# Patient Record
Sex: Female | Born: 1945
Health system: Southern US, Community
[De-identification: ages and names within clinical notes are randomized; demographics above are authoritative.]

## PROBLEM LIST (undated history)

## (undated) DIAGNOSIS — H9319 Tinnitus, unspecified ear: Secondary | ICD-10-CM

## (undated) DIAGNOSIS — I350 Nonrheumatic aortic (valve) stenosis: Secondary | ICD-10-CM

## (undated) DIAGNOSIS — E785 Hyperlipidemia, unspecified: Secondary | ICD-10-CM

## (undated) DIAGNOSIS — E049 Nontoxic goiter, unspecified: Secondary | ICD-10-CM

## (undated) DIAGNOSIS — K219 Gastro-esophageal reflux disease without esophagitis: Secondary | ICD-10-CM

## (undated) DIAGNOSIS — G4733 Obstructive sleep apnea (adult) (pediatric): Secondary | ICD-10-CM

## (undated) DIAGNOSIS — M170 Bilateral primary osteoarthritis of knee: Secondary | ICD-10-CM

## (undated) DIAGNOSIS — I2510 Atherosclerotic heart disease of native coronary artery without angina pectoris: Secondary | ICD-10-CM

## (undated) HISTORY — PX: DILATION AND CURETTAGE OF UTERUS: SHX78

## (undated) HISTORY — DX: Bilateral primary osteoarthritis of knee: M17.0

## (undated) HISTORY — DX: Hyperlipidemia, unspecified: E78.5

## (undated) HISTORY — DX: Obstructive sleep apnea (adult) (pediatric): G47.33

## (undated) HISTORY — PX: TONSILLECTOMY: SUR1361

---

## 2012-06-19 DIAGNOSIS — Z23 Encounter for immunization: Secondary | ICD-10-CM | POA: Diagnosis not present

## 2012-08-11 DIAGNOSIS — I10 Essential (primary) hypertension: Secondary | ICD-10-CM | POA: Diagnosis not present

## 2012-08-11 DIAGNOSIS — S4980XA Other specified injuries of shoulder and upper arm, unspecified arm, initial encounter: Secondary | ICD-10-CM | POA: Diagnosis not present

## 2012-08-11 DIAGNOSIS — M25519 Pain in unspecified shoulder: Secondary | ICD-10-CM | POA: Diagnosis not present

## 2012-08-11 DIAGNOSIS — E789 Disorder of lipoprotein metabolism, unspecified: Secondary | ICD-10-CM | POA: Diagnosis not present

## 2012-08-12 DIAGNOSIS — M19019 Primary osteoarthritis, unspecified shoulder: Secondary | ICD-10-CM | POA: Diagnosis not present

## 2012-08-12 DIAGNOSIS — M1710 Unilateral primary osteoarthritis, unspecified knee: Secondary | ICD-10-CM | POA: Diagnosis not present

## 2012-08-12 DIAGNOSIS — M25519 Pain in unspecified shoulder: Secondary | ICD-10-CM | POA: Diagnosis not present

## 2012-08-12 DIAGNOSIS — M25569 Pain in unspecified knee: Secondary | ICD-10-CM | POA: Diagnosis not present

## 2012-09-08 DIAGNOSIS — IMO0002 Reserved for concepts with insufficient information to code with codable children: Secondary | ICD-10-CM | POA: Diagnosis not present

## 2012-09-18 DIAGNOSIS — H2510 Age-related nuclear cataract, unspecified eye: Secondary | ICD-10-CM | POA: Diagnosis not present

## 2012-09-18 DIAGNOSIS — H5210 Myopia, unspecified eye: Secondary | ICD-10-CM | POA: Diagnosis not present

## 2012-09-18 DIAGNOSIS — H52229 Regular astigmatism, unspecified eye: Secondary | ICD-10-CM | POA: Diagnosis not present

## 2012-09-18 DIAGNOSIS — H43819 Vitreous degeneration, unspecified eye: Secondary | ICD-10-CM | POA: Diagnosis not present

## 2012-09-18 DIAGNOSIS — M67919 Unspecified disorder of synovium and tendon, unspecified shoulder: Secondary | ICD-10-CM | POA: Diagnosis not present

## 2012-09-18 DIAGNOSIS — M719 Bursopathy, unspecified: Secondary | ICD-10-CM | POA: Diagnosis not present

## 2012-09-18 DIAGNOSIS — S40019A Contusion of unspecified shoulder, initial encounter: Secondary | ICD-10-CM | POA: Diagnosis not present

## 2012-11-01 DIAGNOSIS — M67919 Unspecified disorder of synovium and tendon, unspecified shoulder: Secondary | ICD-10-CM | POA: Diagnosis not present

## 2013-01-08 DIAGNOSIS — M67919 Unspecified disorder of synovium and tendon, unspecified shoulder: Secondary | ICD-10-CM | POA: Diagnosis not present

## 2013-01-13 DIAGNOSIS — M67919 Unspecified disorder of synovium and tendon, unspecified shoulder: Secondary | ICD-10-CM | POA: Diagnosis not present

## 2013-01-20 DIAGNOSIS — M719 Bursopathy, unspecified: Secondary | ICD-10-CM | POA: Diagnosis not present

## 2013-01-22 DIAGNOSIS — M719 Bursopathy, unspecified: Secondary | ICD-10-CM | POA: Diagnosis not present

## 2013-01-27 DIAGNOSIS — M67919 Unspecified disorder of synovium and tendon, unspecified shoulder: Secondary | ICD-10-CM | POA: Diagnosis not present

## 2013-01-29 DIAGNOSIS — M67919 Unspecified disorder of synovium and tendon, unspecified shoulder: Secondary | ICD-10-CM | POA: Diagnosis not present

## 2013-02-01 DIAGNOSIS — M67919 Unspecified disorder of synovium and tendon, unspecified shoulder: Secondary | ICD-10-CM | POA: Diagnosis not present

## 2013-02-13 DIAGNOSIS — S40019A Contusion of unspecified shoulder, initial encounter: Secondary | ICD-10-CM | POA: Diagnosis not present

## 2013-02-13 DIAGNOSIS — M719 Bursopathy, unspecified: Secondary | ICD-10-CM | POA: Diagnosis not present

## 2013-03-03 DIAGNOSIS — M719 Bursopathy, unspecified: Secondary | ICD-10-CM | POA: Diagnosis not present

## 2013-03-05 DIAGNOSIS — M719 Bursopathy, unspecified: Secondary | ICD-10-CM | POA: Diagnosis not present

## 2013-03-10 DIAGNOSIS — M7660 Achilles tendinitis, unspecified leg: Secondary | ICD-10-CM | POA: Diagnosis not present

## 2013-03-10 DIAGNOSIS — G4730 Sleep apnea, unspecified: Secondary | ICD-10-CM | POA: Diagnosis not present

## 2013-04-09 ENCOUNTER — Encounter: Payer: Self-pay | Admitting: Pulmonary Disease

## 2013-04-09 ENCOUNTER — Ambulatory Visit (INDEPENDENT_AMBULATORY_CARE_PROVIDER_SITE_OTHER): Payer: Medicare Other | Admitting: Pulmonary Disease

## 2013-04-09 VITALS — BP 130/74 | HR 83 | Temp 98.1°F | Ht 61.0 in | Wt 259.6 lb

## 2013-04-09 DIAGNOSIS — G4733 Obstructive sleep apnea (adult) (pediatric): Secondary | ICD-10-CM | POA: Insufficient documentation

## 2013-04-09 DIAGNOSIS — M67919 Unspecified disorder of synovium and tendon, unspecified shoulder: Secondary | ICD-10-CM | POA: Diagnosis not present

## 2013-04-09 DIAGNOSIS — M719 Bursopathy, unspecified: Secondary | ICD-10-CM | POA: Diagnosis not present

## 2013-04-09 DIAGNOSIS — M7660 Achilles tendinitis, unspecified leg: Secondary | ICD-10-CM | POA: Diagnosis not present

## 2013-04-09 NOTE — Progress Notes (Signed)
Subjective:    Patient ID: Debra Knight, female    DOB: [birth year], 67 y.o.   MRN: [id number]  HPI PCP - Judge Stall  67 year old woman presents for evaluation of obstructive sleep apnea. She is just moved from [place] to [facility]  about 3 years ago. Husband reports loud snoring worse over the last 3 years. She has always been a mouth breather. She reports excessive daytime napping.. She states that she has never enjoyed sleeping and does not feel rested lately.  Epworth sleepiness score is 13/24 and she report sleepiness as a passenger in a car for while watching TV. Bedtime is anywhere from 11 PM to 1 AM. The TV stays on through the night. Sleep latency is about an hour, she sleeps at the back rest and a memory foam pillow. The dog also sleeps on their bed. She has frequent nocturnal awakenings including bathroom visits x2 for nocturia. She is often up to 47 but stays in bed until 9 and finally is out of bed feeling tired with an occasional headache. There is no history suggestive of cataplexy, sleep paralysis or parasomnias She drinks decaffeinated sodas. Home study showed total sleep time of 4.5 hours with AHI of 34 events per hour and lowest desaturation of 72%  Past Medical History  Diagnosis Date  . Hyperlipidemia     No past surgical history on file.  Allergies  Allergen Reactions  . Cortisone     Adverse reaction  . Demerol (Meperidine)     vomiting  . Penicillins     Tongue swelling    History   Social History  . Marital Status: Married    Spouse Name: N/A    Number of Children: N/A  . Years of Education: N/A   Occupational History  . self employed    Social History Main Topics  . Smoking status: Former Smoker -- 2.00 packs/day for 15 years    Quit date: 09/03/1968  . Smokeless tobacco: Not on file     Comment: 1-2 ppd.   . Alcohol Use: No  . Drug Use: No  . Sexually Active: Not on file   Other Topics Concern  . Not on file   Social History Narrative   . No narrative on file    Family History  Problem Relation Age of Onset  . Prostate cancer Father   . Heart disease Mother   . Diabetes Maternal Grandfather   . Diabetes Maternal Uncle        Review of Systems  Constitutional: Negative for fever and unexpected weight change.  HENT: Positive for congestion. Negative for ear pain, nosebleeds, sore throat, rhinorrhea, sneezing, trouble swallowing, dental problem, postnasal drip and sinus pressure.   Eyes: Negative for redness and itching.  Respiratory: Negative for cough, chest tightness, shortness of breath and wheezing.   Cardiovascular: Negative for palpitations and leg swelling.  Gastrointestinal: Negative for nausea and vomiting.  Genitourinary: Negative for dysuria.  Musculoskeletal: Positive for arthralgias. Negative for joint swelling.  Skin: Negative for rash.  Neurological: Negative for headaches.  Hematological: Does not bruise/bleed easily.  Psychiatric/Behavioral: Negative for dysphoric mood. The patient is not nervous/anxious.        Objective:   Physical Exam  Gen. Pleasant, obese, in no distress, normal affect ENT - no lesions, no post nasal drip, class 2-3 airway Neck: No JVD, no thyromegaly, no carotid bruits Lungs: no use of accessory muscles, no dullness to percussion, decreased without rales or rhonchi  Cardiovascular: Rhythm regular,  heart sounds  normal, ESM 2/6 at base, no peripheral edema Abdomen: soft and non-tender, no hepatosplenomegaly, BS normal. Musculoskeletal: No deformities, no cyanosis or clubbing Neuro:  alert, non focal, no tremors        Assessment & Plan:

## 2013-04-09 NOTE — Patient Instructions (Addendum)
CPAP titration study We will set you up with CPAP soon after

## 2013-04-09 NOTE — Assessment & Plan Note (Signed)
Given the severity of events noted on home sleep study, she seems to have severe obstructive sleep apnea This is consistent with her excessive daytime somnolence, narrow pharyngeal exam, witnessed apneas & loud snoring. The pathophysiology of obstructive sleep apnea , it's cardiovascular consequences & modes of treatment including CPAP were discused with the patient in detail & they evidenced understanding. CPAP titration study will be scheduled, she will likely not tolerate a full face mask but is agreeable to use a nasal mask or nasal pillows.

## 2013-04-21 DIAGNOSIS — M79609 Pain in unspecified limb: Secondary | ICD-10-CM | POA: Diagnosis not present

## 2013-04-21 DIAGNOSIS — R609 Edema, unspecified: Secondary | ICD-10-CM | POA: Diagnosis not present

## 2013-04-22 DIAGNOSIS — S838X9A Sprain of other specified parts of unspecified knee, initial encounter: Secondary | ICD-10-CM | POA: Diagnosis not present

## 2013-05-13 ENCOUNTER — Ambulatory Visit (HOSPITAL_BASED_OUTPATIENT_CLINIC_OR_DEPARTMENT_OTHER): Payer: Medicare Other | Attending: Pulmonary Disease | Admitting: Radiology

## 2013-05-13 VITALS — Ht 61.0 in | Wt 250.0 lb

## 2013-05-13 DIAGNOSIS — R0609 Other forms of dyspnea: Secondary | ICD-10-CM | POA: Diagnosis not present

## 2013-05-13 DIAGNOSIS — G4733 Obstructive sleep apnea (adult) (pediatric): Secondary | ICD-10-CM

## 2013-05-13 DIAGNOSIS — R0989 Other specified symptoms and signs involving the circulatory and respiratory systems: Secondary | ICD-10-CM | POA: Insufficient documentation

## 2013-05-19 ENCOUNTER — Encounter (HOSPITAL_BASED_OUTPATIENT_CLINIC_OR_DEPARTMENT_OTHER): Payer: Medicare Other

## 2013-05-21 DIAGNOSIS — G4730 Sleep apnea, unspecified: Secondary | ICD-10-CM

## 2013-05-21 DIAGNOSIS — G4710 Hypersomnia, unspecified: Secondary | ICD-10-CM

## 2013-05-25 ENCOUNTER — Telehealth: Payer: Self-pay | Admitting: Pulmonary Disease

## 2013-05-25 DIAGNOSIS — G4733 Obstructive sleep apnea (adult) (pediatric): Secondary | ICD-10-CM

## 2013-05-25 NOTE — Telephone Encounter (Signed)
Titration showed CPAP 17cm, EPR 3, humidity, download in Rx sent to DME

## 2013-05-26 NOTE — Telephone Encounter (Signed)
I spoke with patient about results and she verbalized understanding and had no questions 

## 2013-05-26 NOTE — Procedures (Signed)
[name], Debra Knight               ACCOUNT NO.:  [id number]  MEDICAL RECORD NO.:  [id number]          PATIENT TYPE:  OUT  LOCATION:  SLEEP CENTER                 FACILITY:  [facility]  PHYSICIAN:  Oretha Milch, MD      DATE OF BIRTH:  1945-10-31  DATE OF STUDY:  05/13/2013                           NOCTURNAL POLYSOMNOGRAM  REFERRING PHYSICIAN:  Oretha Milch, MD  INDICATION FOR STUDY:  Debra Knight is a 67 year old woman with loud snoring and excessive daytime somnolence.  A home study showed AHI of 34 events per hour.  The lowest desaturation of 72%.  At the time of this study, she weighed 250 pounds with a height of 5 feet 1 inch, BMI of 47, neck size of 15 inches.  Epworth sleepiness score of 15.  This CPAP titration study was performed with a sleep technologist in attendance.  EEG, EOG, EMG, EKG, and respiratory parameters were recorded.  Sleep stages, arousals, limb movements, and respiratory data were scored according to criteria laid out by the American Academy of Sleep Medicine.  SLEEP ARCHITECTURE:  Lights out was at 11:31 p.m., lights on was at 5:21 a.m.  Total sleep time was 261 minutes with a sleep period time of 330 minutes and sleep efficiency of 75%.  Sleep latency was 20 minutes. Latency to REM sleep was 84 minutes and wake after sleep onset was 68 minutes.  Sleep stages of the percentage of total sleep time was N1 6%, N2 71%, N3 5%, and REM sleep 46 minutes (17.6%), supine REM sleep was noted for 22 minutes.  Longest period of REM sleep was around 4:30 a.m.  RESPIRATORY DATA:  CPAP was initiated at 5 cm and increased to a final pressure of 17 cm with an EPR of 3 cm.  Pressure was increased for respiratory events and audible snoring at a level of 15 cm for 72 minutes including 23 minutes of REM sleep, 1 central apnea, and 6 hypopneas were noted with an AHI of 6 events per hour.  Lowest desaturation of 88%.  At a final level of 17 cm for 27 minutes of non- REM sleep, 2  central apneas and 2 hypopneas were noted with a desaturation of 92%.  This appears to be the optimal level used during the study.  No REM supine sleep was noted at the final level.  AROUSAL DATA:  There were 66 arousals with an arousal index of 15 events per hour.  Of these 30 were spontaneous and the rest were associated with respiratory events.  LIMB MOVEMENT DATA:  The PLM index was 28 events per hour.  The PLM arousal index was 1.4 events per hour.  OXYGEN SATURATION DATA:  The desaturation index was 40 events per hour. The lowest desaturation was 69%.  She spent 13 minutes with a saturation less than 88%.  CARDIAC DATA:  The low heart rate was 32 beats per minute.  The high heart rate recorded was an artifact.  No arrhythmias were noted.  DISCUSSION:  She was desensitized with a small Quattro full-face mask. No oxygen was applied.  Note that even though the recording states higher pressure, the level of 17 cm  was double checked with the sleep technologist.  IMPRESSION: 1. Moderate to severe obstructive sleep apnea with hypopneas causing     sleep fragmentation and oxygen desaturation. 2. This was corrected by CPAP of 17 cm with a small full-face mask. 3. Mild periodic limb movements were noted without significant     arousals. 4. No evidence of cardiac arrhythmias or behavioral disturbance during     sleep.  RECOMMENDATIONS: 1. CPAP can be initiated with a small full-face mask at 17 cm with an     EPR of 3 cm and compliance monitored at this level. 2. She should be cautioned against medications with sedative side     effects. 3. She should be asked to avoid driving when sleepy.     Oretha Milch, MD    RVA/MEDQ  D:  05/25/2013 11:44:28  T:  05/26/2013 01:42:23  Job:  [id number]

## 2013-06-02 ENCOUNTER — Telehealth: Payer: Self-pay | Admitting: Pulmonary Disease

## 2013-06-02 NOTE — Telephone Encounter (Signed)
Called, spoke with Marcelino Duster with [employer].  Pt had a home sleep study done. We have one scanned in pt's chart from July 2014.  Marcelino Duster is requesting we fax this to [phone].  This has been done.  Michelle aware.

## 2013-06-15 ENCOUNTER — Telehealth: Payer: Self-pay | Admitting: Pulmonary Disease

## 2013-06-15 DIAGNOSIS — G4733 Obstructive sleep apnea (adult) (pediatric): Secondary | ICD-10-CM

## 2013-06-15 NOTE — Telephone Encounter (Signed)
Change to autoCPAP 10-17, new mask ok

## 2013-06-15 NOTE — Telephone Encounter (Signed)
Spoke with patient-she is aware that I will get message to RA to advise on. Pt states her seal breaks on the mask once the pressure gets to 17 and is wondering if the machine pressure needs to be adjusted as well as getting a new mask. Pt aware that she may not get a call back until tomorrow afternoon. RA Please advise. Thanks.

## 2013-06-16 NOTE — Telephone Encounter (Signed)
I spoke with pt. She stated she is on auto. She reports she is on auto 4-17 CM. She reports she has an adjust button where she can set the timer to when the pressure to get to 17 cm. She reports once it does that's when her mask blows off her face. Pt does not believe she needs to be set at 17 cm or the auto to be up to 17 cm.  I called [facility] to see what they have pt set on. I was advised they do have her on 17 cm. Pt may be referring to her ramp settings.  Please advise Dr. Vassie Loll thanks

## 2013-06-16 NOTE — Telephone Encounter (Signed)
lmomtcb x1 for pt 

## 2013-06-16 NOTE — Telephone Encounter (Signed)
So change to auto 10-15 cm, ramp x 15 mins, download & FU in 6 wks

## 2013-06-17 NOTE — Telephone Encounter (Signed)
I spoke with pt and aware of recs. She will call back for the appt. Nothing further needed

## 2013-06-17 NOTE — Telephone Encounter (Signed)
Returning call.Debra Knight ° °

## 2013-07-09 ENCOUNTER — Encounter: Payer: Self-pay | Admitting: Pulmonary Disease

## 2013-08-20 ENCOUNTER — Encounter (INDEPENDENT_AMBULATORY_CARE_PROVIDER_SITE_OTHER): Payer: Self-pay

## 2013-08-20 ENCOUNTER — Ambulatory Visit (INDEPENDENT_AMBULATORY_CARE_PROVIDER_SITE_OTHER): Payer: Medicare Other | Admitting: Pulmonary Disease

## 2013-08-20 ENCOUNTER — Encounter: Payer: Self-pay | Admitting: Pulmonary Disease

## 2013-08-20 VITALS — BP 126/72 | HR 77 | Temp 97.7°F | Ht 61.0 in | Wt 250.2 lb

## 2013-08-20 DIAGNOSIS — G4733 Obstructive sleep apnea (adult) (pediatric): Secondary | ICD-10-CM

## 2013-08-20 NOTE — Patient Instructions (Signed)
CPAP is effective- controls events, better rest, no snoring Step 1  -Use alternative mask that you have Step 2 - call us in 2 wks , if no better, will schedule mask fit Step 3- If above does not work, will try nasal mask

## 2013-08-20 NOTE — Assessment & Plan Note (Signed)
CPAP is effective- controls events, better rest, no snoring Step 1  -Use alternative mask that you have Step 2 - call us in 2 wks , if no better, will schedule mask fit Step 3- If above does not work, will try nasal mask  Weight loss encouraged, compliance with goal of at least 4-6 hrs every night is the expectation. Advised against medications with sedative side effects Cautioned against driving when sleepy - understanding that sleepiness will vary on a day to day basis

## 2013-08-20 NOTE — Progress Notes (Signed)
   Subjective:    Patient ID: Debra Knight, female    DOB: [birth year], 67 y.o.   MRN: [id number]  HPI  PCP - Judge Stall   67 year old woman presents for FU of obstructive sleep apnea.  She is just moved from [place] to [facility] about 3 years ago.  Husband reports loud snoring worse over the last 3 years. She has always been a mouth breather. She reports excessive daytime napping.. She states that she has never enjoyed sleeping and does not feel rested lately.  Epworth sleepiness score is 13/24 and she report sleepiness as a passenger in a car for while watching TV.  Bedtime is anywhere from 11 PM to 1 AM. The TV stays on through the night. Sleep latency is about an hour, she sleeps at the back rest and a memory foam pillow. The dog also sleeps on their bed. She has frequent nocturnal awakenings including bathroom visits x2 for nocturia. She is often up to 47 but stays in bed until 9 and finally is out of bed feeling tired with an occasional headache.  She drinks decaffeinated sodas.  Home study showed total sleep time of 4.5 hours with AHI of 34 events per hour and lowest desaturation of 72% Titration showed CPAP 17cm, EPR 3, changed to auto 10-15 cm due to high pressure,  Download on auto 10-15 cm, no residuals, leak ok, compliance good > 4h  She is very uncomfortable & has numerous adjustment issues, small mask not great fit, air blows into her eyes, mouth breather,    better rest, no snoring per husbnad   Review of Systems neg for any significant sore throat, dysphagia, itching, sneezing, nasal congestion or excess/ purulent secretions, fever, chills, sweats, unintended wt loss, pleuritic or exertional cp, hempoptysis, orthopnea pnd or change in chronic leg swelling. Also denies presyncope, palpitations, heartburn, abdominal pain, nausea, vomiting, diarrhea or change in bowel or urinary habits, dysuria,hematuria, rash, arthralgias, visual complaints, headache, numbness weakness or  ataxia.     Objective:   Physical Exam  Gen. Pleasant, obese, in no distress ENT - no lesions, no post nasal drip Neck: No JVD, no thyromegaly, no carotid bruits Lungs: no use of accessory muscles, no dullness to percussion, decreased without rales or rhonchi  Cardiovascular: Rhythm regular, heart sounds  normal, no murmurs or gallops, no peripheral edema Musculoskeletal: No deformities, no cyanosis or clubbing , no tremors       Assessment & Plan:

## 2013-09-03 HISTORY — PX: BIOPSY THYROID: PRO38

## 2013-09-08 ENCOUNTER — Encounter: Payer: Self-pay | Admitting: Cardiovascular Disease

## 2013-09-08 ENCOUNTER — Encounter: Payer: Self-pay | Admitting: *Deleted

## 2013-09-08 DIAGNOSIS — E785 Hyperlipidemia, unspecified: Secondary | ICD-10-CM | POA: Insufficient documentation

## 2013-09-11 ENCOUNTER — Encounter: Payer: Self-pay | Admitting: Cardiovascular Disease

## 2013-09-11 ENCOUNTER — Ambulatory Visit (INDEPENDENT_AMBULATORY_CARE_PROVIDER_SITE_OTHER): Payer: Medicare Other | Admitting: Cardiovascular Disease

## 2013-09-11 VITALS — BP 166/85 | HR 77 | Ht 61.0 in | Wt 250.0 lb

## 2013-09-11 DIAGNOSIS — I359 Nonrheumatic aortic valve disorder, unspecified: Secondary | ICD-10-CM

## 2013-09-11 DIAGNOSIS — I10 Essential (primary) hypertension: Secondary | ICD-10-CM | POA: Insufficient documentation

## 2013-09-11 DIAGNOSIS — R0989 Other specified symptoms and signs involving the circulatory and respiratory systems: Secondary | ICD-10-CM | POA: Diagnosis not present

## 2013-09-11 DIAGNOSIS — I350 Nonrheumatic aortic (valve) stenosis: Secondary | ICD-10-CM | POA: Insufficient documentation

## 2013-09-11 DIAGNOSIS — G4733 Obstructive sleep apnea (adult) (pediatric): Secondary | ICD-10-CM

## 2013-09-11 DIAGNOSIS — E785 Hyperlipidemia, unspecified: Secondary | ICD-10-CM

## 2013-09-11 DIAGNOSIS — Z0000 Encounter for general adult medical examination without abnormal findings: Secondary | ICD-10-CM

## 2013-09-11 LAB — LIPID PANEL
CHOLESTEROL: 310 mg/dL — AB (ref 0–200)
HDL: 40 mg/dL (ref >39.00)
Total CHOL/HDL Ratio: 8
Triglycerides: 260 mg/dL — ABNORMAL HIGH (ref 0.0–149.0)
VLDL: 52 mg/dL — ABNORMAL HIGH (ref 0.0–40.0)

## 2013-09-11 LAB — LDL CHOLESTEROL, DIRECT: Direct LDL: 222.9 mg/dL

## 2013-09-11 NOTE — Assessment & Plan Note (Signed)
Cannot tell if it is AS murmur radiating to neck or not  Check carotid duplex  ASA

## 2013-09-11 NOTE — Patient Instructions (Signed)
Your physician wants you to follow-up in:  [place] will receive a reminder letter in the mail two months in advance. If you don't receive a letter, please call our office to schedule the follow-up appointment. Your physician recommends that you continue on your current medications as directed. Please refer to the Current Medication list given to you today. Your physician recommends that you return for lab work in:  [place] physician has requested that you have a carotid duplex. This test is an ultrasound of the carotid arteries in your neck. It looks at blood flow through these arteries that supply the brain with blood. Allow one hour for this exam. There are no restrictions or special instructions.   Your physician has requested that you have an echocardiogram. Echocardiography is a painless test that uses sound waves to create images of your heart. It provides your doctor with information about the size and shape of your heart and how well your heart's chambers and valves are working. This procedure takes approximately one hour. There are no restrictions for this procedure.

## 2013-09-11 NOTE — Assessment & Plan Note (Signed)
No mention of and not on meds.  Will assess LVH on echo and take home readings  Will likely need to start meds.  Probably avoid ACE with AS  Likely need diuretic

## 2013-09-11 NOTE — Assessment & Plan Note (Signed)
Will check labs today With AS some studies suggest tight lipiid control slows progression of disease

## 2013-09-11 NOTE — Assessment & Plan Note (Signed)
Has seen Dr Elsworth Soho and had mask fitted Discussed cardiac benefits of using

## 2013-09-11 NOTE — Progress Notes (Signed)
Patient ID: Debra Knight, female   DOB: [birth year], 68 y.o.   MRN: [id number]   67 yo from [place].  Relocated here with husband.  Concerned about murmur  She is overweight and has bilateral arthritis in knees.  Needs to find a medical doctor and orthopedic doctor.  Denies having echo before No chest pain.  Activity limited by leg pain rather than dyspnea chest pain or palpitations Takes ASA for stroke prevention.        ROS: Denies fever, malais, weight loss, blurry vision, decreased visual acuity, cough, sputum, SOB, hemoptysis, pleuritic pain, palpitaitons, heartburn, abdominal pain, melena, lower extremity edema, claudication, or rash.  All other systems reviewed and negative   General: Affect appropriate Obese white female  HEENT: normal Neck supple with no adenopathy JVP normal no bruits no thyromegaly Lungs clear with no wheezing and good diaphragmatic motion Heart:  S1/S2 midl to moderate AS  murmur,rub, gallop or click PMI normal Abdomen: benighn, BS positve, no tenderness, no AAA no bruit.  No HSM or HJR Distal pulses intact with no bruits No edema Neuro non-focal Skin warm and dry No muscular weakness  Medications Current Outpatient Prescriptions  Medication Sig Dispense Refill  . aspirin 81 MG tablet 2 at bedtime      . BIOTIN PO Take 1 tablet by mouth daily.      . Glucosamine-Chondroit-Vit C-Mn (GLUCOSAMINE 1500 COMPLEX PO) Take 1 tablet by mouth 2 (two) times daily.      . Multiple Vitamin (MULTIVITAMIN) tablet Take 1 tablet by mouth daily.      . naproxen sodium (ANAPROX) 220 MG tablet Take 220 mg by mouth 2 (two) times daily.       No current facility-administered medications for this visit.    Allergies Cortisone; Demerol; and Penicillins  Family History: Family History  Problem Relation Age of Onset  . Prostate cancer Father   . Heart disease Mother   . Diabetes Maternal Grandfather   . Diabetes Maternal Uncle     Social History: History    Social History  . Marital Status: Married    Spouse Name: N/A    Number of Children: N/A  . Years of Education: N/A   Occupational History  . self employed    Social History Main Topics  . Smoking status: Former Smoker -- 2.00 packs/day for 15 years    Quit date: 09/03/1968  . Smokeless tobacco: Not on file     Comment: 1-2 ppd.   . Alcohol Use: No  . Drug Use: No  . Sexual Activity: Not on file   Other Topics Concern  . Not on file   Social History Narrative  . No narrative on file    Electrocardiogram:  SR rate 77 normal   Assessment and Plan

## 2013-09-11 NOTE — Assessment & Plan Note (Signed)
Clinically not severe  F/U echo

## 2013-09-14 ENCOUNTER — Other Ambulatory Visit: Payer: Self-pay | Admitting: *Deleted

## 2013-09-14 MED ORDER — ATORVASTATIN CALCIUM 20 MG PO TABS: 20.0000 mg | ORAL_TABLET | Freq: Every day | ORAL | Status: DC

## 2013-09-29 ENCOUNTER — Ambulatory Visit (HOSPITAL_BASED_OUTPATIENT_CLINIC_OR_DEPARTMENT_OTHER): Payer: Medicare Other

## 2013-09-29 ENCOUNTER — Encounter: Payer: Self-pay | Admitting: Cardiology

## 2013-09-29 ENCOUNTER — Ambulatory Visit (HOSPITAL_COMMUNITY): Payer: Medicare Other | Attending: Cardiovascular Disease | Admitting: Radiology

## 2013-09-29 DIAGNOSIS — E785 Hyperlipidemia, unspecified: Secondary | ICD-10-CM | POA: Diagnosis not present

## 2013-09-29 DIAGNOSIS — I10 Essential (primary) hypertension: Secondary | ICD-10-CM | POA: Insufficient documentation

## 2013-09-29 DIAGNOSIS — Z87891 Personal history of nicotine dependence: Secondary | ICD-10-CM | POA: Diagnosis not present

## 2013-09-29 DIAGNOSIS — I079 Rheumatic tricuspid valve disease, unspecified: Secondary | ICD-10-CM | POA: Insufficient documentation

## 2013-09-29 DIAGNOSIS — R0989 Other specified symptoms and signs involving the circulatory and respiratory systems: Secondary | ICD-10-CM

## 2013-09-29 DIAGNOSIS — I359 Nonrheumatic aortic valve disorder, unspecified: Secondary | ICD-10-CM | POA: Insufficient documentation

## 2013-09-29 DIAGNOSIS — Z6841 Body Mass Index (BMI) 40.0 and over, adult: Secondary | ICD-10-CM | POA: Diagnosis not present

## 2013-09-29 DIAGNOSIS — I350 Nonrheumatic aortic (valve) stenosis: Secondary | ICD-10-CM

## 2013-09-29 DIAGNOSIS — E669 Obesity, unspecified: Secondary | ICD-10-CM | POA: Insufficient documentation

## 2013-09-29 NOTE — Progress Notes (Signed)
Echocardiogram performed.  

## 2013-09-30 ENCOUNTER — Telehealth: Payer: Self-pay | Admitting: Cardiovascular Disease

## 2013-09-30 DIAGNOSIS — R9389 Abnormal findings on diagnostic imaging of other specified body structures: Secondary | ICD-10-CM

## 2013-09-30 NOTE — Telephone Encounter (Signed)
F/u ° ° °Pt returning your call °

## 2013-09-30 NOTE — Telephone Encounter (Signed)
PT AWARE OF  CAROTID  AND  ECHO  RESULTS   NEEDS  U/S OF THYROID   WILL  CALL  DR  LESCHBER'S OFFICE  TO MAKE  ARRANGEMENTS .Adonis Housekeeper

## 2013-09-30 NOTE — Telephone Encounter (Signed)
New problem ° ° °Pt returning your call. °

## 2013-10-01 ENCOUNTER — Telehealth: Payer: Self-pay | Admitting: Internal Medicine

## 2013-10-01 NOTE — Telephone Encounter (Signed)
Ok to work into a 40" slot thanks

## 2013-10-01 NOTE — Telephone Encounter (Signed)
Pt called stated that Debra Knight found abnormal in her thyroid; so he recommend for Debra Knight to see Debra Knight and get the ultrasound of her thyroid. Pt request to be work in for new pt for this problem. Please advise.

## 2013-10-02 NOTE — Telephone Encounter (Signed)
Left vm for pt to callback 

## 2013-10-02 NOTE — Telephone Encounter (Signed)
Pt has an appt schedule 2.2.15

## 2013-10-02 NOTE — Telephone Encounter (Signed)
PT  HAS APPT   ON  10-05-13 WITH  DR Asa Lente .Adonis Housekeeper

## 2013-10-05 ENCOUNTER — Other Ambulatory Visit (INDEPENDENT_AMBULATORY_CARE_PROVIDER_SITE_OTHER): Payer: Medicare Other

## 2013-10-05 ENCOUNTER — Ambulatory Visit (INDEPENDENT_AMBULATORY_CARE_PROVIDER_SITE_OTHER): Payer: Medicare Other | Admitting: Internal Medicine

## 2013-10-05 ENCOUNTER — Ambulatory Visit
Admission: RE | Admit: 2013-10-05 | Discharge: 2013-10-05 | Disposition: A | Discharge status: Home / Self Care | Payer: Medicare Other | Source: Ambulatory Visit | Attending: Internal Medicine | Admitting: Internal Medicine

## 2013-10-05 ENCOUNTER — Encounter: Payer: Self-pay | Admitting: Nurse Practitioner

## 2013-10-05 VITALS — BP 130/82 | HR 84 | Temp 97.8°F | Ht 61.0 in | Wt 249.8 lb

## 2013-10-05 DIAGNOSIS — E785 Hyperlipidemia, unspecified: Secondary | ICD-10-CM

## 2013-10-05 DIAGNOSIS — IMO0002 Reserved for concepts with insufficient information to code with codable children: Secondary | ICD-10-CM

## 2013-10-05 DIAGNOSIS — G4733 Obstructive sleep apnea (adult) (pediatric): Secondary | ICD-10-CM

## 2013-10-05 DIAGNOSIS — I350 Nonrheumatic aortic (valve) stenosis: Secondary | ICD-10-CM

## 2013-10-05 DIAGNOSIS — M170 Bilateral primary osteoarthritis of knee: Secondary | ICD-10-CM

## 2013-10-05 DIAGNOSIS — E079 Disorder of thyroid, unspecified: Secondary | ICD-10-CM

## 2013-10-05 DIAGNOSIS — M1710 Unilateral primary osteoarthritis, unspecified knee: Secondary | ICD-10-CM | POA: Diagnosis not present

## 2013-10-05 DIAGNOSIS — I10 Essential (primary) hypertension: Secondary | ICD-10-CM | POA: Diagnosis not present

## 2013-10-05 DIAGNOSIS — I359 Nonrheumatic aortic valve disorder, unspecified: Secondary | ICD-10-CM

## 2013-10-05 DIAGNOSIS — R946 Abnormal results of thyroid function studies: Secondary | ICD-10-CM

## 2013-10-05 DIAGNOSIS — E041 Nontoxic single thyroid nodule: Secondary | ICD-10-CM | POA: Diagnosis not present

## 2013-10-05 LAB — CBC WITH DIFFERENTIAL/PLATELET
BASOS PCT: 0.6 % (ref 0.0–3.0)
Basophils Absolute: 0.1 10*3/uL (ref 0.0–0.1)
EOS ABS: 0.1 10*3/uL (ref 0.0–0.7)
Eosinophils Relative: 1.1 % (ref 0.0–5.0)
HCT: 44.8 % (ref 36.0–46.0)
Hemoglobin: 14.6 g/dL (ref 12.0–15.0)
LYMPHS ABS: 2.8 10*3/uL (ref 0.7–4.0)
Lymphocytes Relative: 25.4 % (ref 12.0–46.0)
MCHC: 32.7 g/dL (ref 30.0–36.0)
MCV: 88.7 fl (ref 78.0–100.0)
MONO ABS: 0.5 10*3/uL (ref 0.1–1.0)
Monocytes Relative: 4.3 % (ref 3.0–12.0)
NEUTROS PCT: 68.6 % (ref 43.0–77.0)
Neutro Abs: 7.7 10*3/uL (ref 1.4–7.7)
Platelets: 282 10*3/uL (ref 150.0–400.0)
RBC: 5.05 Mil/uL (ref 3.87–5.11)
RDW: 14.3 % (ref 11.5–14.6)
WBC: 11.2 10*3/uL — ABNORMAL HIGH (ref 4.5–10.5)

## 2013-10-05 LAB — BASIC METABOLIC PANEL
BUN: 16 mg/dL (ref 6–23)
CO2: 29 meq/L (ref 19–32)
CREATININE: 0.6 mg/dL (ref 0.4–1.2)
Calcium: 9.6 mg/dL (ref 8.4–10.5)
Chloride: 102 mEq/L (ref 96–112)
GFR: 103.47 mL/min (ref >60.00)
Glucose, Bld: 118 mg/dL — ABNORMAL HIGH (ref 70–99)
Potassium: 4.3 mEq/L (ref 3.5–5.1)
Sodium: 140 mEq/L (ref 135–145)

## 2013-10-05 LAB — HEPATIC FUNCTION PANEL
ALBUMIN: 4.3 g/dL (ref 3.5–5.2)
ALT: 27 U/L (ref 0–35)
AST: 17 U/L (ref 0–37)
Alkaline Phosphatase: 131 U/L — ABNORMAL HIGH (ref 39–117)
Bilirubin, Direct: 0.1 mg/dL (ref 0.0–0.3)
TOTAL PROTEIN: 7.8 g/dL (ref 6.0–8.3)
Total Bilirubin: 1 mg/dL (ref 0.3–1.2)

## 2013-10-05 LAB — TSH: TSH: 2.17 u[IU]/mL (ref 0.35–5.50)

## 2013-10-05 NOTE — Assessment & Plan Note (Signed)
Begin statin as advised by cardiology January 2015 -compliant with same and tolerating well Will check lipids in 3-6 months to monitor effects of treatment and titrate as needed The current medical regimen is effective;  continue present plan and medications. Also advised on the importance of diet and exercise for management of same

## 2013-10-05 NOTE — Assessment & Plan Note (Signed)
Planning orthopedic followup on same ([place])

## 2013-10-05 NOTE — Patient Instructions (Signed)
It was good to see you today.  We have reviewed your prior records including labs and tests today  Medications reviewed and updated, no changes recommended at this time.  we'll make referral for thyroid ultrasound . Our office will contact you regarding appointment(s) once made. He will then be contacted within 24 hours of business day regarding results and further test/referrals if needed  Please schedule followup in 8-12 weeks for Continued reviewed and cholesterol recheck, call sooner if problems.

## 2013-10-05 NOTE — Progress Notes (Signed)
Pre-visit discussion using our clinic review tool. No additional management support is needed unless otherwise documented below in the visit note.  

## 2013-10-05 NOTE — Progress Notes (Signed)
Subjective:    Patient ID: Debra Knight, female    DOB: Jul 22, 1946, 68 y.o.   MRN: [id number]  HPI  Patient seen today to establish care.  Moved from [place], [place] - previously followed by Dr Luana Shu in [place], [place].   Referred by Dr Johnsie Cancel for abnormal thyroid finding on carotid doppler.  Chronic medical issues also reviewed -  OSA - followed by pulmonary Elsworth Soho) for same.  Compliant with CPAP 4-5 hours per night, still having difficulty with how mask is fitting.    Aortic stenosis - moderate by echo 08/2013.  Followed by cards Johnsie Cancel).  Repeat echo scheduled 08/2014.  No chest pain or worsening dyspnea on exertion  Hyperlipidemia - found on labs 08/2013.  Started on Lipitor at that time.  Patient reports compliance with medications and no adverse events.  Trying to follow heart healthy diet.   Osteoarthritis - bilateral knees and neck.  Takes Aleve as needed for pain.  Planning eval with Alusio  Abnormal thyroid finding on carotid ultrasound - pt with no unexplained weight gain or weight loss.  No temperature sensitivity.  No recent hair or skin changes.  No difficulty swallowing or trouble breathing  Past Medical History  Diagnosis Date  . Hyperlipidemia   . OSA (obstructive sleep apnea)   . Aortic stenosis     moderate on Echo 09/2013  . Carotid bruit     LICA 84-69% 02/2951 doppler  . HTN (hypertension)   . Osteoarthritis of both knees   . Vitiligo    Family History  Problem Relation Age of Onset  . Prostate cancer Father     deceased at 1  . Heart disease Mother     deceased at 46 MI  . Diabetes Maternal Grandfather   . Diabetes Maternal Uncle   . Hashimoto's thyroiditis Daughter   . Hashimoto's thyroiditis Son    History   Social History  . Marital Status: Married    Spouse Name: N/A    Number of Children: N/A  . Years of Education: N/A   Occupational History  . self employed    Social History Main Topics  . Smoking status: Former Smoker -- 2.00  packs/day for 15 years    Quit date: 09/03/1968  . Smokeless tobacco: Not on file     Comment: 1-2 ppd.   . Alcohol Use: No  . Drug Use: No  . Sexual Activity: Not on file   Other Topics Concern  . Not on file   Social History Narrative  . No narrative on file     Review of Systems  Constitutional: Negative for fever, chills, activity change and appetite change.  HENT: Negative for congestion, nosebleeds, postnasal drip, rhinorrhea, sinus pressure and sore throat.   Eyes: Negative for pain, discharge and redness.  Respiratory: Negative for chest tightness, shortness of breath and wheezing.   Cardiovascular: Negative for chest pain, palpitations and leg swelling.  Gastrointestinal: Negative for nausea, vomiting, diarrhea and constipation.  Endocrine: Negative for cold intolerance, heat intolerance, polydipsia, polyphagia and polyuria.  Musculoskeletal: Positive for arthralgias (bilateral knee and neck).  Neurological: Negative for dizziness, syncope and headaches.       Objective:   Physical Exam  Vitals reviewed. Constitutional: She is oriented to person, place, and time. She appears well-nourished. She appears distressed.  Obese   HENT:  Head: Normocephalic and atraumatic.  Eyes: Conjunctivae are normal. Pupils are equal, round, and reactive to light. Right eye exhibits no discharge. Left  eye exhibits no discharge.  Neck: Normal range of motion. Neck supple. No thyromegaly present.  Cardiovascular: Normal rate and regular rhythm.   Murmur heard. 2/6 AS murmur  Pulmonary/Chest: Effort normal and breath sounds normal. No respiratory distress. She has no wheezes. She exhibits no tenderness.  Musculoskeletal: Normal range of motion. She exhibits no edema and no tenderness.  Lymphadenopathy:    She has no cervical adenopathy.  Neurological: She is alert and oriented to person, place, and time.  Skin: Skin is warm and dry. No rash noted. She is not diaphoretic.    Psychiatric: She has a normal mood and affect. Her behavior is normal. Judgment and thought content normal.    Wt Readings from Last 3 Encounters:  10/05/13 249 lb 12.8 oz (113.309 kg)  09/11/13 250 lb (113.399 kg)  08/20/13 250 lb 3.2 oz (113.49 kg)   BP Readings from Last 3 Encounters:  10/05/13 130/82  09/11/13 166/85  08/20/13 126/72   Lab Results  Component Value Date   CHOL 310* 09/11/2013   TRIG 260.0* 09/11/2013   HDL 40.00 09/11/2013   LDLDIRECT 222.9 09/11/2013       Assessment & Plan:   Will address annual health maintenance at follow up visit   See problem list -   Time spent with pt today 45 minutes, greater than 50% time spent counseling patient on abnormal thyroid imaging on recent carotid duplex, dyslipidemia, osteoarthritis and medication review. Also review of prior records

## 2013-10-05 NOTE — Assessment & Plan Note (Addendum)
Order soft tissue US now to follow up on abnormality seen on recent carotid US-  further testing or referrals to be determined based on these results  Report reviewed. Bilateral cysts, right mixed, left with possible calcification -recommending biopsy Discussed case with interventional radiology Dr. Kathlene Cote who agrees to proceed with biopsy and assisted with my placement of orders for same  [facility] also arrange for endocrinology evaluation to follow serially and assist as needed

## 2013-10-05 NOTE — Assessment & Plan Note (Addendum)
BP Readings from Last 3 Encounters:  10/05/13 130/82  09/11/13 166/85  08/20/13 126/72   Patient not currently on medical therapy for blood pressure control.  She is trying to follow low sodium diet.   Continue current plan.

## 2013-10-05 NOTE — Assessment & Plan Note (Signed)
History of severe disease based on home sleep study results August 2014 Working with pulmonary sleep specialist on same, continue CPAP as prescribed Reviewed importance of weight reduction and cardio benefits of CPAP compliance today

## 2013-10-05 NOTE — Assessment & Plan Note (Signed)
Echocardiogram January 2015 reviewed -moderate AS Asymptomatic, will continue to follow with cards and monitor serial echocardiogram as indicated

## 2013-10-06 ENCOUNTER — Telehealth: Payer: Self-pay | Admitting: *Deleted

## 2013-10-06 NOTE — Telephone Encounter (Signed)
lmomtcb x1 

## 2013-10-06 NOTE — Telephone Encounter (Signed)
Auto 10-15 very effective usage ok Try not to miss any nights Ct same pressure

## 2013-10-07 ENCOUNTER — Other Ambulatory Visit: Payer: Self-pay | Admitting: Internal Medicine

## 2013-10-07 DIAGNOSIS — R946 Abnormal results of thyroid function studies: Secondary | ICD-10-CM

## 2013-10-08 NOTE — Telephone Encounter (Signed)
I spoke with patient about results and she verbalized understanding and had no questions 

## 2013-10-13 ENCOUNTER — Telehealth: Payer: Self-pay | Admitting: Pulmonary Disease

## 2013-10-13 NOTE — Telephone Encounter (Signed)
I called and spoke with pt. She is requesting a referral to endodontologists. Was told she had an "irregular thyroid". She had u/s done and was told it was a cyst. PCP wants pt to have a needle BX done and referral to Dr. Loanne Drilling. She read reports on him and is not happy. She wants Dr. Elsworth Soho to refer her to someone. Please advise Dr. Elsworth Soho thanks

## 2013-10-14 ENCOUNTER — Ambulatory Visit: Payer: Medicare Other | Admitting: Endocrinology

## 2013-10-16 NOTE — Telephone Encounter (Signed)
I called and spoke with pt. Made her aware of recs. Nothing further needed

## 2013-10-16 NOTE — Telephone Encounter (Signed)
Patient calling again asking for RA to suggest an endodontologist.  [phone]

## 2013-10-16 NOTE — Telephone Encounter (Signed)
Dr Elayne Snare or Benjiman Core in the same endocrine practice

## 2013-10-20 ENCOUNTER — Inpatient Hospital Stay
Admission: RE | Admit: 2013-10-20 | Discharge: 2013-10-20 | Disposition: A | Discharge status: Home / Self Care | Payer: Medicare Other | Source: Ambulatory Visit | Attending: Internal Medicine | Admitting: Internal Medicine

## 2013-10-20 ENCOUNTER — Inpatient Hospital Stay: Admission: RE | Admit: 2013-10-20 | Payer: Medicare Other | Source: Ambulatory Visit

## 2013-10-28 ENCOUNTER — Ambulatory Visit
Admission: RE | Admit: 2013-10-28 | Discharge: 2013-10-28 | Disposition: A | Discharge status: Home / Self Care | Payer: Medicare Other | Source: Ambulatory Visit | Attending: Internal Medicine | Admitting: Internal Medicine

## 2013-10-28 ENCOUNTER — Other Ambulatory Visit (HOSPITAL_COMMUNITY)
Admission: RE | Admit: 2013-10-28 | Discharge: 2013-10-28 | Disposition: A | Discharge status: Home / Self Care | Payer: Medicare Other | Source: Ambulatory Visit | Attending: Interventional Radiology | Admitting: Interventional Radiology

## 2013-10-28 DIAGNOSIS — E042 Nontoxic multinodular goiter: Secondary | ICD-10-CM | POA: Diagnosis not present

## 2013-10-28 DIAGNOSIS — E049 Nontoxic goiter, unspecified: Secondary | ICD-10-CM | POA: Insufficient documentation

## 2013-10-28 DIAGNOSIS — R946 Abnormal results of thyroid function studies: Secondary | ICD-10-CM

## 2013-11-04 ENCOUNTER — Telehealth: Payer: Self-pay | Admitting: *Deleted

## 2013-11-04 NOTE — Telephone Encounter (Signed)
Patient phoned requesting thyroid u/s bx results (10/2013).  Please advise.  CB# [phone]

## 2013-11-04 NOTE — Telephone Encounter (Signed)
Non-cancerous goiter

## 2013-11-05 NOTE — Telephone Encounter (Signed)
Phoned patient and relayed MD's response.  States she will follow up with endocrinology (per previous documentation by PCP)

## 2013-11-10 ENCOUNTER — Ambulatory Visit (INDEPENDENT_AMBULATORY_CARE_PROVIDER_SITE_OTHER): Payer: Medicare Other | Admitting: *Deleted

## 2013-11-10 DIAGNOSIS — E785 Hyperlipidemia, unspecified: Secondary | ICD-10-CM

## 2013-11-10 LAB — LIPID PANEL
CHOL/HDL RATIO: 5
Cholesterol: 211 mg/dL — ABNORMAL HIGH (ref 0–200)
HDL: 41.1 mg/dL (ref >39.00)
LDL CALC: 132 mg/dL — AB (ref 0–99)
Triglycerides: 190 mg/dL — ABNORMAL HIGH (ref 0.0–149.0)
VLDL: 38 mg/dL (ref 0.0–40.0)

## 2013-11-12 ENCOUNTER — Other Ambulatory Visit: Payer: Medicare Other

## 2013-11-16 ENCOUNTER — Other Ambulatory Visit: Payer: Self-pay | Admitting: *Deleted

## 2013-11-16 ENCOUNTER — Telehealth: Payer: Self-pay | Admitting: Cardiovascular Disease

## 2013-11-16 DIAGNOSIS — Z79899 Other long term (current) drug therapy: Secondary | ICD-10-CM

## 2013-11-16 DIAGNOSIS — E785 Hyperlipidemia, unspecified: Secondary | ICD-10-CM

## 2013-11-16 MED ORDER — ALPRAZOLAM 0.25 MG PO TABS: 0.2500 mg | ORAL_TABLET | Freq: Every evening | ORAL | Status: DC | PRN

## 2013-11-16 MED ORDER — ATORVASTATIN CALCIUM 40 MG PO TABS: 40.0000 mg | ORAL_TABLET | Freq: Every day | ORAL | Status: DC

## 2013-11-16 NOTE — Telephone Encounter (Signed)
New message ° ° ° ° °Returning a nurses call to get lab results °

## 2013-11-16 NOTE — Telephone Encounter (Signed)
PT AWARE OF LAB RESULTS./CY 

## 2014-01-07 ENCOUNTER — Other Ambulatory Visit: Payer: Medicare Other

## 2014-02-16 ENCOUNTER — Other Ambulatory Visit: Payer: Medicare Other

## 2014-03-11 ENCOUNTER — Other Ambulatory Visit (INDEPENDENT_AMBULATORY_CARE_PROVIDER_SITE_OTHER): Payer: Medicare Other

## 2014-03-11 DIAGNOSIS — Z79899 Other long term (current) drug therapy: Secondary | ICD-10-CM

## 2014-03-11 DIAGNOSIS — M1710 Unilateral primary osteoarthritis, unspecified knee: Secondary | ICD-10-CM | POA: Diagnosis not present

## 2014-03-11 DIAGNOSIS — E785 Hyperlipidemia, unspecified: Secondary | ICD-10-CM

## 2014-03-11 LAB — LIPID PANEL
CHOL/HDL RATIO: 5
Cholesterol: 180 mg/dL (ref 0–200)
HDL: 38.1 mg/dL — ABNORMAL LOW (ref >39.00)
LDL Cholesterol: 95 mg/dL (ref 0–99)
NONHDL: 141.9
Triglycerides: 233 mg/dL — ABNORMAL HIGH (ref 0.0–149.0)
VLDL: 46.6 mg/dL — ABNORMAL HIGH (ref 0.0–40.0)

## 2014-03-11 LAB — HEPATIC FUNCTION PANEL
ALT: 30 U/L (ref 0–35)
AST: 25 U/L (ref 0–37)
Albumin: 4 g/dL (ref 3.5–5.2)
Alkaline Phosphatase: 130 U/L — ABNORMAL HIGH (ref 39–117)
BILIRUBIN TOTAL: 0.7 mg/dL (ref 0.2–1.2)
Bilirubin, Direct: 0 mg/dL (ref 0.0–0.3)
Total Protein: 7.1 g/dL (ref 6.0–8.3)

## 2014-03-16 ENCOUNTER — Ambulatory Visit: Payer: Medicare Other | Admitting: Cardiovascular Disease

## 2014-04-14 DIAGNOSIS — M1710 Unilateral primary osteoarthritis, unspecified knee: Secondary | ICD-10-CM | POA: Diagnosis not present

## 2014-04-21 ENCOUNTER — Encounter: Payer: Self-pay | Admitting: Cardiovascular Disease

## 2014-04-21 ENCOUNTER — Ambulatory Visit (INDEPENDENT_AMBULATORY_CARE_PROVIDER_SITE_OTHER): Payer: Medicare Other | Admitting: Cardiovascular Disease

## 2014-04-21 VITALS — BP 130/68 | HR 78 | Ht 61.0 in | Wt 244.0 lb

## 2014-04-21 DIAGNOSIS — R0989 Other specified symptoms and signs involving the circulatory and respiratory systems: Secondary | ICD-10-CM | POA: Diagnosis not present

## 2014-04-21 DIAGNOSIS — I359 Nonrheumatic aortic valve disorder, unspecified: Secondary | ICD-10-CM | POA: Diagnosis not present

## 2014-04-21 DIAGNOSIS — E785 Hyperlipidemia, unspecified: Secondary | ICD-10-CM | POA: Diagnosis not present

## 2014-04-21 DIAGNOSIS — I350 Nonrheumatic aortic (valve) stenosis: Secondary | ICD-10-CM

## 2014-04-21 NOTE — Progress Notes (Signed)
Patient ID: Debra Knight, female   DOB: 1946/09/02, 68 y.o.   MRN: [id number] 67 yo from [place]. Relocated here with husband. 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 Needs to find a medical doctor and orthopedic doctor. No chest pain. Activity limited by leg pain rather than dyspnea chest pain or palpitations Takes ASA for stroke prevention.  Echo 03/29/14  Moderate AS  Study Conclusions  - Left ventricle: The cavity size was normal. There was moderate concentric hypertrophy. Systolic function was normal. The estimated ejection fraction was in the range of 55% to 60%. Wall motion was normal; there were no regional wall motion abnormalities. There was an increased relative contribution of atrial contraction to ventricular filling. Features are consistent with a pseudonormal left ventricular filling pattern, with concomitant abnormal relaxation and increased filling pressure (grade 2 diastolic dysfunction). - Aortic valve: Moderate thickening and calcification, consistent with sclerosis. There was moderate stenosis. Mild regurgitation. - Mitral valve: Severely calcified annulus. Mildly thickened leaflets . - Left atrium: The atrium was mildly dilated.  Carotid 41-96% LICA stenosis.  F/U duplex in one year 1/16   No complaints no TIA, chest pain dyspnea or syncope  Getting injections in knees with DR Alusio   ROS: Denies fever, malais, weight loss, blurry vision, decreased visual acuity, cough, sputum, SOB, hemoptysis, pleuritic pain, palpitaitons, heartburn, abdominal pain, melena, lower extremity edema, claudication, or rash.  All other systems reviewed and negative  General: Affect appropriate Overweight white female HEENT: normal Neck supple with no adenopathy JVP normal no bruits no thyromegaly Lungs clear with no wheezing and good diaphragmatic motion Heart:  S1/S2 AS radiating to carotids  , no rub, gallop or click PMI  normal Abdomen: benighn, BS positve, no tenderness, no AAA no bruit.  No HSM or HJR Distal pulses intact with no bruits No edema Neuro non-focal Skin warm and dry No muscular weakness   Current Outpatient Prescriptions  Medication Sig Dispense Refill  . ALPRAZolam (XANAX) 0.25 MG tablet Take 1 tablet (0.25 mg total) by mouth at bedtime as needed for anxiety.  30 tablet  1  . aspirin 81 MG tablet 2 at bedtime      . atorvastatin (LIPITOR) 40 MG tablet Take 1 tablet (40 mg total) by mouth daily.  90 tablet  3  . BIOTIN PO Take 1 tablet by mouth daily.      . Glucosamine-Chondroit-Vit C-Mn (GLUCOSAMINE 1500 COMPLEX PO) Take 1 tablet by mouth 2 (two) times daily.      . Multiple Vitamin (MULTIVITAMIN) tablet Take 1 tablet by mouth daily.      . naproxen sodium (ANAPROX) 220 MG tablet Take 220 mg by mouth 2 (two) times daily.       No current facility-administered medications for this visit.    Allergies  Cortisone; Demerol; and Penicillins  Electrocardiogram:  SR normal rate 77   Assessment and Plan

## 2014-04-21 NOTE — Assessment & Plan Note (Signed)
40-59% RICA stenosis.  F/U duplex in 1 year.  

## 2014-04-21 NOTE — Assessment & Plan Note (Signed)
F/U echo in 6 months to get 2nd time point Warning signs discussed No SBE needed

## 2014-04-21 NOTE — Assessment & Plan Note (Signed)
Cholesterol is at goal.  Continue current dose of statin and diet Rx.  No myalgias or side effects.  F/U  LFT's in 6 months. Lab Results  Component Value Date   [place] 95 03/11/2014

## 2014-04-21 NOTE — Assessment & Plan Note (Signed)
Well controlled.  Continue current medications and low sodium Dash type diet.    

## 2014-04-21 NOTE — Patient Instructions (Signed)
Your physician recommends that you continue on your current medications as directed. Please refer to the Current Medication list given to you today.  Your physician has requested that you have an echocardiogram. Echocardiography is a painless test that uses sound waves to create images of your heart. It provides your doctor with information about the size and shape of your heart and how well your heart's chambers and valves are working. This procedure takes approximately one hour. There are no restrictions for this procedure.  Your physician wants you to follow-up in: 6 months with Dr. Johnsie Cancel with Echocardiogram on the same day. You will receive a reminder letter in the mail two months in advance. If you don't receive a letter, please call our office to schedule the follow-up appointment.

## 2014-04-22 DIAGNOSIS — M1710 Unilateral primary osteoarthritis, unspecified knee: Secondary | ICD-10-CM | POA: Diagnosis not present

## 2014-04-28 DIAGNOSIS — M1710 Unilateral primary osteoarthritis, unspecified knee: Secondary | ICD-10-CM | POA: Diagnosis not present

## 2014-06-17 DIAGNOSIS — M170 Bilateral primary osteoarthritis of knee: Secondary | ICD-10-CM | POA: Diagnosis not present

## 2014-07-17 DIAGNOSIS — R110 Nausea: Secondary | ICD-10-CM | POA: Diagnosis not present

## 2014-07-17 DIAGNOSIS — R260 Ataxic gait: Secondary | ICD-10-CM | POA: Diagnosis not present

## 2014-07-17 DIAGNOSIS — R42 Dizziness and giddiness: Secondary | ICD-10-CM | POA: Diagnosis not present

## 2014-07-17 DIAGNOSIS — Z87891 Personal history of nicotine dependence: Secondary | ICD-10-CM | POA: Diagnosis not present

## 2014-07-20 ENCOUNTER — Telehealth: Payer: Self-pay | Admitting: Cardiovascular Disease

## 2014-07-20 DIAGNOSIS — R42 Dizziness and giddiness: Secondary | ICD-10-CM

## 2014-07-20 NOTE — Telephone Encounter (Signed)
SPOKE WITH PT  WOULD LIKE   TO  SEE  ENT   ASAP   HAD REFERRAL FROM ER   ENT NOT  AVAILABLE UNTIL  April  NEEDS APPT  SOONER  WILL FORWARD TO DR  Debra Knight Cancel FOR  RECOMMENDATIONS./CY

## 2014-07-20 NOTE — Telephone Encounter (Signed)
All the ENT;s are in the same group.  I like [facility] Skeans or [employer]

## 2014-07-20 NOTE — Telephone Encounter (Signed)
Pt would like a referral for an ENT, she has been having vertigo for weeks now and went to ER and was told to see an ENT, wants to get in asap, if can get a call back today with the name

## 2014-07-21 NOTE — Telephone Encounter (Signed)
Follow Up       Pt calling stating she is calling back to find out about her referral. Please call back and advise.

## 2014-07-21 NOTE — Telephone Encounter (Signed)
Pt advised, thanked me.

## 2014-07-23 ENCOUNTER — Encounter: Payer: Self-pay | Admitting: Internal Medicine

## 2014-07-23 ENCOUNTER — Ambulatory Visit (INDEPENDENT_AMBULATORY_CARE_PROVIDER_SITE_OTHER): Payer: Medicare Other | Admitting: Internal Medicine

## 2014-07-23 VITALS — BP 120/70 | HR 82 | Temp 98.3°F | Wt 240.0 lb

## 2014-07-23 DIAGNOSIS — H8111 Benign paroxysmal vertigo, right ear: Secondary | ICD-10-CM | POA: Diagnosis not present

## 2014-07-23 DIAGNOSIS — H2513 Age-related nuclear cataract, bilateral: Secondary | ICD-10-CM | POA: Diagnosis not present

## 2014-07-23 DIAGNOSIS — H43813 Vitreous degeneration, bilateral: Secondary | ICD-10-CM | POA: Diagnosis not present

## 2014-07-23 DIAGNOSIS — H524 Presbyopia: Secondary | ICD-10-CM | POA: Diagnosis not present

## 2014-07-23 DIAGNOSIS — H5213 Myopia, bilateral: Secondary | ICD-10-CM | POA: Diagnosis not present

## 2014-07-23 DIAGNOSIS — H52223 Regular astigmatism, bilateral: Secondary | ICD-10-CM | POA: Diagnosis not present

## 2014-07-23 MED ORDER — HYDROCHLOROTHIAZIDE 12.5 MG PO CAPS: 12.5000 mg | ORAL_CAPSULE | Freq: Every day | ORAL | Status: DC

## 2014-07-23 NOTE — Progress Notes (Signed)
Pre visit review using our clinic review tool, if applicable. No additional management support is needed unless otherwise documented below in the visit note. 

## 2014-07-23 NOTE — Patient Instructions (Signed)
Plain Mucinex (NOT D) for thick secretions ;force NON dairy fluids .    Flonase OR Nasacort AQ 1 spray in each nostril twice a day as needed. Use the "crossover" technique into opposite nostril spraying toward opposite ear @ 45 degree angle, not straight up into nostril.  Plain Allegra (NOT D )  160 daily , Loratidine 10 mg , OR Zyrtec 10 mg @ bedtime  as needed for itchy eyes & sneezing.  Dr. Wilburn Cornelia will determine whether physical therapy referral is necessary.  Avoid excess salt or foods which are vinegary due to sodium content

## 2014-07-23 NOTE — Progress Notes (Signed)
Subjective:    Patient ID: Debra Knight, female    DOB: 11/10/1945, 68 y.o.   MRN: [id number]  HPI   She was sitting in her wheelchair 07/13/14 and turned quickly to the right. She felt as if she were spinning but not the room. Subsequently she was unable to turn her head without experiencing nausea. She was seen in the [facility] ER 11/14. CT scan was done. She was diagnosed with benign positional vertigo and meclizine was prescribed. This is helping. She is concerned as her father had Meniere's disease  She's had chronic ringing in ears which has increased  She has ethmoid sinus area pressure chronically.  She has no other symptoms of upper respiratory tract infection  She had no cardiac or neurologic prodrome prior to the event.  Review of Systems  Denied were any change in heart rhythm or rate prior to the event. There was no associated chest pain or shortness of breath .  Also specifically denied prior to the episode were headache, limb weakness, tingling, or numbness. No seizure activity noted.  Frontal headache, facial pain , nasal purulence, dental pain, sore throat , otic pain or otic discharge denied. No fever , chills or sweats.     Objective:   Physical Exam   Positive or pertinent findings include: Pupils are dilated; she seen her ophthalmologist this morning . She walks with a rocking , broad-based gait. Romberg was limited as she had to maintain a broad base. Finger-nose testing was normal Left TM membrane was scarred With extraocular motion she had nystagmus bilaterally; much more pronounced on the right   Gen.:Adequately nourished in appearance. Alert, appropriate and cooperative throughout exam.  As per CDC Guidelines ,Epic documents severe obesity as being present . Head: Normocephalic without obvious abnormalities  Eyes: No corneal or conjunctival inflammation noted.  Ears: External  ear exam reveals no significant lesions or deformities. Canals clear  . Nose: External nasal exam reveals no deformity or inflammation. Nasal mucosa are pink and moist. No lesions or exudates noted.   Mouth: Oral mucosa and oropharynx reveal no lesions or exudates. Teeth in good repair. Neck: No deformities, masses, or tenderness noted. Range of motion normal.Thyroid slightly full. Lungs: Normal respiratory effort; chest expands symmetrically. Lungs are clear to auscultation without rales, wheezes, or increased work of breathing. Heart: Normal rate and rhythm. Normal S1 and S2. No gallop, click, or rub. No murmur. Abdomen: Bowel sounds normal; abdomen soft and nontender. No masses, organomegaly or hernias noted. Musculoskeletal/extremities: No deformity or scoliosis noted of  the thoracic or lumbar spine.  No clubbing, cyanosis, edema, or significant extremity  deformity noted. Range of motion normal .Tone & strength normal. Hand joints normal Fingernail health good. Able to lie down & sit up w/o help. Negative SLR bilaterally Vascular: Carotid, radial artery, dorsalis pedis and  posterior tibial pulses are full and equal. No bruits present. Neurologic: Alert and oriented x3. Deep tendon reflexes symmetrical and normal.  Skin: Intact without suspicious lesions or rashes. Lymph: No cervical, axillary lymphadenopathy present. Psych: Mood and affect are normal. Normally interactive  Assessment & Plan:  #1 benign positional vertigo  Plan: Pathophysiology was explained to her  She is to see Dr Wilburn Cornelia, ENT Monday. See orders and recommendations

## 2014-07-26 ENCOUNTER — Telehealth: Payer: Self-pay

## 2014-07-26 DIAGNOSIS — H9313 Tinnitus, bilateral: Secondary | ICD-10-CM | POA: Diagnosis not present

## 2014-07-26 DIAGNOSIS — H9193 Unspecified hearing loss, bilateral: Secondary | ICD-10-CM | POA: Diagnosis not present

## 2014-07-26 DIAGNOSIS — R42 Dizziness and giddiness: Secondary | ICD-10-CM | POA: Diagnosis not present

## 2014-07-26 DIAGNOSIS — H6122 Impacted cerumen, left ear: Secondary | ICD-10-CM | POA: Diagnosis not present

## 2014-07-26 NOTE — Telephone Encounter (Signed)
Office visit note has been faxed

## 2014-07-26 NOTE — Telephone Encounter (Signed)
-----   Message from Hendricks Limes, MD sent at 07/26/2014  8:26 AM EST ----- Please FAX office visit  to Dr  Wilburn Cornelia, ENT. Thanks

## 2014-08-05 ENCOUNTER — Telehealth: Payer: Self-pay | Admitting: Cardiovascular Disease

## 2014-08-05 NOTE — Telephone Encounter (Signed)
Received request from Nurse fax box, documents faxed for surgical clearance. To: [place] Fax number: [phone] Attention:Wendy Caton 12.3.15/[place]

## 2014-08-11 ENCOUNTER — Telehealth: Payer: Self-pay | Admitting: Internal Medicine

## 2014-08-11 NOTE — Telephone Encounter (Signed)
Pt gets chronic bronchitis. She has started coughing yesterday. The cough wants to be productive but she can feel that it is wet and wants to move.

## 2014-08-11 NOTE — Telephone Encounter (Signed)
Pt requesting a call from assistant.

## 2014-08-11 NOTE — Telephone Encounter (Signed)
[phone] °

## 2014-08-12 MED ORDER — ALBUTEROL SULFATE HFA 108 (90 BASE) MCG/ACT IN AERS: 2.0000 | INHALATION_SPRAY | Freq: Four times a day (QID) | RESPIRATORY_TRACT | Status: DC | PRN

## 2014-08-12 NOTE — Telephone Encounter (Signed)
Gave pt md instructions. Pt agreed.

## 2014-08-12 NOTE — Telephone Encounter (Signed)
I recommended Mucinex DM twice daily  Will also send Albulterol MDI to use as needed for wheeze or chest tightness to help "open" chest - erx done If continued progression of symptoms, OV recommended - here or UC thanks

## 2014-10-19 ENCOUNTER — Other Ambulatory Visit (HOSPITAL_COMMUNITY): Payer: Medicare Other

## 2014-10-19 NOTE — Progress Notes (Signed)
Patient ID: Debra Knight, female   DOB: 1946-07-18, 69 y.o.   MRN: [id number]   68 y.o.  from [place]. Relocated here with husband. 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 Needs to find a medical doctor and orthopedic doctor. No chest pain. Activity limited by leg pain rather than dyspnea chest pain or palpitations Takes ASA for stroke prevention.  Echo 10/20/14  Moderate to severe AS  Previous gradients 34/54 mmHg  1/15    Study Conclusions  - Left ventricle: The cavity size was normal. There was moderate focal basal and mild concentric hypertrophy. Systolic function was normal. The estimated ejection fraction was in the range of 60% to 65%. Wall motion was normal; there were no regional wall motion abnormalities. Features are consistent with a pseudonormal left ventricular filling pattern, with concomitant abnormal relaxation and increased filling pressure (grade 2 diastolic dysfunction). - Aortic valve: Moderately to severely calcified annulus. Trileaflet. Moderate diffuse thickening and calcification. Valve mobility was restricted. There was moderate to severe stenosis. There was trivial regurgitation. Mean gradient (S): 38 mm Hg. - Mitral valve: Severely calcified annulus. Mildly thickened leaflets . There was trivial regurgitation. - Pulmonic valve: Poorly visualized.  Carotid  6/59/93 57-01% LICA stenosis.  F/U duplex overdue  She is to have knee surgery in April and needs clearance Long discussion with her and husband about progression of AS and risk of surgery with her obesity.  Need to r/o CAD given AS.  Also discussed need to do carotids to r/o worse stenosis and risk of CVA  She is extremely axnious.  She will likely need AVR in next 1-3 years.  Increased risk of CHF but not prohibitive of ortho surgery.  Discussed challenges of rehab after surgery and risk of DVT  Indicated we could clear her if myovue non  ischemic and carotids not >80%   ROS: Denies fever, malais, weight loss, blurry vision, decreased visual acuity, cough, sputum, SOB, hemoptysis, pleuritic pain, palpitaitons, heartburn, abdominal pain, melena, lower extremity edema, claudication, or rash.  All other systems reviewed and negative  General: Affect appropriate Overweight white female HEENT: normal Neck supple with no adenopathy JVP normal no bruits no thyromegaly Lungs clear with no wheezing and good diaphragmatic motion Heart:  S1/S2 prserved  AS radiating to carotids  , no rub, gallop or click PMI normal Abdomen: benighn, BS positve, no tenderness, no AAA no bruit.  No HSM or HJR Distal pulses intact with no bruits No edema Neuro non-focal Skin warm and dry No muscular weakness   Current Outpatient Prescriptions  Medication Sig Dispense Refill  . albuterol (PROVENTIL HFA;VENTOLIN HFA) 108 (90 BASE) MCG/ACT inhaler Inhale 2 puffs into the lungs every 6 (six) hours as needed for wheezing or shortness of breath. 1 Inhaler 0  . aspirin 81 MG tablet 2 at bedtime    . atorvastatin (LIPITOR) 40 MG tablet Take 1 tablet (40 mg total) by mouth daily. 90 tablet 3  . BIOTIN PO Take 1 tablet by mouth daily.    . Glucosamine-Chondroit-Vit C-Mn (GLUCOSAMINE 1500 COMPLEX PO) Take 1 tablet by mouth 2 (two) times daily.    . hydrochlorothiazide (MICROZIDE) 12.5 MG capsule Take 1 capsule (12.5 mg total) by mouth daily. 30 capsule 0  . meclizine (ANTIVERT) 25 MG tablet Take 25 mg by mouth as needed for dizziness.    . Multiple Vitamin (MULTIVITAMIN) tablet Take 1 tablet by mouth daily.    . naproxen sodium (ANAPROX) 220 MG  tablet Take 220 mg by mouth 2 (two) times daily.     No current facility-administered medications for this visit.    Allergies  Penicillins; Cortisone; and Demerol  Electrocardiogram:  SR normal rate 77  09/11/13  10/20/14  SR rate 79  Normal    Assessment and Plan

## 2014-10-20 ENCOUNTER — Ambulatory Visit (INDEPENDENT_AMBULATORY_CARE_PROVIDER_SITE_OTHER): Payer: Medicare Other | Admitting: Cardiovascular Disease

## 2014-10-20 ENCOUNTER — Ambulatory Visit (HOSPITAL_COMMUNITY): Payer: Medicare Other | Attending: Cardiovascular Disease | Admitting: Radiology

## 2014-10-20 ENCOUNTER — Encounter: Payer: Self-pay | Admitting: Cardiovascular Disease

## 2014-10-20 VITALS — BP 140/78 | HR 79 | Ht 61.0 in | Wt 234.4 lb

## 2014-10-20 DIAGNOSIS — Z01818 Encounter for other preprocedural examination: Secondary | ICD-10-CM | POA: Diagnosis not present

## 2014-10-20 DIAGNOSIS — I359 Nonrheumatic aortic valve disorder, unspecified: Secondary | ICD-10-CM | POA: Insufficient documentation

## 2014-10-20 DIAGNOSIS — I350 Nonrheumatic aortic (valve) stenosis: Secondary | ICD-10-CM | POA: Diagnosis not present

## 2014-10-20 DIAGNOSIS — R0989 Other specified symptoms and signs involving the circulatory and respiratory systems: Secondary | ICD-10-CM | POA: Diagnosis not present

## 2014-10-20 DIAGNOSIS — Z01810 Encounter for preprocedural cardiovascular examination: Secondary | ICD-10-CM

## 2014-10-20 DIAGNOSIS — G4733 Obstructive sleep apnea (adult) (pediatric): Secondary | ICD-10-CM | POA: Diagnosis not present

## 2014-10-20 DIAGNOSIS — E785 Hyperlipidemia, unspecified: Secondary | ICD-10-CM | POA: Diagnosis not present

## 2014-10-20 DIAGNOSIS — I10 Essential (primary) hypertension: Secondary | ICD-10-CM | POA: Diagnosis not present

## 2014-10-20 NOTE — Progress Notes (Signed)
Echocardiogram performed.  

## 2014-10-20 NOTE — Assessment & Plan Note (Signed)
Known moderate disease  F/u duplex before surgery ASA

## 2014-10-20 NOTE — Patient Instructions (Signed)
Your physician recommends that you schedule a follow-up appointment in:  AFTER TESTS ARE  DONE WITH DR [facility]  Your physician recommends that you continue on your current medications as directed. Please refer to the Current Medication list given to you today.  Your physician has requested that you have a lexiscan myoview. For further information please visit [email or web address]. Please follow instruction sheet, as given.   Your physician has requested that you have a carotid duplex. This test is an ultrasound of the carotid arteries in your neck. It looks at blood flow through these arteries that supply the brain with blood. Allow one hour for this exam. There are no restrictions or special instructions.

## 2014-10-20 NOTE — Assessment & Plan Note (Signed)
Well controlled.  Continue current medications and low sodium Dash type diet.    

## 2014-10-20 NOTE — Assessment & Plan Note (Signed)
She has lost about 10 lbs using Amada Kingfisher  Will see anesthesia pre op as airway may be difficult to manage

## 2014-10-20 NOTE — Assessment & Plan Note (Signed)
Some progression in gradient but mean still under 70mmHg with normal EF  F/u echo in 6 months no need for SBE

## 2014-10-20 NOTE — Assessment & Plan Note (Signed)
Some what complicated  Some progrssion of AS but not severe yet and no symptoms.  Moderate risk ortho surgery with comorbidities of AS, OSA, obesity. Higher risk of DVT.  Need to r/o CAD given degree of AS  Lexiscan myovue.  Also need to check carotids for progressio nof known moderate disese.  If those tests are acceptable will clear for surgery with preop anesthesia consult for OSA and airway

## 2014-10-20 NOTE — Assessment & Plan Note (Signed)
Cholesterol is at goal.  Continue current dose of statin and diet Rx.  No myalgias or side effects.  F/U  LFT's in 6 months. Lab Results  Component Value Date   [place] 95 03/11/2014

## 2014-11-02 ENCOUNTER — Ambulatory Visit (HOSPITAL_COMMUNITY): Payer: Medicare Other | Attending: Cardiovascular Disease | Admitting: Radiology

## 2014-11-02 ENCOUNTER — Ambulatory Visit (HOSPITAL_BASED_OUTPATIENT_CLINIC_OR_DEPARTMENT_OTHER): Payer: Medicare Other | Admitting: Cardiology

## 2014-11-02 DIAGNOSIS — I10 Essential (primary) hypertension: Secondary | ICD-10-CM | POA: Insufficient documentation

## 2014-11-02 DIAGNOSIS — I6523 Occlusion and stenosis of bilateral carotid arteries: Secondary | ICD-10-CM

## 2014-11-02 DIAGNOSIS — R0989 Other specified symptoms and signs involving the circulatory and respiratory systems: Secondary | ICD-10-CM | POA: Diagnosis not present

## 2014-11-02 DIAGNOSIS — Z01818 Encounter for other preprocedural examination: Secondary | ICD-10-CM | POA: Diagnosis not present

## 2014-11-02 DIAGNOSIS — I350 Nonrheumatic aortic (valve) stenosis: Secondary | ICD-10-CM | POA: Insufficient documentation

## 2014-11-02 DIAGNOSIS — Z01810 Encounter for preprocedural cardiovascular examination: Secondary | ICD-10-CM | POA: Diagnosis not present

## 2014-11-02 MED ORDER — REGADENOSON 0.4 MG/5ML IV SOLN
0.4000 mg | Freq: Once | INTRAVENOUS | Status: AC
  Administered 2014-11-02: 0.4 mg via INTRAVENOUS

## 2014-11-02 MED ORDER — TECHNETIUM TC 99M SESTAMIBI GENERIC - CARDIOLITE
33.0000 | Freq: Once | INTRAVENOUS | Status: AC | PRN
  Administered 2014-11-02: 33 via INTRAVENOUS

## 2014-11-02 NOTE — Progress Notes (Signed)
[place] [facility] NUCLEAR [facility] [address] [place], [place] [address] [phone]    Cardiology Nuclear Med Study  Debra Knight is a 69 y.o. female     MRN : [id number]     DOB: [birth year]  Procedure Date: 11/02/2014  Nuclear Med Background Indication for Stress Test:  Evaluation for Ischemia, and Surgical Clearance for  (R) TKR on 12-13-2014 by Gaynelle Arabian, MD History:  No known CAD Cardiac Risk Factors: Carotid Disease and Hypertension  Symptoms:  None indicated   Nuclear Pre-Procedure Caffeine/Decaff Intake:  None> 12 hrs NPO After: 9:30pm   Lungs:  clear O2 Sat: 97% on room air. IV 0.9% NS with Angio Cath:  22g  IV Site: L Antecubital x 1, tolerated well IV Started by:  Irven Baltimore, RN  Chest Size (in):  40 Cup Size: C  Height: 5\' 1"  (1.549 m)  Weight:  231 lb (104.781 kg)  BMI:  Body mass index is 43.67 kg/(m^2). Tech Comments:  Patient took her Xanax .25 mg on arrival at 11:50 am for anxiety about test. Irven Baltimore, RN.    Nuclear Med Study 1 or 2 day study: 2 day  Stress Test Type:  Carlton Adam  Reading MD: N/A  Order Authorizing Provider:  Jenkins Rouge, MD  Resting Radionuclide: Technetium 35m Sestamibi  Resting Radionuclide Dose: 33.0 mCi on 11/05/2014  Stress Radionuclide:  Technetium 68m Sestamibi  Stress Radionuclide Dose: 33.0 mCi on 11/02/2014          Stress Protocol Rest HR: 67 Stress HR: 94  Rest BP: 134/63 Stress BP: 122/54  Exercise Time (min): n/a METS: n/a   Predicted Max HR: 151 bpm % Max HR: 62.25 bpm Rate Pressure Product: 13160   Dose of Adenosine (mg):  n/a Dose of Lexiscan: 0.4 mg  Dose of Atropine (mg): n/a Dose of Dobutamine: n/a mcg/kg/min (at max HR)  Stress Test Technologist: Glade Lloyd, BS-ES  Nuclear Technologist:  Earl Many, CNMT     Rest Procedure:  Myocardial perfusion imaging was performed at rest 45 minutes following the intravenous administration of Technetium 6m Sestamibi. Rest ECG: NSR - Normal  EKG  Stress Procedure:  The patient received IV Lexiscan 0.4 mg over 15-seconds.  Technetium 19m Sestamibi injected at 30-seconds.  Quantitative spect images were obtained after a 45 minute delay.  During the infusion of Lexiscan the patient complained of chest and head feeling strange, stomach upset and throat tightness.  These symptoms began to resolve in recovery.  Stress ECG: No significant change from baseline ECG  QPS Raw Data Images:  Normal; no motion artifact; normal heart/lung ratio. Stress Images:  Normal homogeneous uptake in all areas of the myocardium. Rest Images:  Normal homogeneous uptake in all areas of the myocardium. Subtraction (SDS):  No evidence of ischemia. Transient Ischemic Dilatation (Normal <1.22):  0.88 Lung/Heart Ratio (Normal <0.45):  0.19  Quantitative Gated Spect Images QGS EDV:  55 ml QGS ESV:  13 ml  Impression Exercise Capacity:  Lexiscan with no exercise. BP Response:  Normal blood pressure response. Clinical Symptoms:  No significant chest pain. Head feels strange. Stomach upset. ECG Impression:  No significant ST segment change suggestive of ischemia. Comparison with Prior Nuclear Study: No images to compare  Overall Impression:  Normal stress nuclear study.  LV Ejection Fraction: 76%.  LV Wall Motion:  NL LV Function; NL Wall Motion  Darlin Coco MD

## 2014-11-02 NOTE — Progress Notes (Signed)
Carotid duplex performed 

## 2014-11-05 ENCOUNTER — Ambulatory Visit (HOSPITAL_COMMUNITY): Payer: Medicare Other | Attending: Internal Medicine

## 2014-11-05 DIAGNOSIS — R0989 Other specified symptoms and signs involving the circulatory and respiratory systems: Secondary | ICD-10-CM

## 2014-11-05 MED ORDER — TECHNETIUM TC 99M SESTAMIBI GENERIC - CARDIOLITE
30.0000 | Freq: Once | INTRAVENOUS | Status: AC | PRN
  Administered 2014-11-05: 30 via INTRAVENOUS

## 2014-11-09 ENCOUNTER — Telehealth: Payer: Self-pay | Admitting: *Deleted

## 2014-11-09 DIAGNOSIS — Z0000 Encounter for general adult medical examination without abnormal findings: Secondary | ICD-10-CM

## 2014-11-09 NOTE — Telephone Encounter (Signed)
Maryfrances Bunnell F >','<< Less Detail',event)" href="javascript:;">More Detail >>   Josue Hector, MD   Sent: Sat November 06, 2014 3:46 AM    To: Richmond Campbell, LPN        Message     Should not be on my schedule at 2:45 on Friday we discussed        Carotids ok and myovue normal Clear to have knee surgery in April although some risk for CHF given AS and obesity.          Progress Notes   KALAN RINN (MR# [id number])         Progress Notes Info     Author Note Status Last Update User    Darlin Coco, MD Signed Darlin Coco, MD    Last Update Date/Time: 11/05/2014 6:04 PM        Progress Notes     Expand All Collapse All   [facility] NUCLEAR MED [address] [place], [place] [address] [phone]    Cardiology Nuclear Med Study  PAIGHTON GODETTE is a 69 y.o. female MRN : [id number] DOB: Sep 30, 1945  Procedure Date: 11/02/2014  Nuclear Med Background Indication for Stress Test: Evaluation for Ischemia, and Surgical Clearance for (R) TKR on 12-13-2014 by Gaynelle Arabian, MD History: No known CAD Cardiac Risk Factors: Carotid Disease and Hypertension  Symptoms: None indicated   Nuclear Pre-Procedure Caffeine/Decaff Intake: None> 12 hrs NPO After: 9:30pm   Lungs: clear O2 Sat: 97% on room air. IV 0.9% NS with Angio Cath: 22g  IV Site: L Antecubital x 1, tolerated well IV Started by: Irven Baltimore, RN  Chest Size (in): 40 Cup Size: C  Height: 5\' 1"  (1.549 m)  Weight: 231 lb (104.781 kg)  BMI: Body mass index is 43.67 kg/(m^2). Tech Comments: Patient took her Xanax .25 mg on arrival at 11:50 am for anxiety about test. Irven Baltimore, RN.    Nuclear Med Study 1 or 2 day study: 2 day  Stress Test Type: Carlton Adam  Reading MD: N/A  Order Authorizing Provider: Jenkins Rouge, MD  Resting Radionuclide: Technetium 48m Sestamibi  Resting Radionuclide Dose: 33.0 mCi on 11/05/2014   Stress Radionuclide: Technetium 71m Sestamibi  Stress Radionuclide Dose: 33.0 mCi on 11/02/2014     Stress Protocol Rest HR: 67 Stress HR: 94  Rest BP: 134/63 Stress BP: 122/54  Exercise Time (min): n/a METS: n/a   Predicted Max HR: 151 bpm % Max HR: 62.25 bpm Rate Pressure Product: 13160   Dose of Adenosine (mg): n/a Dose of Lexiscan: 0.4 mg  Dose of Atropine (mg): n/a Dose of Dobutamine: n/a mcg/kg/min (at max HR)  Stress Test Technologist: Glade Lloyd, BS-ES  Nuclear Technologist: Earl Many, CNMT     Rest Procedure: Myocardial perfusion imaging was performed at rest 45 minutes following the intravenous administration of Technetium 15m Sestamibi. Rest ECG: NSR - Normal EKG  Stress Procedure: The patient received IV Lexiscan 0.4 mg over 15-seconds. Technetium 59m Sestamibi injected at 30-seconds. Quantitative spect images were obtained after a 45 minute delay. During the infusion of Lexiscan the patient complained of chest and head feeling strange, stomach upset and throat tightness. These symptoms began to resolve in recovery.  Stress ECG: No significant change from baseline ECG  QPS Raw Data Images: Normal; no motion artifact; normal heart/lung ratio. Stress Images: Normal homogeneous uptake in all areas of the myocardium. Rest Images: Normal homogeneous uptake in all areas of the myocardium. Subtraction (SDS): No  evidence of ischemia. Transient Ischemic Dilatation (Normal <1.22): 0.88 Lung/Heart Ratio (Normal <0.45): 0.19  Quantitative Gated Spect Images QGS EDV: 55 ml QGS ESV: 13 ml  Impression Exercise Capacity: Lexiscan with no exercise. BP Response: Normal blood pressure response. Clinical Symptoms: No significant chest pain. Head feels strange. Stomach upset. ECG Impression: No significant ST segment change suggestive of ischemia. Comparison with Prior Nuclear Study: No images to compare  Overall Impression: Normal  stress nuclear study.  LV Ejection Fraction: 76%. LV Wall Motion: NL LV Function; NL Wall Motion  Darlin Coco MD                Links     Previous Version     APPT  RESCHEDULED  PT   WAS  NOTIFIED .Adonis Housekeeper

## 2014-11-09 NOTE — Telephone Encounter (Signed)
PT  AWARE   REFERRAL ENTERED./CY

## 2014-11-09 NOTE — Telephone Encounter (Signed)
PT  WOULD LIKE  ANOTHER  PMD  RECOMMENDATION NOT PLEASED  WITH DR   LESCHBER   WILL FORWARD  TO  DR NISHAN./CY

## 2014-11-09 NOTE — Telephone Encounter (Signed)
I like Dr Larose Kells if she can get in with him

## 2014-11-12 ENCOUNTER — Ambulatory Visit: Payer: Medicare Other | Admitting: Cardiovascular Disease

## 2014-11-12 DIAGNOSIS — M1711 Unilateral primary osteoarthritis, right knee: Secondary | ICD-10-CM | POA: Diagnosis not present

## 2014-11-23 ENCOUNTER — Ambulatory Visit: Payer: Self-pay | Admitting: Orthopedic Surgery

## 2014-11-23 ENCOUNTER — Telehealth: Payer: Self-pay | Admitting: *Deleted

## 2014-11-23 NOTE — Telephone Encounter (Signed)
-----   Message from Rowe Clack, MD sent at 11/22/2014  5:12 PM EDT ----- Please contact [facility] office to see if Debra Knight will accept (or phone msg), then let pt know same Thanks!  ----- Message -----    From: Richmond Campbell, LPN    Sent: 10/11/221   2:55 PM      To: Colon Branch, MD, Rowe Clack, MD  PT  WOULD LIKE   TO  SEE ANOTHER PMD  DR Glasgow  DR  Asa Lente AND   DR PAZ   TO  REVIEW . [name]

## 2014-11-23 NOTE — Telephone Encounter (Signed)
[place] with me, thank you

## 2014-11-23 NOTE — Progress Notes (Signed)
Preoperative surgical orders have been place into the Epic hospital system for Debra Knight on 11/23/2014, 12:04 PM  by Mickel Crow for surgery on 12-13-2014.  Preop Total Knee orders including Experal, IV Tylenol, and IV Decadron as long as there are no contraindications to the above medications. Arlee Muslim, PA-C

## 2014-11-23 NOTE — Telephone Encounter (Signed)
Forwarding msg to Dr. Larose Kells for his response...Debra Knight

## 2014-11-23 NOTE — Telephone Encounter (Signed)
Notified pt with md response. Gave her [facility] # to call to get set-up with Dr. Larose Kells...Johny Chess

## 2014-12-06 ENCOUNTER — Other Ambulatory Visit: Payer: Self-pay | Admitting: Cardiovascular Disease

## 2014-12-06 ENCOUNTER — Other Ambulatory Visit (HOSPITAL_COMMUNITY): Payer: Self-pay | Admitting: *Deleted

## 2014-12-07 ENCOUNTER — Encounter (HOSPITAL_COMMUNITY)
Admission: RE | Admit: 2014-12-07 | Discharge: 2014-12-07 | Disposition: A | Discharge status: Home / Self Care | Payer: Medicare Other | Source: Ambulatory Visit | Attending: Orthopedic Surgery | Admitting: Orthopedic Surgery

## 2014-12-07 ENCOUNTER — Encounter (HOSPITAL_COMMUNITY): Payer: Self-pay

## 2014-12-07 DIAGNOSIS — Z01812 Encounter for preprocedural laboratory examination: Secondary | ICD-10-CM | POA: Diagnosis not present

## 2014-12-07 HISTORY — DX: Gastro-esophageal reflux disease without esophagitis: K21.9

## 2014-12-07 HISTORY — DX: Nontoxic goiter, unspecified: E04.9

## 2014-12-07 HISTORY — DX: Tinnitus, unspecified ear: H93.19

## 2014-12-07 LAB — CBC
HCT: 43.2 % (ref 36.0–46.0)
Hemoglobin: 13.8 g/dL (ref 12.0–15.0)
MCH: 28.6 pg (ref 26.0–34.0)
MCHC: 31.9 g/dL (ref 30.0–36.0)
MCV: 89.6 fL (ref 78.0–100.0)
Platelets: 213 10*3/uL (ref 150–400)
RBC: 4.82 MIL/uL (ref 3.87–5.11)
RDW: 14.1 % (ref 11.5–15.5)
WBC: 9 10*3/uL (ref 4.0–10.5)

## 2014-12-07 LAB — COMPREHENSIVE METABOLIC PANEL
ALBUMIN: 4.2 g/dL (ref 3.5–5.2)
ALT: 20 U/L (ref 0–35)
AST: 20 U/L (ref 0–37)
Alkaline Phosphatase: 89 U/L (ref 39–117)
Anion gap: 11 (ref 5–15)
BILIRUBIN TOTAL: 0.8 mg/dL (ref 0.3–1.2)
BUN: 15 mg/dL (ref 6–23)
CO2: 25 mmol/L (ref 19–32)
CREATININE: 0.56 mg/dL (ref 0.50–1.10)
Calcium: 8.9 mg/dL (ref 8.4–10.5)
Chloride: 105 mmol/L (ref 96–112)
GFR calc Af Amer: >90 mL/min (ref >90)
GFR calc non Af Amer: >90 mL/min (ref >90)
Glucose, Bld: 103 mg/dL — ABNORMAL HIGH (ref 70–99)
POTASSIUM: 3.9 mmol/L (ref 3.5–5.1)
Sodium: 141 mmol/L (ref 135–145)
TOTAL PROTEIN: 7.1 g/dL (ref 6.0–8.3)

## 2014-12-07 LAB — SURGICAL PCR SCREEN
MRSA, PCR: NEGATIVE
STAPHYLOCOCCUS AUREUS: POSITIVE — AB

## 2014-12-07 LAB — URINE MICROSCOPIC-ADD ON

## 2014-12-07 LAB — URINALYSIS, ROUTINE W REFLEX MICROSCOPIC
BILIRUBIN URINE: NEGATIVE
Glucose, UA: NEGATIVE mg/dL
Hgb urine dipstick: NEGATIVE
Ketones, ur: NEGATIVE mg/dL
NITRITE: NEGATIVE
PROTEIN: NEGATIVE mg/dL
SPECIFIC GRAVITY, URINE: 1.028 (ref 1.005–1.030)
Urobilinogen, UA: 1 mg/dL (ref 0.0–1.0)
pH: 5.5 (ref 5.0–8.0)

## 2014-12-07 LAB — PROTIME-INR
INR: 1.05 (ref 0.00–1.49)
Prothrombin Time: 13.8 seconds (ref 11.6–15.2)

## 2014-12-07 LAB — APTT: APTT: 32 s (ref 24–37)

## 2014-12-07 LAB — ABO/RH: ABO/RH(D): A POS

## 2014-12-07 NOTE — Progress Notes (Signed)
Surgery clearance note Dr. Johnsie Cancel 04/21/14 on chart, EKG 10/20/14 on EPIC

## 2014-12-07 NOTE — Progress Notes (Signed)
Positive staph results faxed to Dr. Wynelle Link via EPIC. Pt called and informed of results, prescription, and instructions.  All questions and concerns addressed. Prescription called into [facility].

## 2014-12-07 NOTE — Patient Instructions (Addendum)
TOI STELLY  12/07/2014   Your procedure is scheduled on: Monday 12/13/14  Report to [facility] Main  Entrance and follow signs to               [place] at 05:15 AM.  Call this number if you have problems the morning of surgery [phone]   Remember:  Do not eat food or drink liquids :After Midnight.    Take these medicines the morning of surgery with A SIP OF WATER: ranitidine                               You may not have any metal on your body including hair pins and              piercings  Do not wear jewelry, make-up, lotions, powders or perfumes.             Do not wear nail polish.  Do not shave  48 hours prior to surgery.              Men may shave face and neck.  Do not bring valuables to the hospital. [place].  Contacts, dentures or bridgework may not be worn into surgery.  Leave suitcase in the car. After surgery it may be brought to your room.               Please read over the following fact sheets you were given: MRSA information _____________________________________________________________________            [facility] - Preparing for Surgery Before surgery, you can play an important role.  Because skin is not sterile, your skin needs to be as free of germs as possible.  You can reduce the number of germs on your skin by washing with CHG (chlorahexidine gluconate) soap before surgery.  CHG is an antiseptic cleaner which kills germs and bonds with the skin to continue killing germs even after washing. Please DO NOT use if you have an allergy to CHG or antibacterial soaps.  If your skin becomes reddened/irritated stop using the CHG and inform your nurse when you arrive at Short Stay. Do not shave (including legs and underarms) for at least 48 hours prior to the first CHG shower.  You may shave your face/neck. Please follow these instructions carefully:  1.  Shower with CHG Soap the  night before surgery and the  morning of Surgery.  2.  If you choose to wash your hair, wash your hair first as usual with your  normal  shampoo.  3.  After you shampoo, rinse your hair and body thoroughly to remove the  shampoo.                            4.  Use CHG as you would any other liquid soap.  You can apply chg directly  to the skin and wash                       Gently with a scrungie or clean washcloth.  5.  Apply the CHG Soap to your body ONLY FROM THE NECK DOWN.   Do not use on face/  open                           Wound or open sores. Avoid contact with eyes, ears mouth and genitals (private parts).                       Wash face,  Genitals (private parts) with your normal soap.             6.  Wash thoroughly, paying special attention to the area where your surgery  will be performed.  7.  Thoroughly rinse your body with warm water from the neck down.  8.  DO NOT shower/wash with your normal soap after using and rinsing off  the CHG Soap.                9.  Pat yourself dry with a clean towel.            10.  Wear clean pajamas.            11.  Place clean sheets on your bed the night of your first shower and do not  sleep with pets. Day of Surgery : Do not apply any lotions/deodorants the morning of surgery.  Please wear clean clothes to the hospital/surgery center.  FAILURE TO FOLLOW THESE INSTRUCTIONS MAY RESULT IN THE CANCELLATION OF YOUR SURGERY PATIENT SIGNATURE_________________________________  NURSE SIGNATURE__________________________________  ________________________________________________________________________  WHAT IS A BLOOD TRANSFUSION? Blood Transfusion Information  A transfusion is the replacement of blood or some of its parts. Blood is made up of multiple cells which provide different functions.  Red blood cells carry oxygen and are used for blood loss replacement.  White blood cells fight against infection.  Platelets control bleeding.  Plasma  helps clot blood.  Other blood products are available for specialized needs, such as hemophilia or other clotting disorders. BEFORE THE TRANSFUSION  Who gives blood for transfusions?   Healthy volunteers who are fully evaluated to make sure their blood is safe. This is blood bank blood. Transfusion therapy is the safest it has ever been in the practice of medicine. Before blood is taken from a donor, a complete history is taken to make sure that person has no history of diseases nor engages in risky social behavior (examples are intravenous drug use or sexual activity with multiple partners). The donor's travel history is screened to minimize risk of transmitting infections, such as malaria. The donated blood is tested for signs of infectious diseases, such as HIV and hepatitis. The blood is then tested to be sure it is compatible with you in order to minimize the chance of a transfusion reaction. If you or a relative donates blood, this is often done in anticipation of surgery and is not appropriate for emergency situations. It takes many days to process the donated blood. RISKS AND COMPLICATIONS Although transfusion therapy is very safe and saves many lives, the main dangers of transfusion include:  1. Getting an infectious disease. 2. Developing a transfusion reaction. This is an allergic reaction to something in the blood you were given. Every precaution is taken to prevent this. The decision to have a blood transfusion has been considered carefully by your caregiver before blood is given. Blood is not given unless the benefits outweigh the risks. AFTER THE TRANSFUSION  Right after receiving a blood transfusion, you will usually feel much better and more energetic. This is especially true if your red blood  cells have gotten low (anemic). The transfusion raises the level of the red blood cells which carry oxygen, and this usually causes an energy increase.  The nurse administering the transfusion  will monitor you carefully for complications. HOME CARE INSTRUCTIONS  No special instructions are needed after a transfusion. You may find your energy is better. Speak with your caregiver about any limitations on activity for underlying diseases you may have. SEEK MEDICAL CARE IF:   Your condition is not improving after your transfusion.  You develop redness or irritation at the intravenous (IV) site. SEEK IMMEDIATE MEDICAL CARE IF:  Any of the following symptoms occur over the next 12 hours:  Shaking chills.  You have a temperature by mouth above 102 F (38.9 C), not controlled by medicine.  Chest, back, or muscle pain.  People around you feel you are not acting correctly or are confused.  Shortness of breath or difficulty breathing.  Dizziness and fainting.  You get a rash or develop hives.  You have a decrease in urine output.  Your urine turns a dark color or changes to pink, red, or brown. Any of the following symptoms occur over the next 10 days:  You have a temperature by mouth above 102 F (38.9 C), not controlled by medicine.  Shortness of breath.  Weakness after normal activity.  The white part of the eye turns yellow (jaundice).  You have a decrease in the amount of urine or are urinating less often.  Your urine turns a dark color or changes to pink, red, or brown. Document Released: 08/17/2000 Document Revised: 11/12/2011 Document Reviewed: 04/05/2008 ExitCare Patient Information 2014 [place].  _______________________________________________________________________  Incentive Spirometer  An incentive spirometer is a tool that can help keep your lungs clear and active. This tool measures how well you are filling your lungs with each breath. Taking long deep breaths may help reverse or decrease the chance of developing breathing (pulmonary) problems (especially infection) following:  A long period of time when you are unable to move or be  active. BEFORE THE PROCEDURE   If the spirometer includes an indicator to show your best effort, your nurse or respiratory therapist will set it to a desired goal.  If possible, sit up straight or lean slightly forward. Try not to slouch.  Hold the incentive spirometer in an upright position. INSTRUCTIONS FOR USE  3. Sit on the edge of your bed if possible, or sit up as far as you can in bed or on a chair. 4. Hold the incentive spirometer in an upright position. 5. Breathe out normally. 6. Place the mouthpiece in your mouth and seal your lips tightly around it. 7. Breathe in slowly and as deeply as possible, raising the piston or the ball toward the top of the column. 8. Hold your breath for 3-5 seconds or for as long as possible. Allow the piston or ball to fall to the bottom of the column. 9. Remove the mouthpiece from your mouth and breathe out normally. 10. Rest for a few seconds and repeat Steps 1 through 7 at least 10 times every 1-2 hours when you are awake. Take your time and take a few normal breaths between deep breaths. 11. The spirometer may include an indicator to show your best effort. Use the indicator as a goal to work toward during each repetition. 12. After each set of 10 deep breaths, practice coughing to be sure your lungs are clear. If you have an incision (the cut made  at the time of surgery), support your incision when coughing by placing a pillow or rolled up towels firmly against it. Once you are able to get out of bed, walk around indoors and cough well. You may stop using the incentive spirometer when instructed by your caregiver.  RISKS AND COMPLICATIONS  Take your time so you do not get dizzy or light-headed.  If you are in pain, you may need to take or ask for pain medication before doing incentive spirometry. It is harder to take a deep breath if you are having pain. AFTER USE  Rest and breathe slowly and easily.  It can be helpful to keep track of a log of  your progress. Your caregiver can provide you with a simple table to help with this. If you are using the spirometer at home, follow these instructions: [place] IF:   You are having difficultly using the spirometer.  You have trouble using the spirometer as often as instructed.  Your pain medication is not giving enough relief while using the spirometer.  You develop fever of 100.5 F (38.1 C) or higher. SEEK IMMEDIATE MEDICAL CARE IF:   You cough up bloody sputum that had not been present before.  You develop fever of 102 F (38.9 C) or greater.  You develop worsening pain at or near the incision site. MAKE SURE YOU:   Understand these instructions.  Will watch your condition.  Will get help right away if you are not doing well or get worse. Document Released: 12/31/2006 Document Revised: 11/12/2011 Document Reviewed: 03/03/2007 [facility] Patient Information 2014 [place], [place].   ________________________________________________________________________

## 2014-12-09 NOTE — Progress Notes (Signed)
Fax received from Dr. Wynelle Link that stated cipro 500mg  #6 BID was called into pharmacy.  Fax on chart.

## 2014-12-11 ENCOUNTER — Ambulatory Visit: Payer: Self-pay | Admitting: Orthopedic Surgery

## 2014-12-11 NOTE — H&P (Signed)
Debra Knight DOB: 1945-12-14 Married / Language: [name] / Race: White Female Date of Admission:  12/13/2014 CC:  Right Knee Pain History of Present Illness  The patient is a 69 year old female who comes in for a preoperative History and Physical. The patient is scheduled for a right total knee arthroplasty to be performed by Dr. Dione Plover. Aluisio, MD at [facility] on 12-13-2014. The patient is a 69 year old female who presented for follow up of their knee. The patient is being followed for their bilateral knee pain and osteoarthritis. Symptoms reported include: pain. The patient feels that they are doing poorly. The following medication has been used for pain control: antiinflammatory medication (Aleve twice daily). Patient states that she received some relief for about 3weeks after the injections. Unfortunately the injections have not provided any long lasting benefit. She has pain at all times. The left knee is worse than the right. All of this is limiting her activity. The most predictable means of improving pain and function long term would be knee replacement. She is extremely frightened to undergo the surgery. They have been treated conservatively in the past for the above stated problem and despite conservative measures, they continue to have progressive pain and severe functional limitations and dysfunction. They have failed non-operative management including home exercise, medications, and injections. It is felt that they would benefit from undergoing total joint replacement. Risks and benefits of the procedure have been discussed with the patient and they elect to proceed with surgery. There are no active contraindications to surgery such as ongoing infection or rapidly progressive neurological disease.   Problem List/Past Medical Primary osteoarthritis of both knees (M17.0) Osteoarthritis of right knee (M17.9) Heart murmur Osteoarthritis Sleep Apnea uses  CPAP Hypercholesterolemia Vertigo Tinnitus Hypercholesterolemia Gastroesophageal Reflux Disease Menopause Aortic Stenosis  Allergies Penicillins Hives. Demerol *ANALGESICS - OPIOID* Vomiting. Advil *ANALGESICS - ANTI-INFLAMMATORY* tongue swells  Social History Marital status married No history of drug/alcohol rehab Living situation live with spouse Current work status retired Exercise Exercises never Tobacco / smoke exposure 03/11/2014: no Tobacco use Former smoker. 03/11/2014: smoke(d) 1 pack(s) per day Current drinker 03/11/2014: Currently drinks wine only occasionally per week Children 2  Medication History Xanax (0.25MG  Tablet, 1 (one) Tablet Oral PO BID, Taken starting 06/17/2014) Active. Glucosamine Chondroitin Complx (Oral) Active. Aspirin (81MG  Tablet, Oral) Active. Aleve PM (220-25MG  Tablet, Oral) Active. Biotin (Oral) Specific dose unknown - Active. Atorvastatin Calcium (40MG  Tablet, Oral) Active.  Past Surgical History Dilation and Curettage of Uterus Tonsillectomy  Review of Systems General Present- Weight Loss (Diet is working). Not Present- Chills, Fatigue, Fever, Memory Loss, Night Sweats and Weight Gain. Skin Not Present- Eczema, Hives, Itching, Lesions and Rash. HEENT Not Present- Dentures, Double Vision, Headache, Hearing Loss, Tinnitus and Visual Loss. Respiratory Not Present- Allergies, Chronic Cough, Coughing up blood, Shortness of breath at rest and Shortness of breath with exertion. Cardiovascular Not Present- Chest Pain, Difficulty Breathing Lying Down, Murmur, Palpitations, Racing/skipping heartbeats and Swelling. Gastrointestinal Not Present- Abdominal Pain, Bloody Stool, Constipation, Diarrhea, Difficulty Swallowing, Heartburn, Jaundice, Loss of appetitie, Nausea and Vomiting. Female Genitourinary Not Present- Blood in Urine, Discharge, Flank Pain, Incontinence, Painful Urination, Urgency, Urinary frequency, Urinary  Retention, Urinating at Night and Weak urinary stream. Musculoskeletal Present- Joint Pain. Not Present- Back Pain, Joint Swelling, Morning Stiffness, Muscle Pain, Muscle Weakness and Spasms. Neurological Not Present- Blackout spells, Difficulty with balance, Dizziness, Paralysis, Tremor and Weakness. Psychiatric Not Present- Insomnia.  Vitals  BP: 154/74 (Sitting, Right  Arm, Standard)  Physical Exam General Mental Status -Alert, cooperative and good historian. General Appearance-pleasant, Not in acute distress. Orientation-Oriented X3. Build & Nutrition-Well nourished and Well developed.  Head and Neck Head-normocephalic, atraumatic . Neck Global Assessment - supple, no bruit auscultated on the right, no bruit auscultated on the left.  Eye Pupil - Bilateral-Regular and Round. Motion - Bilateral-EOMI.  Chest and Lung Exam Auscultation Breath sounds - clear at anterior chest wall and clear at posterior chest wall. Adventitious sounds - No Adventitious sounds.  Cardiovascular Auscultation Rhythm - Regular rate and rhythm. Heart Sounds - S1 WNL and S2 WNL. Murmurs & Other Heart Sounds - Auscultation of the heart reveals - No Murmurs.  Abdomen Palpation/Percussion Tenderness - Abdomen is non-tender to palpation. Rigidity (guarding) - Abdomen is soft. Auscultation Auscultation of the abdomen reveals - Bowel sounds normal.  Female Genitourinary Note: Not done, not pertinent to present illness  Musculoskeletal Note: Right Lower Extremity: Right Knee: Inspection and Palpation - Tenderness - generalized, medial joint line tender to palpation and lateral joint line tender to palpation(mild). Swelling - none. Effusion - none. Strength and Tone - Quadriceps - 5/5. Hamstrings - 5/5. ROM: Flexion - AROM - 100 . Note: with significant crepitus, 110 on L. PROM - 125 . Extension - AROM - 3 . Note: 0 on L. PROM - 0 . Alignment - Knees (standing) - Bilateral -  varus(moderate R worse than L). Gait and Station - Abnormal Gait Patterns - No antalgic gait, No flexed knee gait. Assistive Devices - no assistive devices.  RADIOGRAPHS: She has severe bone on bone arthritis, medial and patellofemoral. Both knees radiographically are slightly worse on the right, but on the left it is also bone on bone, varus deformity.  Assessment & Plan Primary osteoarthritis of both knees (M17.0) Note:Surgical Plans: Right Total Knee Repalcement  Disposition: HOME  PCP: Dr. Gwendolyn Grant Cardiology: Dr. Johnsie Cancel - Patient has been seen preoperatively and felt to be stable for surgery.  IV TXA  Signed electronically by Joelene Millin, III PA-C

## 2014-12-12 MED ORDER — VANCOMYCIN HCL 10 G IV SOLR
1500.0000 mg | INTRAVENOUS | Status: AC
  Administered 2014-12-13: 1500 mg via INTRAVENOUS
  Filled 2014-12-12: qty 1500

## 2014-12-13 ENCOUNTER — Encounter (HOSPITAL_COMMUNITY): Payer: Self-pay | Admitting: *Deleted

## 2014-12-13 ENCOUNTER — Encounter (HOSPITAL_COMMUNITY)
Admission: RE | Disposition: A | Discharge status: Home Health | Payer: Self-pay | Source: Ambulatory Visit | Attending: Orthopedic Surgery

## 2014-12-13 ENCOUNTER — Inpatient Hospital Stay (HOSPITAL_COMMUNITY): Payer: Medicare Other | Admitting: Anesthesiology

## 2014-12-13 ENCOUNTER — Inpatient Hospital Stay (HOSPITAL_COMMUNITY)
Admission: RE | Admit: 2014-12-13 | Discharge: 2014-12-15 | DRG: 470 | Disposition: A | Discharge status: Home Health | Payer: Medicare Other | Source: Ambulatory Visit | Attending: Orthopedic Surgery | Admitting: Orthopedic Surgery

## 2014-12-13 DIAGNOSIS — Z888 Allergy status to other drugs, medicaments and biological substances status: Secondary | ICD-10-CM | POA: Diagnosis not present

## 2014-12-13 DIAGNOSIS — Z7982 Long term (current) use of aspirin: Secondary | ICD-10-CM

## 2014-12-13 DIAGNOSIS — Z880 Allergy status to penicillin: Secondary | ICD-10-CM | POA: Diagnosis not present

## 2014-12-13 DIAGNOSIS — M1710 Unilateral primary osteoarthritis, unspecified knee: Secondary | ICD-10-CM | POA: Diagnosis present

## 2014-12-13 DIAGNOSIS — M170 Bilateral primary osteoarthritis of knee: Secondary | ICD-10-CM | POA: Diagnosis not present

## 2014-12-13 DIAGNOSIS — E780 Pure hypercholesterolemia: Secondary | ICD-10-CM | POA: Diagnosis not present

## 2014-12-13 DIAGNOSIS — M1711 Unilateral primary osteoarthritis, right knee: Secondary | ICD-10-CM

## 2014-12-13 DIAGNOSIS — E785 Hyperlipidemia, unspecified: Secondary | ICD-10-CM | POA: Diagnosis present

## 2014-12-13 DIAGNOSIS — M179 Osteoarthritis of knee, unspecified: Secondary | ICD-10-CM | POA: Diagnosis not present

## 2014-12-13 DIAGNOSIS — Z87891 Personal history of nicotine dependence: Secondary | ICD-10-CM | POA: Diagnosis not present

## 2014-12-13 DIAGNOSIS — Z885 Allergy status to narcotic agent status: Secondary | ICD-10-CM | POA: Diagnosis not present

## 2014-12-13 DIAGNOSIS — G8918 Other acute postprocedural pain: Secondary | ICD-10-CM | POA: Diagnosis not present

## 2014-12-13 DIAGNOSIS — I350 Nonrheumatic aortic (valve) stenosis: Secondary | ICD-10-CM | POA: Diagnosis present

## 2014-12-13 DIAGNOSIS — H9319 Tinnitus, unspecified ear: Secondary | ICD-10-CM | POA: Diagnosis present

## 2014-12-13 DIAGNOSIS — K219 Gastro-esophageal reflux disease without esophagitis: Secondary | ICD-10-CM | POA: Diagnosis not present

## 2014-12-13 DIAGNOSIS — Z79899 Other long term (current) drug therapy: Secondary | ICD-10-CM | POA: Diagnosis not present

## 2014-12-13 DIAGNOSIS — R011 Cardiac murmur, unspecified: Secondary | ICD-10-CM | POA: Diagnosis present

## 2014-12-13 DIAGNOSIS — J302 Other seasonal allergic rhinitis: Secondary | ICD-10-CM | POA: Diagnosis present

## 2014-12-13 DIAGNOSIS — G4733 Obstructive sleep apnea (adult) (pediatric): Secondary | ICD-10-CM | POA: Diagnosis present

## 2014-12-13 HISTORY — PX: TOTAL KNEE ARTHROPLASTY: SHX125

## 2014-12-13 LAB — TYPE AND SCREEN
ABO/RH(D): A POS
ANTIBODY SCREEN: NEGATIVE

## 2014-12-13 SURGERY — ARTHROPLASTY, KNEE, TOTAL
Anesthesia: Regional | Site: Knee | Laterality: Right

## 2014-12-13 MED ORDER — BUPIVACAINE LIPOSOME 1.3 % IJ SUSP
INTRAMUSCULAR | Status: DC | PRN
  Administered 2014-12-13: 50 mL

## 2014-12-13 MED ORDER — DEXAMETHASONE SODIUM PHOSPHATE 10 MG/ML IJ SOLN
10.0000 mg | Freq: Once | INTRAMUSCULAR | Status: DC
Start: 2014-12-13 — End: 2014-12-13

## 2014-12-13 MED ORDER — LACTATED RINGERS IV SOLN
INTRAVENOUS | Status: DC
  Administered 2014-12-13: 10:00:00 via INTRAVENOUS

## 2014-12-13 MED ORDER — FENTANYL CITRATE 0.05 MG/ML IJ SOLN
INTRAMUSCULAR | Status: AC
  Filled 2014-12-13: qty 5

## 2014-12-13 MED ORDER — ESMOLOL HCL 10 MG/ML IV SOLN
INTRAVENOUS | Status: AC
  Filled 2014-12-13: qty 10

## 2014-12-13 MED ORDER — METOCLOPRAMIDE HCL 10 MG PO TABS: 5.0000 mg | ORAL_TABLET | Freq: Three times a day (TID) | ORAL | Status: DC | PRN

## 2014-12-13 MED ORDER — FAMOTIDINE 20 MG PO TABS
20.0000 mg | ORAL_TABLET | Freq: Two times a day (BID) | ORAL | Status: DC
  Administered 2014-12-13 – 2014-12-15 (×4): 20 mg via ORAL
  Filled 2014-12-13 (×5): qty 1

## 2014-12-13 MED ORDER — ROPIVACAINE HCL 5 MG/ML IJ SOLN
INTRAMUSCULAR | Status: DC | PRN
  Administered 2014-12-13: 30 mL via PERINEURAL

## 2014-12-13 MED ORDER — PROPOFOL 10 MG/ML IV BOLUS
INTRAVENOUS | Status: AC
  Filled 2014-12-13: qty 20

## 2014-12-13 MED ORDER — METOCLOPRAMIDE HCL 5 MG/ML IJ SOLN
5.0000 mg | Freq: Three times a day (TID) | INTRAMUSCULAR | Status: DC | PRN
Start: 2014-12-13 — End: 2014-12-15

## 2014-12-13 MED ORDER — ROPIVACAINE HCL 5 MG/ML IJ SOLN
INTRAMUSCULAR | Status: AC
  Filled 2014-12-13: qty 30

## 2014-12-13 MED ORDER — SODIUM CHLORIDE 0.9 % IJ SOLN
INTRAMUSCULAR | Status: AC
  Filled 2014-12-13: qty 50

## 2014-12-13 MED ORDER — LIDOCAINE HCL (CARDIAC) 20 MG/ML IV SOLN
INTRAVENOUS | Status: DC | PRN
  Administered 2014-12-13: 50 mg via INTRAVENOUS

## 2014-12-13 MED ORDER — ONDANSETRON HCL 4 MG/2ML IJ SOLN
INTRAMUSCULAR | Status: AC
  Filled 2014-12-13: qty 2

## 2014-12-13 MED ORDER — HYDROMORPHONE HCL 1 MG/ML IJ SOLN
INTRAMUSCULAR | Status: AC
  Filled 2014-12-13: qty 1

## 2014-12-13 MED ORDER — ACETAMINOPHEN 500 MG PO TABS
1000.0000 mg | ORAL_TABLET | Freq: Four times a day (QID) | ORAL | Status: AC
  Administered 2014-12-13 – 2014-12-14 (×3): 1000 mg via ORAL
  Filled 2014-12-13 (×3): qty 2

## 2014-12-13 MED ORDER — PROPOFOL 10 MG/ML IV BOLUS
INTRAVENOUS | Status: DC | PRN
  Administered 2014-12-13: 120 mg via INTRAVENOUS
  Administered 2014-12-13: 20 mg via INTRAVENOUS

## 2014-12-13 MED ORDER — DOCUSATE SODIUM 100 MG PO CAPS
100.0000 mg | ORAL_CAPSULE | Freq: Two times a day (BID) | ORAL | Status: DC
  Administered 2014-12-13 – 2014-12-15 (×4): 100 mg via ORAL

## 2014-12-13 MED ORDER — BISACODYL 10 MG RE SUPP: 10.0000 mg | Freq: Every day | RECTAL | Status: DC | PRN

## 2014-12-13 MED ORDER — FLEET ENEMA 7-19 GM/118ML RE ENEM: 1.0000 | ENEMA | Freq: Once | RECTAL | Status: AC | PRN

## 2014-12-13 MED ORDER — DIPHENHYDRAMINE HCL 12.5 MG/5ML PO ELIX: 12.5000 mg | ORAL_SOLUTION | ORAL | Status: DC | PRN

## 2014-12-13 MED ORDER — FENTANYL CITRATE 0.05 MG/ML IJ SOLN
INTRAMUSCULAR | Status: DC | PRN
  Administered 2014-12-13: 100 ug via INTRAVENOUS
  Administered 2014-12-13: 50 ug via INTRAVENOUS
  Administered 2014-12-13 (×2): 25 ug via INTRAVENOUS
  Administered 2014-12-13 (×3): 50 ug via INTRAVENOUS

## 2014-12-13 MED ORDER — ACETAMINOPHEN 325 MG PO TABS: 650.0000 mg | ORAL_TABLET | Freq: Four times a day (QID) | ORAL | Status: DC | PRN

## 2014-12-13 MED ORDER — ONDANSETRON HCL 4 MG/2ML IJ SOLN
INTRAMUSCULAR | Status: DC | PRN
  Administered 2014-12-13: 4 mg via INTRAVENOUS

## 2014-12-13 MED ORDER — ESMOLOL HCL 10 MG/ML IV SOLN
INTRAVENOUS | Status: DC | PRN
  Administered 2014-12-13: 10 mg via INTRAVENOUS

## 2014-12-13 MED ORDER — SUCCINYLCHOLINE CHLORIDE 20 MG/ML IJ SOLN
INTRAMUSCULAR | Status: DC | PRN
  Administered 2014-12-13: 100 mg via INTRAVENOUS

## 2014-12-13 MED ORDER — METHOCARBAMOL 500 MG PO TABS
500.0000 mg | ORAL_TABLET | Freq: Four times a day (QID) | ORAL | Status: DC | PRN
  Administered 2014-12-13 – 2014-12-15 (×6): 500 mg via ORAL
  Filled 2014-12-13 (×5): qty 1

## 2014-12-13 MED ORDER — POLYETHYLENE GLYCOL 3350 17 G PO PACK: 17.0000 g | PACK | Freq: Every day | ORAL | Status: DC | PRN

## 2014-12-13 MED ORDER — BUPIVACAINE LIPOSOME 1.3 % IJ SUSP
20.0000 mL | Freq: Once | INTRAMUSCULAR | Status: DC
  Filled 2014-12-13: qty 20

## 2014-12-13 MED ORDER — BUPIVACAINE HCL 0.25 % IJ SOLN
INTRAMUSCULAR | Status: DC | PRN
  Administered 2014-12-13: 30 mL

## 2014-12-13 MED ORDER — ATORVASTATIN CALCIUM 40 MG PO TABS
40.0000 mg | ORAL_TABLET | Freq: Every day | ORAL | Status: DC
  Administered 2014-12-13 – 2014-12-14 (×2): 40 mg via ORAL
  Filled 2014-12-13 (×3): qty 1

## 2014-12-13 MED ORDER — VANCOMYCIN HCL IN DEXTROSE 1-5 GM/200ML-% IV SOLN
1000.0000 mg | Freq: Two times a day (BID) | INTRAVENOUS | Status: AC
Start: 2014-12-13 — End: 2014-12-13
  Administered 2014-12-13: 1000 mg via INTRAVENOUS
  Filled 2014-12-13: qty 200

## 2014-12-13 MED ORDER — RIVAROXABAN 10 MG PO TABS
10.0000 mg | ORAL_TABLET | Freq: Every day | ORAL | Status: DC
  Administered 2014-12-14 – 2014-12-15 (×2): 10 mg via ORAL
  Filled 2014-12-13 (×3): qty 1

## 2014-12-13 MED ORDER — MIDAZOLAM HCL 2 MG/2ML IJ SOLN
INTRAMUSCULAR | Status: AC
  Filled 2014-12-13: qty 2

## 2014-12-13 MED ORDER — ONDANSETRON HCL 4 MG/2ML IJ SOLN
4.0000 mg | Freq: Four times a day (QID) | INTRAMUSCULAR | Status: DC | PRN
  Administered 2014-12-15: 4 mg via INTRAVENOUS
  Filled 2014-12-13: qty 2

## 2014-12-13 MED ORDER — PHENOL 1.4 % MT LIQD
1.0000 | OROMUCOSAL | Status: DC | PRN
  Filled 2014-12-13: qty 177

## 2014-12-13 MED ORDER — PHENYLEPHRINE HCL 10 MG/ML IJ SOLN
INTRAMUSCULAR | Status: AC
  Filled 2014-12-13: qty 1

## 2014-12-13 MED ORDER — PHENYLEPHRINE 40 MCG/ML (10ML) SYRINGE FOR IV PUSH (FOR BLOOD PRESSURE SUPPORT)
PREFILLED_SYRINGE | INTRAVENOUS | Status: AC
  Filled 2014-12-13: qty 10

## 2014-12-13 MED ORDER — PHENYLEPHRINE HCL 10 MG/ML IJ SOLN
10.0000 mg | INTRAVENOUS | Status: DC | PRN
  Administered 2014-12-13: 40 ug/min via INTRAVENOUS

## 2014-12-13 MED ORDER — MENTHOL 3 MG MT LOZG: 1.0000 | LOZENGE | OROMUCOSAL | Status: DC | PRN

## 2014-12-13 MED ORDER — PHENYLEPHRINE HCL 10 MG/ML IJ SOLN
INTRAMUSCULAR | Status: DC | PRN
  Administered 2014-12-13: 80 ug via INTRAVENOUS

## 2014-12-13 MED ORDER — MORPHINE SULFATE 2 MG/ML IJ SOLN
1.0000 mg | INTRAMUSCULAR | Status: DC | PRN
  Administered 2014-12-13 (×2): 2 mg via INTRAVENOUS
  Administered 2014-12-14 (×2): 1 mg via INTRAVENOUS
  Filled 2014-12-13 (×4): qty 1

## 2014-12-13 MED ORDER — ACETAMINOPHEN 10 MG/ML IV SOLN
1000.0000 mg | Freq: Once | INTRAVENOUS | Status: DC
  Filled 2014-12-13: qty 100

## 2014-12-13 MED ORDER — OXYCODONE HCL 5 MG PO TABS
5.0000 mg | ORAL_TABLET | ORAL | Status: DC | PRN
  Administered 2014-12-13 – 2014-12-14 (×3): 5 mg via ORAL
  Administered 2014-12-14: 10 mg via ORAL
  Administered 2014-12-14: 5 mg via ORAL
  Administered 2014-12-14 – 2014-12-15 (×6): 10 mg via ORAL
  Filled 2014-12-13 (×3): qty 2
  Filled 2014-12-13 (×2): qty 1
  Filled 2014-12-13 (×3): qty 2
  Filled 2014-12-13: qty 1
  Filled 2014-12-13: qty 2
  Filled 2014-12-13: qty 1

## 2014-12-13 MED ORDER — DEXTROSE 5 % IV SOLN
500.0000 mg | Freq: Four times a day (QID) | INTRAVENOUS | Status: DC | PRN
  Administered 2014-12-13: 500 mg via INTRAVENOUS
  Filled 2014-12-13 (×2): qty 5

## 2014-12-13 MED ORDER — MIDAZOLAM HCL 5 MG/5ML IJ SOLN
INTRAMUSCULAR | Status: DC | PRN
  Administered 2014-12-13: 2 mg via INTRAVENOUS

## 2014-12-13 MED ORDER — LACTATED RINGERS IV SOLN
INTRAVENOUS | Status: DC | PRN
  Administered 2014-12-13 (×2): via INTRAVENOUS

## 2014-12-13 MED ORDER — SODIUM CHLORIDE 0.9 % IJ SOLN
INTRAMUSCULAR | Status: DC | PRN
  Administered 2014-12-13: 30 mL

## 2014-12-13 MED ORDER — BUPIVACAINE-EPINEPHRINE (PF) 0.25% -1:200000 IJ SOLN
INTRAMUSCULAR | Status: AC
  Filled 2014-12-13: qty 30

## 2014-12-13 MED ORDER — ONDANSETRON HCL 4 MG PO TABS
4.0000 mg | ORAL_TABLET | Freq: Four times a day (QID) | ORAL | Status: DC | PRN
  Administered 2014-12-15: 4 mg via ORAL
  Filled 2014-12-13: qty 1

## 2014-12-13 MED ORDER — HYDROMORPHONE HCL 1 MG/ML IJ SOLN
0.2500 mg | INTRAMUSCULAR | Status: DC | PRN
  Administered 2014-12-13 (×2): 0.25 mg via INTRAVENOUS
  Administered 2014-12-13: 0.5 mg via INTRAVENOUS

## 2014-12-13 MED ORDER — ACETAMINOPHEN 650 MG RE SUPP: 650.0000 mg | Freq: Four times a day (QID) | RECTAL | Status: DC | PRN

## 2014-12-13 MED ORDER — PROMETHAZINE HCL 25 MG/ML IJ SOLN: 6.2500 mg | INTRAMUSCULAR | Status: DC | PRN

## 2014-12-13 MED ORDER — POTASSIUM CHLORIDE IN NACL 20-0.9 MEQ/L-% IV SOLN
INTRAVENOUS | Status: DC
  Administered 2014-12-13 – 2014-12-14 (×2): via INTRAVENOUS
  Filled 2014-12-13 (×2): qty 1000

## 2014-12-13 MED ORDER — SODIUM CHLORIDE 0.9 % IV SOLN: INTRAVENOUS | Status: DC

## 2014-12-13 MED ORDER — TRANEXAMIC ACID 100 MG/ML IV SOLN
1000.0000 mg | INTRAVENOUS | Status: AC
  Administered 2014-12-13: 1000 mg via INTRAVENOUS
  Filled 2014-12-13: qty 10

## 2014-12-13 MED ORDER — TRAMADOL HCL 50 MG PO TABS
50.0000 mg | ORAL_TABLET | Freq: Four times a day (QID) | ORAL | Status: DC | PRN
  Administered 2014-12-14: 50 mg via ORAL
  Administered 2014-12-15 (×2): 100 mg via ORAL
  Filled 2014-12-13: qty 2
  Filled 2014-12-13: qty 1
  Filled 2014-12-13: qty 2

## 2014-12-13 SURGICAL SUPPLY — 62 items
BAG DECANTER FOR FLEXI CONT (MISCELLANEOUS) IMPLANT
BAG ZIPLOCK 12X15 (MISCELLANEOUS) ×2 IMPLANT
BANDAGE ELASTIC 6 VELCRO ST LF (GAUZE/BANDAGES/DRESSINGS) ×2 IMPLANT
BANDAGE ESMARK 6X9 LF (GAUZE/BANDAGES/DRESSINGS) ×1 IMPLANT
BLADE SAG 18X100X1.27 (BLADE) ×2 IMPLANT
BLADE SAW SGTL 11.0X1.19X90.0M (BLADE) ×2 IMPLANT
BNDG ESMARK 6X9 LF (GAUZE/BANDAGES/DRESSINGS) ×2
BOWL SMART MIX CTS (DISPOSABLE) ×2 IMPLANT
CAP KNEE TOTAL 3 SIGMA ×2 IMPLANT
CEMENT HV SMART SET (Cement) ×4 IMPLANT
CUFF TOURN SGL QUICK 34 (TOURNIQUET CUFF) ×1
CUFF TRNQT CYL 34X4X40X1 (TOURNIQUET CUFF) ×1 IMPLANT
DECANTER SPIKE VIAL GLASS SM (MISCELLANEOUS) ×2 IMPLANT
DRAPE EXTREMITY T 121X128X90 (DRAPE) ×2 IMPLANT
DRAPE POUCH INSTRU U-SHP 10X18 (DRAPES) ×2 IMPLANT
DRAPE U-SHAPE 47X51 STRL (DRAPES) ×2 IMPLANT
DRSG ADAPTIC 3X8 NADH LF (GAUZE/BANDAGES/DRESSINGS) ×2 IMPLANT
DRSG PAD ABDOMINAL 8X10 ST (GAUZE/BANDAGES/DRESSINGS) ×2 IMPLANT
DURAPREP 26ML APPLICATOR (WOUND CARE) ×2 IMPLANT
ELECT REM PT RETURN 9FT ADLT (ELECTROSURGICAL) ×2
ELECTRODE REM PT RTRN 9FT ADLT (ELECTROSURGICAL) ×1 IMPLANT
EVACUATOR 1/8 PVC DRAIN (DRAIN) ×2 IMPLANT
FACESHIELD WRAPAROUND (MASK) ×10 IMPLANT
GAUZE SPONGE 4X4 12PLY STRL (GAUZE/BANDAGES/DRESSINGS) ×2 IMPLANT
GLOVE BIO SURGEON STRL SZ7.5 (GLOVE) ×2 IMPLANT
GLOVE BIO SURGEON STRL SZ8 (GLOVE) ×2 IMPLANT
GLOVE BIOGEL PI IND STRL 6.5 (GLOVE) IMPLANT
GLOVE BIOGEL PI IND STRL 7.0 (GLOVE) ×1 IMPLANT
GLOVE BIOGEL PI IND STRL 8 (GLOVE) ×2 IMPLANT
GLOVE BIOGEL PI INDICATOR 6.5 (GLOVE)
GLOVE BIOGEL PI INDICATOR 7.0 (GLOVE) ×1
GLOVE BIOGEL PI INDICATOR 8 (GLOVE) ×2
GLOVE SURG SS PI 6.5 STRL IVOR (GLOVE) IMPLANT
GOWN STRL REUS W/TWL LRG LVL3 (GOWN DISPOSABLE) ×2 IMPLANT
GOWN STRL REUS W/TWL XL LVL3 (GOWN DISPOSABLE) IMPLANT
HANDPIECE INTERPULSE COAX TIP (DISPOSABLE) ×1
IMMOBILIZER KNEE 20 (SOFTGOODS) ×2
IMMOBILIZER KNEE 20 THIGH 36 (SOFTGOODS) ×1 IMPLANT
KIT BASIN OR (CUSTOM PROCEDURE TRAY) ×2 IMPLANT
MANIFOLD NEPTUNE II (INSTRUMENTS) ×2 IMPLANT
NDL SAFETY ECLIPSE 18X1.5 (NEEDLE) ×2 IMPLANT
NEEDLE HYPO 18GX1.5 SHARP (NEEDLE) ×2
NS IRRIG 1000ML POUR BTL (IV SOLUTION) ×2 IMPLANT
PACK TOTAL JOINT (CUSTOM PROCEDURE TRAY) ×2 IMPLANT
PADDING CAST COTTON 6X4 STRL (CAST SUPPLIES) ×2 IMPLANT
PEN SKIN MARKING BROAD (MISCELLANEOUS) ×2 IMPLANT
POSITIONER SURGICAL ARM (MISCELLANEOUS) ×2 IMPLANT
SET HNDPC FAN SPRY TIP SCT (DISPOSABLE) ×1 IMPLANT
STRIP CLOSURE SKIN 1/2X4 (GAUZE/BANDAGES/DRESSINGS) ×2 IMPLANT
SUCTION FRAZIER 12FR DISP (SUCTIONS) ×2 IMPLANT
SUT MNCRL AB 4-0 PS2 18 (SUTURE) ×2 IMPLANT
SUT VIC AB 2-0 CT1 27 (SUTURE) ×5
SUT VIC AB 2-0 CT1 TAPERPNT 27 (SUTURE) ×5 IMPLANT
SUT VLOC 180 0 24IN GS25 (SUTURE) ×2 IMPLANT
SYR 20CC LL (SYRINGE) ×2 IMPLANT
SYR 50ML LL SCALE MARK (SYRINGE) ×2 IMPLANT
TOWEL OR 17X26 10 PK STRL BLUE (TOWEL DISPOSABLE) ×2 IMPLANT
TOWEL OR NON WOVEN STRL DISP B (DISPOSABLE) IMPLANT
TRAY FOLEY CATH 14FRSI W/METER (CATHETERS) ×2 IMPLANT
WATER STERILE IRR 1500ML POUR (IV SOLUTION) ×2 IMPLANT
WRAP KNEE MAXI GEL POST OP (GAUZE/BANDAGES/DRESSINGS) ×2 IMPLANT
YANKAUER SUCT BULB TIP 10FT TU (MISCELLANEOUS) ×2 IMPLANT

## 2014-12-13 NOTE — Anesthesia Preprocedure Evaluation (Addendum)
Anesthesia Evaluation  Patient identified by MRN, date of birth, ID band Patient awake    Reviewed: Allergy & Precautions, NPO status , Patient's Chart, lab work & pertinent test results  History of Anesthesia Complications (+) Family history of anesthesia reaction  Airway Mallampati: II  TM Distance: >3 FB Neck ROM: Full    Dental no notable dental hx.    Pulmonary sleep apnea , former smoker,  breath sounds clear to auscultation  Pulmonary exam normal       Cardiovascular hypertension, Pt. on medications Rhythm:Regular Rate:Normal  Echo 10/20/2014 - Left ventricle: The cavity size was normal. There was moderatefocal basal and mild concentric hypertrophy. Systolic functionwas normal. The estimated ejection fraction was in the range of60% to 65%. Wall motion was normal; there were no regional wallmotion abnormalities. Features are consistent with a pseudonormalleft ventricular filling pattern, with concomitant abnormalrelaxation and increased filling pressure (grade 2 diastolicdysfunction). - Aortic valve: Moderately to severely calcified annulus. Trileaflet. Moderate diffuse thickening and calcification. Valvemobility was restricted. There was moderate to severe stenosis.There was trivial regurgitation. Mean gradient (S): 38 mm Hg. - Mitral valve: Severely calcified annulus. Mildly thickened leaflets . There was trivial regurgitation. - Pulmonic valve: Poorly visualized.  NM stress 11/08/2014 Normal stress nuclear study. LV Ejection Fraction: 76%. LV Wall Motion: NL LV Function; NL Wall Motion   Neuro/Psych negative neurological ROS  negative psych ROS   GI/Hepatic Neg liver ROS, GERD-  Medicated,  Endo/Other  Morbid obesity  Renal/GU negative Renal ROS     Musculoskeletal  (+) Arthritis -,   Abdominal (+) + obese,   Peds  Hematology negative hematology ROS (+)   Anesthesia Other Findings    Reproductive/Obstetrics negative OB ROS                        Anesthesia Physical Anesthesia Plan  ASA: IV  Anesthesia Plan: General and Regional   Post-op Pain Management:    Induction: Intravenous  Airway Management Planned: Oral ETT  Additional Equipment:   Intra-op Plan:   Post-operative Plan: Extubation in OR  Informed Consent: I have reviewed the patients History and Physical, chart, labs and discussed the procedure including the risks, benefits and alternatives for the proposed anesthesia with the patient or authorized representative who has indicated his/her understanding and acceptance.   Dental advisory given  Plan Discussed with: CRNA  Anesthesia Plan Comments: (+/- arterial line)      Anesthesia Quick Evaluation

## 2014-12-13 NOTE — Progress Notes (Signed)
AssistedDr. Germeroth with right, adductor canal block. Side rails up, monitors on throughout procedure. See vital signs in flow sheet. Tolerated Procedure well.  

## 2014-12-13 NOTE — Anesthesia Procedure Notes (Addendum)
Anesthesia Regional Block:  Adductor canal block  Pre-Anesthetic Checklist: ,, timeout performed, Correct Patient, Correct Site, Correct Laterality, Correct Procedure, Correct Position, site marked, Risks and benefits discussed, Surgical consent,  Pre-op evaluation,  Post-op pain management  Laterality: Right  Prep: chloraprep       Needles:  Injection technique: Single-shot  Needle Type: Stimiplex     Needle Length: 9cm 9 cm Needle Gauge: 21 and 21 G    Additional Needles:  Procedures: ultrasound guided (picture in chart) Adductor canal block Narrative:  Injection made incrementally with aspirations every 5 mL.  Performed by: Personally  Anesthesiologist: Nolon Nations  Additional Notes: BP cuff, EKG monitors applied. Sedation begun. Artery and nerve location verified with U/S and anesthetic injected incrementally, slowly, and after negative aspirations under direct u/s guidance. Good fascial/perineural spread. Tolerated well.   Procedure Name: Intubation Date/Time: 12/13/2014 8:22 AM Performed by: Anne Fu Pre-anesthesia Checklist: Patient identified, Emergency Drugs available, Suction available, Patient being monitored and Timeout performed Patient Re-evaluated:Patient Re-evaluated prior to inductionOxygen Delivery Method: Circle system utilized Preoxygenation: Pre-oxygenation with 100% oxygen Intubation Type: IV induction Ventilation: Mask ventilation without difficulty Laryngoscope Size: Mac and 4 Grade View: Grade I Tube type: Oral Tube size: 7.5 mm Number of attempts: 1 Airway Equipment and Method: Stylet Placement Confirmation: ETT inserted through vocal cords under direct vision,  positive ETCO2,  CO2 detector and breath sounds checked- equal and bilateral Secured at: 21 cm Tube secured with: Tape Dental Injury: Teeth and Oropharynx as per pre-operative assessment

## 2014-12-13 NOTE — H&P (View-Only) (Signed)
Debra Knight DOB: 1946/04/15 Married / Language: [name] / Race: White Female Date of Admission:  12/13/2014 CC:  Right Knee Pain History of Present Illness  The patient is a 69 year old female who comes in for a preoperative History and Physical. The patient is scheduled for a right total knee arthroplasty to be performed by Dr. Dione Plover. Aluisio, MD at [facility] on 12-13-2014. The patient is a 69 year old female who presented for follow up of their knee. The patient is being followed for their bilateral knee pain and osteoarthritis. Symptoms reported include: pain. The patient feels that they are doing poorly. The following medication has been used for pain control: antiinflammatory medication (Aleve twice daily). Patient states that she received some relief for about 3weeks after the injections. Unfortunately the injections have not provided any long lasting benefit. She has pain at all times. The left knee is worse than the right. All of this is limiting her activity. The most predictable means of improving pain and function long term would be knee replacement. She is extremely frightened to undergo the surgery. They have been treated conservatively in the past for the above stated problem and despite conservative measures, they continue to have progressive pain and severe functional limitations and dysfunction. They have failed non-operative management including home exercise, medications, and injections. It is felt that they would benefit from undergoing total joint replacement. Risks and benefits of the procedure have been discussed with the patient and they elect to proceed with surgery. There are no active contraindications to surgery such as ongoing infection or rapidly progressive neurological disease.   Problem List/Past Medical Primary osteoarthritis of both knees (M17.0) Osteoarthritis of right knee (M17.9) Heart murmur Osteoarthritis Sleep Apnea uses  CPAP Hypercholesterolemia Vertigo Tinnitus Hypercholesterolemia Gastroesophageal Reflux Disease Menopause Aortic Stenosis  Allergies Penicillins Hives. Demerol *ANALGESICS - OPIOID* Vomiting. Advil *ANALGESICS - ANTI-INFLAMMATORY* tongue swells  Social History Marital status married No history of drug/alcohol rehab Living situation live with spouse Current work status retired Exercise Exercises never Tobacco / smoke exposure 03/11/2014: no Tobacco use Former smoker. 03/11/2014: smoke(d) 1 pack(s) per day Current drinker 03/11/2014: Currently drinks wine only occasionally per week Children 2  Medication History Xanax (0.25MG  Tablet, 1 (one) Tablet Oral PO BID, Taken starting 06/17/2014) Active. Glucosamine Chondroitin Complx (Oral) Active. Aspirin (81MG  Tablet, Oral) Active. Aleve PM (220-25MG  Tablet, Oral) Active. Biotin (Oral) Specific dose unknown - Active. Atorvastatin Calcium (40MG  Tablet, Oral) Active.  Past Surgical History Dilation and Curettage of Uterus Tonsillectomy  Review of Systems General Present- Weight Loss (Diet is working). Not Present- Chills, Fatigue, Fever, Memory Loss, Night Sweats and Weight Gain. Skin Not Present- Eczema, Hives, Itching, Lesions and Rash. HEENT Not Present- Dentures, Double Vision, Headache, Hearing Loss, Tinnitus and Visual Loss. Respiratory Not Present- Allergies, Chronic Cough, Coughing up blood, Shortness of breath at rest and Shortness of breath with exertion. Cardiovascular Not Present- Chest Pain, Difficulty Breathing Lying Down, Murmur, Palpitations, Racing/skipping heartbeats and Swelling. Gastrointestinal Not Present- Abdominal Pain, Bloody Stool, Constipation, Diarrhea, Difficulty Swallowing, Heartburn, Jaundice, Loss of appetitie, Nausea and Vomiting. Female Genitourinary Not Present- Blood in Urine, Discharge, Flank Pain, Incontinence, Painful Urination, Urgency, Urinary frequency, Urinary  Retention, Urinating at Night and Weak urinary stream. Musculoskeletal Present- Joint Pain. Not Present- Back Pain, Joint Swelling, Morning Stiffness, Muscle Pain, Muscle Weakness and Spasms. Neurological Not Present- Blackout spells, Difficulty with balance, Dizziness, Paralysis, Tremor and Weakness. Psychiatric Not Present- Insomnia.  Vitals  BP: 154/74 (Sitting, Right  Arm, Standard)  Physical Exam General Mental Status -Alert, cooperative and good historian. General Appearance-pleasant, Not in acute distress. Orientation-Oriented X3. Build & Nutrition-Well nourished and Well developed.  Head and Neck Head-normocephalic, atraumatic . Neck Global Assessment - supple, no bruit auscultated on the right, no bruit auscultated on the left.  Eye Pupil - Bilateral-Regular and Round. Motion - Bilateral-EOMI.  Chest and Lung Exam Auscultation Breath sounds - clear at anterior chest wall and clear at posterior chest wall. Adventitious sounds - No Adventitious sounds.  Cardiovascular Auscultation Rhythm - Regular rate and rhythm. Heart Sounds - S1 WNL and S2 WNL. Murmurs & Other Heart Sounds - Auscultation of the heart reveals - No Murmurs.  Abdomen Palpation/Percussion Tenderness - Abdomen is non-tender to palpation. Rigidity (guarding) - Abdomen is soft. Auscultation Auscultation of the abdomen reveals - Bowel sounds normal.  Female Genitourinary Note: Not done, not pertinent to present illness  Musculoskeletal Note: Right Lower Extremity: Right Knee: Inspection and Palpation - Tenderness - generalized, medial joint line tender to palpation and lateral joint line tender to palpation(mild). Swelling - none. Effusion - none. Strength and Tone - Quadriceps - 5/5. Hamstrings - 5/5. ROM: Flexion - AROM - 100 . Note: with significant crepitus, 110 on L. PROM - 125 . Extension - AROM - 3 . Note: 0 on L. PROM - 0 . Alignment - Knees (standing) - Bilateral -  varus(moderate R worse than L). Gait and Station - Abnormal Gait Patterns - No antalgic gait, No flexed knee gait. Assistive Devices - no assistive devices.  RADIOGRAPHS: She has severe bone on bone arthritis, medial and patellofemoral. Both knees radiographically are slightly worse on the right, but on the left it is also bone on bone, varus deformity.  Assessment & Plan Primary osteoarthritis of both knees (M17.0) Note:Surgical Plans: Right Total Knee Repalcement  Disposition: HOME  PCP: Dr. Gwendolyn Grant Cardiology: Dr. Johnsie Cancel - Patient has been seen preoperatively and felt to be stable for surgery.  IV TXA  Signed electronically by Joelene Millin, III PA-C

## 2014-12-13 NOTE — Progress Notes (Signed)
Patient states she took Cipro x3 days for UTI. Could not remember dosage

## 2014-12-13 NOTE — Op Note (Signed)
Pre-operative diagnosis- Osteoarthritis  Right knee(s)  Post-operative diagnosis- Osteoarthritis Right knee(s)  Procedure-  Right  Total Knee Arthroplasty  Surgeon- Debra Knight. Debra Lamorte, MD  Assistant- Debra Muslim, PA-C   Anesthesia-  General and adductor canal block  EBL-* No blood loss amount entered *   Drains Hemovac  Tourniquet time-  Total Tourniquet Time Documented: Thigh (Right) - 32 minutes Total: Thigh (Right) - 32 minutes     Complications- None  Condition-PACU - hemodynamically stable.   Brief Clinical Note  Debra Knight is a 69 y.o. year old female with end stage OA of her right knee with progressively worsening pain and dysfunction. She has constant pain, with activity and at rest and significant functional deficits with difficulties even with ADLs. She has had extensive non-op management including analgesics, injections of cortisone, and home exercise program, but remains in significant pain with significant dysfunction.Radiographs show bone on bone arthritis medial and patellofemoral. She presents now for right Total Knee Arthroplasty.    Procedure in detail---   The patient is brought into the operating room and positioned supine on the operating table. After successful administration of adductor canal block and  General,   a tourniquet is placed high on the  Right thigh(s) and the lower extremity is prepped and draped in the usual sterile fashion. Time out is performed by the operating team and then the  Right lower extremity is wrapped in Esmarch, knee flexed and the tourniquet inflated to 300 mmHg.       A midline incision is made with a ten blade through the subcutaneous tissue to the level of the extensor mechanism. A fresh blade is used to make a medial parapatellar arthrotomy. Soft tissue over the proximal medial tibia is subperiosteally elevated to the joint line with a knife and into the semimembranosus bursa with a Cobb elevator. Soft tissue over the proximal  lateral tibia is elevated with attention being paid to avoiding the patellar tendon on the tibial tubercle. The patella is everted, knee flexed 90 degrees and the ACL and PCL are removed. Findings are tricompartmental bone on bone with massive global osteophytes.        The drill is used to create a starting hole in the distal femur and the canal is thoroughly irrigated with sterile saline to remove the fatty contents. The 5 degree Right  valgus alignment guide is placed into the femoral canal and the distal femoral cutting block is pinned to remove 10 mm off the distal femur. Resection is made with an oscillating saw.      The tibia is subluxed forward and the menisci are removed. The extramedullary alignment guide is placed referencing proximally at the medial aspect of the tibial tubercle and distally along the second metatarsal axis and tibial crest. The block is pinned to remove 8mm off the more deficient medial  side. Resection is made with an oscillating saw. Size 3is the most appropriate size for the tibia and the proximal tibia is prepared with the modular drill and keel punch for that size.      The femoral sizing guide is placed and size 4 is most appropriate. Rotation is marked off the epicondylar axis and confirmed by creating a rectangular flexion gap at 90 degrees. The size 4 cutting block is pinned in this rotation and the anterior, posterior and chamfer cuts are made with the oscillating saw. The intercondylar block is then placed and that cut is made.      Trial size  3 tibial component, trial size 4 narrow posterior stabilized femur and a 17.5  mm posterior stabilized rotating platform insert trial is placed. Full extension is achieved with excellent varus/valgus and anterior/posterior balance throughout full range of motion. The patella is everted and thickness measured to be 24  mm. Free hand resection is taken to 14 mm, a 38 template is placed, lug holes are drilled, trial patella is placed,  and it tracks normally. Osteophytes are removed off the posterior femur with the trial in place. All trials are removed and the cut bone surfaces prepared with pulsatile lavage. Cement is mixed and once ready for implantation, the size 3 tibial implant, size  4 narrow posterior stabilized femoral component, and the size 38 patella are cemented in place and the patella is held with the clamp. The trial insert is placed and the knee held in full extension. The Exparel (20 ml mixed with 30 ml saline) and .25% Bupivicaine, are injected into the extensor mechanism, posterior capsule, medial and lateral gutters and subcutaneous tissues.  All extruded cement is removed and once the cement is hard the permanent 17.5 mm posterior stabilized rotating platform insert is placed into the tibial tray.      The wound is copiously irrigated with saline solution and the extensor mechanism closed over a hemovac drain with #1 V-loc suture. The tourniquet is released for a total tourniquet time of 32  minutes. Flexion against gravity is 130 degrees and the patella tracks normally. Subcutaneous tissue is closed with 2.0 vicryl and subcuticular with running 4.0 Monocryl. The incision is cleaned and dried and steri-strips and a bulky sterile dressing are applied. The limb is placed into a knee immobilizer and the patient is awakened and transported to recovery in stable condition.      Please note that a surgical assistant was a medical necessity for this procedure in order to perform it in a safe and expeditious manner. Surgical assistant was necessary to retract the ligaments and vital neurovascular structures to prevent injury to them and also necessary for proper positioning of the limb to allow for anatomic placement of the prosthesis.   Debra Knight Debra Ousley, MD    12/13/2014, 9:21 AM

## 2014-12-13 NOTE — Transfer of Care (Signed)
Immediate Anesthesia Transfer of Care Note  Patient: Debra Knight  Procedure(s) Performed: Procedure(s) (LRB): RIGHT TOTAL KNEE ARTHROPLASTY (Right)  Patient Location: PACU  Anesthesia Type: General  Level of Consciousness: sedated, patient cooperative and responds to stimulation  Airway & Oxygen Therapy: Patient Spontanous Breathing and Patient connected to face mask oxgen  Post-op Assessment: Report given to PACU RN and Post -op Vital signs reviewed and stable  Post vital signs: Reviewed and stable  Complications: No apparent anesthesia complications

## 2014-12-13 NOTE — Evaluation (Addendum)
Physical Therapy Evaluation Patient Details Name: Debra Knight MRN: [id number] DOB: 05-28-1946 Today's Date: 12/13/2014   History of Present Illness  RTKA, adductor canal block  Clinical Impression  Patient mobilizing well,  C/o nausea after standing and taking  A few steps. Patient will benefit from PT to address problems listed in note below. Plans Dc home.  Follow Up Recommendations Home health PT;Supervision/Assistance - 24 hour    Equipment Recommendations  None recommended by PT    Recommendations for Other Services       Precautions / Restrictions Precautions Precautions: Knee;Fall Required Braces or Orthoses: Knee Immobilizer - Right Knee Immobilizer - Right: Discontinue once straight leg raise with < 10 degree lag  ADDUCTOR CANAL BLOCK    Mobility  Bed Mobility Overal bed mobility: Needs Assistance Bed Mobility: Supine to Sit     Supine to sit: Min assist     General bed mobility comments: support R leg  Transfers Overall transfer level: Needs assistance Equipment used: Rolling walker (2 wheeled) Transfers: Sit to/from Stand Sit to Stand: Min assist;From elevated surface         General transfer comment: cues for hand and R leg position  Ambulation/Gait Ambulation/Gait assistance: Min assist;+2 safety/equipment Ambulation Distance (Feet): 10 Feet Assistive device: Rolling walker (2 wheeled) Gait Pattern/deviations: Step-to pattern;Antalgic;Decreased step length - right;Decreased stance time - right     General Gait Details: chair followed behind, patient c/o nausea , assisted  to sit down. improved  BP 130/44.  Stairs            Wheelchair Mobility    Modified Rankin (Stroke Patients Only)       Balance                                             Pertinent Vitals/Pain Pain Assessment: 0-10 Pain Score: 5  Pain Descriptors / Indicators: Aching;Discomfort Pain Intervention(s): Limited activity within patient's  tolerance;Monitored during session;Premedicated before session;Ice applied    Home Living Family/patient expects to be discharged to:: Private residence Living Arrangements: Spouse/significant other Available Help at Discharge: Family Type of Home: House Home Access: Stairs to enter Entrance Stairs-Rails: Psychiatric nurse of Steps: 3 Home Layout: One level Home Equipment: Environmental consultant - 2 wheels      Prior Function Level of Independence: Independent               Hand Dominance        Extremity/Trunk Assessment               Lower Extremity Assessment: RLE deficits/detail RLE Deficits / Details: assist to lift leg from bed, support to floor       Communication   Communication: No difficulties  Cognition Arousal/Alertness: Awake/alert Behavior During Therapy: WFL for tasks assessed/performed Overall Cognitive Status: Within Functional Limits for tasks assessed                      General Comments      Exercises        Assessment/Plan    PT Assessment Patient needs continued PT services  PT Diagnosis Difficulty walking;Acute pain   PT Problem List Decreased strength;Decreased range of motion;Decreased activity tolerance;Decreased mobility;Decreased knowledge of precautions;Decreased safety awareness;Decreased knowledge of use of DME;Pain  PT Treatment Interventions DME instruction;Gait training;Stair training;Functional mobility training;Therapeutic activities;Therapeutic exercise;Patient/family education   PT Goals (  Current goals can be found in the Care Plan section) Acute Rehab PT Goals Patient Stated Goal: to go home, walk PT Goal Formulation: With patient/family Time For Goal Achievement: 12/20/14 Potential to Achieve Goals: Good    Frequency 7X/week   Barriers to discharge        Co-evaluation               End of Session Equipment Utilized During Treatment: Gait belt Activity Tolerance:  (by nausea) Patient  left: in chair;with call bell/phone within reach;with family/visitor present Nurse Communication: Mobility status         Time: 8242-3536 PT Time Calculation (min) (ACUTE ONLY): 18 min   Charges:   PT Evaluation $Initial PT Evaluation Tier I: 1 Procedure     PT G CodesClaretha Cooper 12/13/2014, 6:24 PM Tresa Endo PT [phone]

## 2014-12-13 NOTE — Progress Notes (Signed)
Utilization review completed.  

## 2014-12-13 NOTE — Anesthesia Postprocedure Evaluation (Signed)
Anesthesia Post Note  Patient: Debra Knight  Procedure(s) Performed: Procedure(s) (LRB): RIGHT TOTAL KNEE ARTHROPLASTY (Right)  Anesthesia type: General + adductor canal block  Patient location: PACU  Post pain: Pain level controlled  Post assessment: Post-op Vital signs reviewed  Last Vitals: BP 130/65 mmHg  Pulse 82  Temp(Src) 36.8 C (Oral)  Resp 16  Ht 5\' 1"  (1.549 m)  Wt 222 lb (100.699 kg)  BMI 41.97 kg/m2  SpO2 95%  Post vital signs: Reviewed  Level of consciousness: sedated  Complications: No apparent anesthesia complications

## 2014-12-13 NOTE — Interval H&P Note (Signed)
History and Physical Interval Note:  12/13/2014 7:07 AM  Debra Knight  has presented today for surgery, with the diagnosis of RIGHT KNEE OA  The various methods of treatment have been discussed with the patient and family. After consideration of risks, benefits and other options for treatment, the patient has consented to  Procedure(s): RIGHT TOTAL KNEE ARTHROPLASTY (Right) as a surgical intervention .  The patient's history has been reviewed, patient examined, no change in status, stable for surgery.  I have reviewed the patient's chart and labs.  Questions were answered to the patient's satisfaction.     [name]

## 2014-12-14 LAB — BASIC METABOLIC PANEL
ANION GAP: 6 (ref 5–15)
BUN: 11 mg/dL (ref 6–23)
CALCIUM: 8.1 mg/dL — AB (ref 8.4–10.5)
CO2: 25 mmol/L (ref 19–32)
Chloride: 105 mmol/L (ref 96–112)
Creatinine, Ser: 0.64 mg/dL (ref 0.50–1.10)
GFR calc non Af Amer: 89 mL/min — ABNORMAL LOW (ref >90)
Glucose, Bld: 163 mg/dL — ABNORMAL HIGH (ref 70–99)
Potassium: 3.7 mmol/L (ref 3.5–5.1)
Sodium: 136 mmol/L (ref 135–145)

## 2014-12-14 LAB — CBC
HEMATOCRIT: 31.9 % — AB (ref 36.0–46.0)
Hemoglobin: 10.1 g/dL — ABNORMAL LOW (ref 12.0–15.0)
MCH: 29.1 pg (ref 26.0–34.0)
MCHC: 31.7 g/dL (ref 30.0–36.0)
MCV: 91.9 fL (ref 78.0–100.0)
Platelets: 195 10*3/uL (ref 150–400)
RBC: 3.47 MIL/uL — ABNORMAL LOW (ref 3.87–5.11)
RDW: 14.5 % (ref 11.5–15.5)
WBC: 8.5 10*3/uL (ref 4.0–10.5)

## 2014-12-14 MED ORDER — OMEPRAZOLE 20 MG PO CPDR
20.0000 mg | DELAYED_RELEASE_CAPSULE | Freq: Every day | ORAL | Status: DC
  Administered 2014-12-14 – 2014-12-15 (×2): 20 mg via ORAL
  Filled 2014-12-14 (×2): qty 1

## 2014-12-14 MED ORDER — NON FORMULARY: 20.0000 mg | Status: DC

## 2014-12-14 NOTE — Care Management Note (Signed)
    Page 1 of 2   12/14/2014     1:30:28 PM CARE MANAGEMENT NOTE 12/14/2014  Patient:  Debra Knight, Debra Knight   Account Number:  [id number]  Date Initiated:  12/14/2014  Documentation initiated by:  [facility]  Subjective/Objective Assessment:   adm: RIGHT TOTAL KNEE ARTHROPLASTY (Right)     Action/Plan:   discharge planning   Anticipated DC Date:  12/15/2014   Anticipated DC Plan:  [place]  CM consult  PCP issues      [facility] Choice  HOME HEALTH   Choice offered to / List presented to:  C-1 Patient   DME arranged  Albertha Ghee      DME agency  [place] arranged  [place]   Status of service:  Completed, signed off Medicare Important Message given?   (If response is "NO", the following Medicare IM given date fields will be blank) Date Medicare IM given:   Medicare IM given by:   Date Additional Medicare IM given:   Additional Medicare IM given by:    Discharge Disposition:  [place]  Per UR Regulation:    If discussed at Long Length of Stay Meetings, dates discussed:    Comments:  12/14/14 09:00 Cm met with pt in room to offer choice of home health agency.  Pt chooses [facility] to render HHPT.  CM called [facility] DME rep, Lecretia to  please deliver a youth rolling walker to room.  Referral emailed to [employer], Tim.  [facility] number placed on AVS for pt to secure a new PCP as she does not want to go with [facility]. of [place] (too long to get first appt).  No other CM needs were commun icated.  Mariane Masters, BSN, [place].

## 2014-12-14 NOTE — Progress Notes (Signed)
   Subjective: 1 Day Post-Op Procedure(s) (LRB): RIGHT TOTAL KNEE ARTHROPLASTY (Right) Patient reports pain as mild to moderate last night.  Had trouble with cramping. Patient seen in rounds with Dr. Wynelle Link.  She did get up and walk 10 feet yesterday in the room. Patient is well, but has had some minor complaints of pain in the knee, requiring pain medications We will resume therapy today.  Plan is to go Home after hospital stay.  Objective: Vital signs in last 24 hours: Temp:  [98 F (36.7 C)-99.9 F (37.7 C)] 99.6 F (37.6 C) (04/12 0607) Pulse Rate:  [73-85] 78 (04/12 0607) Resp:  [9-18] 18 (04/12 0724) BP: (106-163)/(47-82) 119/61 mmHg (04/12 0607) SpO2:  [91 %-100 %] 98 % (04/12 0724) Weight:  [100.699 kg (222 lb)] 100.699 kg (222 lb) (04/11 1129)  Intake/Output from previous day:  Intake/Output Summary (Last 24 hours) at 12/14/14 0809 Last data filed at 12/14/14 0700  Gross per 24 hour  Intake 3441.25 ml  Output   1605 ml  Net 1836.25 ml    Intake/Output this shift: UOP 650 since around [place]  Labs:  Recent Labs  12/14/14 0431  HGB 10.1*    Recent Labs  12/14/14 0431  WBC 8.5  RBC 3.47*  HCT 31.9*  PLT 195    Recent Labs  12/14/14 0431  NA 136  K 3.7  CL 105  CO2 25  BUN 11  CREATININE 0.64  GLUCOSE 163*  CALCIUM 8.1*   No results for input(s): LABPT, INR in the last 72 hours.  EXAM General - Patient is Alert, Appropriate and Oriented Extremity - Neurovascular intact Sensation intact distally Dorsiflexion/Plantar flexion intact Dressing - dressing C/D/I Motor Function - intact, moving foot and toes well on exam.  Hemovac pulled without difficulty.  Past Medical History  Diagnosis Date  . Hyperlipidemia   . OSA (obstructive sleep apnea)   . Aortic stenosis     moderate on Echo 09/2013, Dr. Johnsie Cancel monitoring  . Carotid bruit     LICA 54-27% 0/6237 doppler "right"  . Osteoarthritis of both knees   . Vertigo 07/2014    8 weeks  .  GERD (gastroesophageal reflux disease)   . Tinnitus   . Enlarged thyroid   . Seasonal allergies   . Family history of adverse reaction to anesthesia     "father- age 44 had confusion from anesthesia"  . Epiglottitis     hx of 40 years ago    Assessment/Plan: 1 Day Post-Op Procedure(s) (LRB): RIGHT TOTAL KNEE ARTHROPLASTY (Right) Principal Problem:   OA (osteoarthritis) of knee  Estimated body mass index is 41.97 kg/(m^2) as calculated from the following:   Height as of this encounter: 5\' 1"  (1.549 m).   Weight as of this encounter: 100.699 kg (222 lb). Advance diet Up with therapy Plan for discharge tomorrow Discharge home with home health  DVT Prophylaxis - Xarelto Weight-Bearing as tolerated to right leg D/C O2 and Pulse OX and try on Room Air  Arlee Muslim, PA-C Orthopaedic Surgery 12/14/2014, 8:09 AM

## 2014-12-14 NOTE — Progress Notes (Signed)
Physical Therapy Treatment Patient Details Name: Debra Knight MRN: [id number] DOB: 1945/10/23 Today's Date: 12/14/2014    History of Present Illness RTKA    PT Comments    POD # 1 pm session.  Assisted pt with amb in hallway a second time with increased distance.  Assisted back to bed for CPM.    Follow Up Recommendations  Home health PT;Supervision/Assistance - 24 hour     Equipment Recommendations  Rolling walker with 5" wheels (youth)    Recommendations for Other Services       Precautions / Restrictions Precautions Precautions: Knee;Fall Precaution Comments: applied Ki as pt was unable to perform active SLR Required Braces or Orthoses: Knee Immobilizer - Right Knee Immobilizer - Right: Discontinue once straight leg raise with < 10 degree lag Restrictions Weight Bearing Restrictions: No Other Position/Activity Restrictions: WBAT    Mobility  Bed Mobility Overal bed mobility: Needs Assistance Bed Mobility: Sit to Supine     Supine to sit: Min assist Sit to supine: Min assist   General bed mobility comments: Min Assist to support R LE back into bed  Transfers Overall transfer level: Needs assistance Equipment used: Rolling walker (2 wheeled) Transfers: Sit to/from Stand Sit to Stand: Min assist;From elevated surface         General transfer comment: 25% cues for hand and R leg position plus increased time  Ambulation/Gait Ambulation/Gait assistance: Min assist Ambulation Distance (Feet): 26 Feet Assistive device: Rolling walker (2 wheeled) Gait Pattern/deviations: Step-to pattern;Decreased stance time - right;Trunk flexed Gait velocity: decreased   General Gait Details: great difficulty weight shifting and required increased time to complete distance.    Stairs            Wheelchair Mobility    Modified Rankin (Stroke Patients Only)       Balance                                    Cognition Arousal/Alertness:  Awake/alert Behavior During Therapy: WFL for tasks assessed/performed Overall Cognitive Status: Within Functional Limits for tasks assessed                      Exercises      General Comments        Pertinent Vitals/Pain Pain Assessment: 0-10 Pain Score: 8  Pain Location: 8/10 during TE's Pain Descriptors / Indicators: Aching;Cramping;Constant;Discomfort;Tightness Pain Intervention(s): Monitored during session;Repositioned;Ice applied    Home Living                      Prior Function            PT Goals (current goals can now be found in the care plan section) Progress towards PT goals: Progressing toward goals    Frequency  7X/week    PT Plan      Co-evaluation             End of Session Equipment Utilized During Treatment: Gait belt;Right knee immobilizer Activity Tolerance: Patient tolerated treatment well Patient left: in chair;with call bell/phone within reach;with family/visitor present     Time: 3790-2409 PT Time Calculation (min) (ACUTE ONLY): 31 min  Charges:  $Gait Training: 8-22 mins $Therapeutic Activity: 8-22 mins                    G Codes:      Rica Koyanagi  PTA  [facility]  Acute  Rehab Pager      [phone]

## 2014-12-14 NOTE — Discharge Instructions (Addendum)
Dr. Gaynelle Arabian Total Joint Specialist [facility] [address] [place], [place] [address] [phone]  TOTAL KNEE REPLACEMENT POSTOPERATIVE DIRECTIONS  Knee Rehabilitation, Guidelines Following Surgery  Results after knee surgery are often greatly improved when you follow the exercise, range of motion and muscle strengthening exercises prescribed by your doctor. Safety measures are also important to protect the knee from further injury. Any time any of these exercises cause you to have increased pain or swelling in your knee joint, decrease the amount until you are comfortable again and slowly increase them. If you have problems or questions, call your caregiver or physical therapist for advice.   HOME CARE INSTRUCTIONS  Remove items at home which could result in a fall. This includes throw rugs or furniture in walking pathways.   ICE to the affected knee every three hours for 30 minutes at a time and then as needed for pain and swelling.  Continue to use ice on the knee for pain and swelling from surgery. You may notice swelling that will progress down to the foot and ankle.  This is normal after surgery.  Elevate the leg when you are not up walking on it.    Continue to use the breathing machine which will help keep your temperature down.  It is common for your temperature to cycle up and down following surgery, especially at night when you are not up moving around and exerting yourself.  The breathing machine keeps your lungs expanded and your temperature down.  Do not place pillow under knee, focus on keeping the knee straight while resting  DIET You may resume your previous home diet once your are discharged from the hospital.  DRESSING / WOUND CARE / SHOWERING You may change your dressing 3-5 days after surgery.  Then change the dressing every day with sterile gauze.  Please use good hand washing techniques before changing the dressing.  Do not use any  lotions or creams on the incision until instructed by your surgeon. You may start showering once you are discharged home but do not submerge the incision under water. Just pat the incision dry and apply a dry gauze dressing on daily. Change the surgical dressing daily and reapply a dry dressing each time.  ACTIVITY Walk with your walker as instructed. Use walker as long as suggested by your caregivers. Avoid periods of inactivity such as sitting longer than an hour when not asleep. This helps prevent blood clots.  You may resume a sexual relationship in one month or when given the OK by your doctor.  You may return to work once you are cleared by your doctor.  Do not drive a car for 6 weeks or until released by you surgeon.  Do not drive while taking narcotics.  WEIGHT BEARING Weight bearing as tolerated with assist device (walker, cane, etc) as directed, use it as long as suggested by your surgeon or therapist, typically at least 4-6 weeks.  POSTOPERATIVE CONSTIPATION PROTOCOL Constipation - defined medically as fewer than three stools per week and severe constipation as less than one stool per week.  One of the most common issues patients have following surgery is constipation.  Even if you have a regular bowel pattern at home, your normal regimen is likely to be disrupted due to multiple reasons following surgery.  Combination of anesthesia, postoperative narcotics, change in appetite and fluid intake all can affect your bowels.  In order to avoid complications following surgery, here are some recommendations  in order to help you during your recovery period.  Colace (docusate) - Pick up an over-the-counter form of Colace or another stool softener and take twice a day as long as you are requiring postoperative pain medications.  Take with a full glass of water daily.  If you experience loose stools or diarrhea, hold the colace until you stool forms back up.  If your symptoms do not get better  within 1 week or if they get worse, check with your doctor.  Dulcolax (bisacodyl) - Pick up over-the-counter and take as directed by the product packaging as needed to assist with the movement of your bowels.  Take with a full glass of water.  Use this product as needed if not relieved by Colace only.   MiraLax (polyethylene glycol) - Pick up over-the-counter to have on hand.  MiraLax is a solution that will increase the amount of water in your bowels to assist with bowel movements.  Take as directed and can mix with a glass of water, juice, soda, coffee, or tea.  Take if you go more than two days without a movement. Do not use MiraLax more than once per day. Call your doctor if you are still constipated or irregular after using this medication for 7 days in a row.  If you continue to have problems with postoperative constipation, please contact the office for further assistance and recommendations.  If you experience "the worst abdominal pain ever" or develop nausea or vomiting, please contact the office immediatly for further recommendations for treatment.  ITCHING  If you experience itching with your medications, try taking only a single pain pill, or even half a pain pill at a time.  You can also use Benadryl over the counter for itching or also to help with sleep.   TED HOSE STOCKINGS Wear the elastic stockings on both legs for three weeks following surgery during the day but you may remove then at night for sleeping.  MEDICATIONS See your medication summary on the After Visit Summary that the nursing staff will review with you prior to discharge.  You may have some home medications which will be placed on hold until you complete the course of blood thinner medication.  It is important for you to complete the blood thinner medication as prescribed by your surgeon.  Continue your approved medications as instructed at time of discharge.  PRECAUTIONS If you experience chest pain or shortness  of breath - call 911 immediately for transfer to the hospital emergency department.  If you develop a fever greater that 101 F, purulent drainage from wound, increased redness or drainage from wound, foul odor from the wound/dressing, or calf pain - CONTACT YOUR SURGEON.                                                   FOLLOW-UP APPOINTMENTS Make sure you keep all of your appointments after your operation with your surgeon and caregivers. You should call the office at the above phone number and make an appointment for approximately two weeks after the date of your surgery or on the date instructed by your surgeon outlined in the "After Visit Summary".   RANGE OF MOTION AND STRENGTHENING EXERCISES  Rehabilitation of the knee is important following a knee injury or an operation. After just a few days of immobilization, the muscles  of the thigh which control the knee become weakened and shrink (atrophy). Knee exercises are designed to build up the tone and strength of the thigh muscles and to improve knee motion. Often times heat used for twenty to thirty minutes before working out will loosen up your tissues and help with improving the range of motion but do not use heat for the first two weeks following surgery. These exercises can be done on a training (exercise) mat, on the floor, on a table or on a bed. Use what ever works the best and is most comfortable for you Knee exercises include:  Leg Lifts - While your knee is still immobilized in a splint or cast, you can do straight leg raises. Lift the leg to 60 degrees, hold for 3 sec, and slowly lower the leg. Repeat 10-20 times 2-3 times daily. Perform this exercise against resistance later as your knee gets better.  Quad and Hamstring Sets - Tighten up the muscle on the front of the thigh (Quad) and hold for 5-10 sec. Repeat this 10-20 times hourly. Hamstring sets are done by pushing the foot backward against an object and holding for 5-10 sec. Repeat as  with quad sets.   Leg Slides: Lying on your back, slowly slide your foot toward your buttocks, bending your knee up off the floor (only go as far as is comfortable). Then slowly slide your foot back down until your leg is flat on the floor again.  Angel Wings: Lying on your back spread your legs to the side as far apart as you can without causing discomfort.  A rehabilitation program following serious knee injuries can speed recovery and prevent re-injury in the future due to weakened muscles. Contact your doctor or a physical therapist for more information on knee rehabilitation.   IF YOU ARE TRANSFERRED TO A SKILLED REHAB FACILITY If the patient is transferred to a skilled rehab facility following release from the hospital, a list of the current medications will be sent to the facility for the patient to continue.  When discharged from the skilled rehab facility, please have the facility set up the patient's [place] prior to being released. Also, the skilled facility will be responsible for providing the patient with their medications at time of release from the facility to include their pain medication, the muscle relaxants, and their blood thinner medication. If the patient is still at the rehab facility at time of the two week follow up appointment, the skilled rehab facility will also need to assist the patient in arranging follow up appointment in our office and any transportation needs.  MAKE SURE YOU:  Understand these instructions.  Get help right away if you are not doing well or get worse.    Pick up stool softner and laxative for home use following surgery while on pain medications. Do not submerge incision under water. Please use good hand washing techniques while changing dressing each day. May shower starting three days after surgery starting Thursday 12/16/2014. Please use a clean towel to pat the incision dry following showers. Continue to use ice for pain  and swelling after surgery. Do not use any lotions or creams on the incision until instructed by your surgeon.  Take Xarelto for two and a half more weeks, then discontinue Xarelto. Once the patient has completed the Xarelto, they may resume the 81 mg Aspirin.   Information on my medicine - XARELTO (Rivaroxaban)  This medication education was reviewed with me or  my healthcare representative as part of my discharge preparation.  The pharmacist that spoke with me during my hospital stay was:  Angela Adam [facility]  Why was Xarelto prescribed for you? Xarelto was prescribed for you to reduce the risk of blood clots forming after orthopedic surgery. The medical term for these abnormal blood clots is venous thromboembolism (VTE).  What do you need to know about xarelto ? Take your Xarelto ONCE DAILY at the same time every day. You may take it either with or without food.  If you have difficulty swallowing the tablet whole, you may crush it and mix in applesauce just prior to taking your dose.  Take Xarelto exactly as prescribed by your doctor and DO NOT stop taking Xarelto without talking to the doctor who prescribed the medication.  Stopping without other VTE prevention medication to take the place of Xarelto may increase your risk of developing a clot.  After discharge, you should have regular check-up appointments with your healthcare provider that is prescribing your Xarelto.    What do you do if you miss a dose? If you miss a dose, take it as soon as you remember on the same day then continue your regularly scheduled once daily regimen the next day. Do not take two doses of Xarelto on the same day.   Important Safety Information A possible side effect of Xarelto is bleeding. You should call your healthcare provider right away if you experience any of the following: ? Bleeding from an injury or your nose that does not stop. ? Unusual colored urine (red or dark brown) or  unusual colored stools (red or black). ? Unusual bruising for unknown reasons. ? A serious fall or if you hit your head (even if there is no bleeding).  Some medicines may interact with Xarelto and might increase your risk of bleeding while on Xarelto. To help avoid this, consult your healthcare provider or pharmacist prior to using any new prescription or non-prescription medications, including herbals, vitamins, non-steroidal anti-inflammatory drugs (NSAIDs) and supplements.  This website has more information on Xarelto: [email or web address]

## 2014-12-14 NOTE — Progress Notes (Signed)
Physical Therapy Treatment Patient Details Name: Debra Knight MRN: [id number] DOB: 15-Oct-1945 Today's Date: 12/14/2014    History of Present Illness RTKA    PT Comments    POD # 1 am session.  Applied KI as pt was unable to perform active SLR.  Assisted pt OOB to amb a limited distance then performed TKR TE's followed by ICE.   Follow Up Recommendations  Home health PT;Supervision/Assistance - 24 hour     Equipment Recommendations  Rolling walker with 5" wheels (youth)    Recommendations for Other Services       Precautions / Restrictions Precautions Precautions: Knee;Fall Precaution Comments: applied Ki as pt was unable to perform active SLR Required Braces or Orthoses: Knee Immobilizer - Right Knee Immobilizer - Right: Discontinue once straight leg raise with < 10 degree lag Restrictions Weight Bearing Restrictions: No Other Position/Activity Restrictions: WBAT    Mobility  Bed Mobility Overal bed mobility: Needs Assistance Bed Mobility: Supine to Sit     Supine to sit: Min assist     General bed mobility comments: support R leg plus increased time  Transfers Overall transfer level: Needs assistance Equipment used: Rolling walker (2 wheeled) Transfers: Sit to/from Stand Sit to Stand: Min assist;From elevated surface         General transfer comment: 25% cues for hand and R leg position plus increased time  Ambulation/Gait Ambulation/Gait assistance: Min assist Ambulation Distance (Feet): 22 Feet Assistive device: Rolling walker (2 wheeled) Gait Pattern/deviations: Step-to pattern;Decreased stance time - right;Trunk flexed Gait velocity: decreased   General Gait Details: great difficulty weight shifting and required increased time to complete distance.    Stairs            Wheelchair Mobility    Modified Rankin (Stroke Patients Only)       Balance                                    Cognition Arousal/Alertness:  Awake/alert Behavior During Therapy: WFL for tasks assessed/performed Overall Cognitive Status: Within Functional Limits for tasks assessed                      Exercises   Total Knee Replacement TE's 10 reps B LE ankle pumps 10 reps towel squeezes 10 reps knee presses 10 reps heel slides  10 reps SAQ's 10 reps SLR's 10 reps ABD Followed by ICE     General Comments        Pertinent Vitals/Pain Pain Assessment: 0-10 Pain Score: 8  Pain Location: 8/10 during TE's Pain Descriptors / Indicators: Aching;Cramping;Constant;Discomfort;Tightness Pain Intervention(s): Monitored during session;Repositioned;Ice applied    Home Living                      Prior Function            PT Goals (current goals can now be found in the care plan section) Progress towards PT goals: Progressing toward goals    Frequency  7X/week    PT Plan      Co-evaluation             End of Session Equipment Utilized During Treatment: Gait belt;Right knee immobilizer Activity Tolerance: Patient tolerated treatment well Patient left: in chair;with call bell/phone within reach;with family/visitor present     Time: 0867-6195 PT Time Calculation (min) (ACUTE ONLY): 41 min  Charges:  $Gait Training:  8-22 mins $Therapeutic Exercise: 8-22 mins $Therapeutic Activity: 8-22 mins                    G Codes:      Rica Koyanagi  PTA [facility]  Acute  Rehab Pager      [phone]

## 2014-12-14 NOTE — Progress Notes (Signed)
OT Cancellation Note  Patient Details Name: SHERYL SAINTIL MRN: [id number] DOB: 06-02-1946   Cancelled Treatment:    Reason Eval/Treat Not Completed: Pain limiting ability to participate. Pt states pain is 6/10 and she just finished PT not long ago and requests to hold off on OT right now. Pain meds requested already. Will check back later time.  Jules Schick  [phone] 12/14/2014, 12:04 PM

## 2014-12-15 LAB — CBC
HCT: 30.3 % — ABNORMAL LOW (ref 36.0–46.0)
HEMOGLOBIN: 9.8 g/dL — AB (ref 12.0–15.0)
MCH: 29.3 pg (ref 26.0–34.0)
MCHC: 32.3 g/dL (ref 30.0–36.0)
MCV: 90.4 fL (ref 78.0–100.0)
PLATELETS: 205 10*3/uL (ref 150–400)
RBC: 3.35 MIL/uL — AB (ref 3.87–5.11)
RDW: 14.3 % (ref 11.5–15.5)
WBC: 11.2 10*3/uL — ABNORMAL HIGH (ref 4.0–10.5)

## 2014-12-15 LAB — BASIC METABOLIC PANEL
ANION GAP: 9 (ref 5–15)
BUN: 7 mg/dL (ref 6–23)
CHLORIDE: 100 mmol/L (ref 96–112)
CO2: 26 mmol/L (ref 19–32)
Calcium: 8.2 mg/dL — ABNORMAL LOW (ref 8.4–10.5)
Creatinine, Ser: 0.51 mg/dL (ref 0.50–1.10)
GFR calc Af Amer: >90 mL/min (ref >90)
GFR calc non Af Amer: >90 mL/min (ref >90)
Glucose, Bld: 158 mg/dL — ABNORMAL HIGH (ref 70–99)
Potassium: 3.6 mmol/L (ref 3.5–5.1)
Sodium: 135 mmol/L (ref 135–145)

## 2014-12-15 MED ORDER — RIVAROXABAN 10 MG PO TABS
10.0000 mg | ORAL_TABLET | Freq: Every day | ORAL | Status: DC
Start: 2014-12-15 — End: 2015-09-15

## 2014-12-15 MED ORDER — OXYCODONE HCL 5 MG PO TABS: 5.0000 mg | ORAL_TABLET | ORAL | Status: DC | PRN

## 2014-12-15 MED ORDER — METHOCARBAMOL 500 MG PO TABS: 500.0000 mg | ORAL_TABLET | Freq: Four times a day (QID) | ORAL | Status: DC | PRN

## 2014-12-15 MED ORDER — TRAMADOL HCL 50 MG PO TABS: 50.0000 mg | ORAL_TABLET | Freq: Four times a day (QID) | ORAL | Status: DC | PRN

## 2014-12-15 NOTE — Discharge Summary (Signed)
Physician Discharge Summary   Patient ID: Debra Knight MRN: [id number] DOB/AGE: 06-01-1946 69 y.o.  Admit date: 12/13/2014 Discharge date: 12/15/2014  Primary Diagnosis:  Osteoarthritis Right knee(s)  Admission Diagnoses:  Past Medical History  Diagnosis Date  . Hyperlipidemia   . OSA (obstructive sleep apnea)   . Aortic stenosis     moderate on Echo 09/2013, Dr. Johnsie Cancel monitoring  . Carotid bruit     LICA 76-73% 12/1935 doppler "right"  . Osteoarthritis of both knees   . Vertigo 07/2014    8 weeks  . GERD (gastroesophageal reflux disease)   . Tinnitus   . Enlarged thyroid   . Seasonal allergies   . Family history of adverse reaction to anesthesia     "father- age 47 had confusion from anesthesia"  . Epiglottitis     hx of 40 years ago   Discharge Diagnoses:   Principal Problem:   OA (osteoarthritis) of knee  Estimated body mass index is 41.97 kg/(m^2) as calculated from the following:   Height as of this encounter: 5' 1"  (1.549 m).   Weight as of this encounter: 100.699 kg (222 lb).  Procedure:  Procedure(s) (LRB): RIGHT TOTAL KNEE ARTHROPLASTY (Right)   Consults: None  HPI: Debra Knight is a 69 y.o. year old female with end stage OA of her right knee with progressively worsening pain and dysfunction. She has constant pain, with activity and at rest and significant functional deficits with difficulties even with ADLs. She has had extensive non-op management including analgesics, injections of cortisone, and home exercise program, but remains in significant pain with significant dysfunction.Radiographs show bone on bone arthritis medial and patellofemoral. She presents now for right Total Knee Arthroplasty.   Laboratory Data: Admission on 12/13/2014, Discharged on 12/15/2014  Component Date Value Ref Range Status  . WBC 12/14/2014 8.5  4.0 - 10.5 K/uL Final  . RBC 12/14/2014 3.47* 3.87 - 5.11 MIL/uL Final  . Hemoglobin 12/14/2014 10.1* 12.0 - 15.0 g/dL Final    . HCT 12/14/2014 31.9* 36.0 - 46.0 % Final  . MCV 12/14/2014 91.9  78.0 - 100.0 fL Final  . MCH 12/14/2014 29.1  26.0 - 34.0 pg Final  . MCHC 12/14/2014 31.7  30.0 - 36.0 g/dL Final  . RDW 12/14/2014 14.5  11.5 - 15.5 % Final  . Platelets 12/14/2014 195  150 - 400 K/uL Final  . Sodium 12/14/2014 136  135 - 145 mmol/L Final  . Potassium 12/14/2014 3.7  3.5 - 5.1 mmol/L Final  . Chloride 12/14/2014 105  96 - 112 mmol/L Final  . CO2 12/14/2014 25  19 - 32 mmol/L Final  . Glucose, Bld 12/14/2014 163* 70 - 99 mg/dL Final  . BUN 12/14/2014 11  6 - 23 mg/dL Final  . Creatinine, Ser 12/14/2014 0.64  0.50 - 1.10 mg/dL Final  . Calcium 12/14/2014 8.1* 8.4 - 10.5 mg/dL Final  . GFR calc non Af Amer 12/14/2014 89* >90 mL/min Final  . GFR calc Af Amer 12/14/2014 >90  >90 mL/min Final   Comment: (NOTE) The eGFR has been calculated using the CKD EPI equation. This calculation has not been validated in all clinical situations. eGFR's persistently <90 mL/min signify possible Chronic Kidney Disease.   . Anion gap 12/14/2014 6  5 - 15 Final  . WBC 12/15/2014 11.2* 4.0 - 10.5 K/uL Final  . RBC 12/15/2014 3.35* 3.87 - 5.11 MIL/uL Final  . Hemoglobin 12/15/2014 9.8* 12.0 - 15.0 g/dL Final  . HCT 12/15/2014  30.3* 36.0 - 46.0 % Final  . MCV 12/15/2014 90.4  78.0 - 100.0 fL Final  . MCH 12/15/2014 29.3  26.0 - 34.0 pg Final  . MCHC 12/15/2014 32.3  30.0 - 36.0 g/dL Final  . RDW 12/15/2014 14.3  11.5 - 15.5 % Final  . Platelets 12/15/2014 205  150 - 400 K/uL Final  . Sodium 12/15/2014 135  135 - 145 mmol/L Final  . Potassium 12/15/2014 3.6  3.5 - 5.1 mmol/L Final  . Chloride 12/15/2014 100  96 - 112 mmol/L Final  . CO2 12/15/2014 26  19 - 32 mmol/L Final  . Glucose, Bld 12/15/2014 158* 70 - 99 mg/dL Final  . BUN 12/15/2014 7  6 - 23 mg/dL Final  . Creatinine, Ser 12/15/2014 0.51  0.50 - 1.10 mg/dL Final  . Calcium 12/15/2014 8.2* 8.4 - 10.5 mg/dL Final  . GFR calc non Af Amer 12/15/2014 >90  >90  mL/min Final  . GFR calc Af Amer 12/15/2014 >90  >90 mL/min Final   Comment: (NOTE) The eGFR has been calculated using the CKD EPI equation. This calculation has not been validated in all clinical situations. eGFR's persistently <90 mL/min signify possible Chronic Kidney Disease.   Georgiann Hahn gap 12/15/2014 9  5 - 15 Final  Hospital Outpatient Visit on 12/07/2014  Component Date Value Ref Range Status  . MRSA, PCR 12/07/2014 NEGATIVE  NEGATIVE Final  . Staphylococcus aureus 12/07/2014 POSITIVE* NEGATIVE Final   Comment:        The Xpert SA Assay (FDA approved for NASAL specimens in patients over 29 years of age), is one component of a comprehensive surveillance program.  Test performance has been validated by [facility] for patients greater than or equal to 57 year old. It is not intended to diagnose infection nor to guide or monitor treatment.   Marland Kitchen aPTT 12/07/2014 32  24 - 37 seconds Final  . WBC 12/07/2014 9.0  4.0 - 10.5 K/uL Final  . RBC 12/07/2014 4.82  3.87 - 5.11 MIL/uL Final  . Hemoglobin 12/07/2014 13.8  12.0 - 15.0 g/dL Final  . HCT 12/07/2014 43.2  36.0 - 46.0 % Final  . MCV 12/07/2014 89.6  78.0 - 100.0 fL Final  . MCH 12/07/2014 28.6  26.0 - 34.0 pg Final  . MCHC 12/07/2014 31.9  30.0 - 36.0 g/dL Final  . RDW 12/07/2014 14.1  11.5 - 15.5 % Final  . Platelets 12/07/2014 213  150 - 400 K/uL Final  . Sodium 12/07/2014 141  135 - 145 mmol/L Final  . Potassium 12/07/2014 3.9  3.5 - 5.1 mmol/L Final  . Chloride 12/07/2014 105  96 - 112 mmol/L Final  . CO2 12/07/2014 25  19 - 32 mmol/L Final  . Glucose, Bld 12/07/2014 103* 70 - 99 mg/dL Final  . BUN 12/07/2014 15  6 - 23 mg/dL Final  . Creatinine, Ser 12/07/2014 0.56  0.50 - 1.10 mg/dL Final  . Calcium 12/07/2014 8.9  8.4 - 10.5 mg/dL Final  . Total Protein 12/07/2014 7.1  6.0 - 8.3 g/dL Final  . Albumin 12/07/2014 4.2  3.5 - 5.2 g/dL Final  . AST 12/07/2014 20  0 - 37 U/L Final  . ALT 12/07/2014 20  0 - 35 U/L Final   . Alkaline Phosphatase 12/07/2014 89  39 - 117 U/L Final  . Total Bilirubin 12/07/2014 0.8  0.3 - 1.2 mg/dL Final  . GFR calc non Af Amer 12/07/2014 >90  >90 mL/min Final  .  GFR calc Af Amer 12/07/2014 >90  >90 mL/min Final   Comment: (NOTE) The eGFR has been calculated using the CKD EPI equation. This calculation has not been validated in all clinical situations. eGFR's persistently <90 mL/min signify possible Chronic Kidney Disease.   . Anion gap 12/07/2014 11  5 - 15 Final  . Prothrombin Time 12/07/2014 13.8  11.6 - 15.2 seconds Final  . INR 12/07/2014 1.05  0.00 - 1.49 Final  . Color, Urine 12/07/2014 YELLOW  YELLOW Final  . APPearance 12/07/2014 CLOUDY* CLEAR Final  . Specific Gravity, Urine 12/07/2014 1.028  1.005 - 1.030 Final  . pH 12/07/2014 5.5  5.0 - 8.0 Final  . Glucose, UA 12/07/2014 NEGATIVE  NEGATIVE mg/dL Final  . Hgb urine dipstick 12/07/2014 NEGATIVE  NEGATIVE Final  . Bilirubin Urine 12/07/2014 NEGATIVE  NEGATIVE Final  . Ketones, ur 12/07/2014 NEGATIVE  NEGATIVE mg/dL Final  . Protein, ur 12/07/2014 NEGATIVE  NEGATIVE mg/dL Final  . Urobilinogen, UA 12/07/2014 1.0  0.0 - 1.0 mg/dL Final  . Nitrite 12/07/2014 NEGATIVE  NEGATIVE Final  . Leukocytes, UA 12/07/2014 SMALL* NEGATIVE Final  . ABO/RH(D) 12/07/2014 A POS   Final  . Antibody Screen 12/07/2014 NEG   Final  . Sample Expiration 12/07/2014 12/16/2014   Final  . ABO/RH(D) 12/07/2014 A POS   Final  . Squamous Epithelial / LPF 12/07/2014 FEW* RARE Final  . WBC, UA 12/07/2014 11-20  <3 WBC/hpf Final  . RBC / HPF 12/07/2014 0-2  <3 RBC/hpf Final  . Bacteria, UA 12/07/2014 RARE  RARE Final     X-Rays:No results found.  EKG: Orders placed or performed in visit on 10/20/14  . EKG 12-Lead     Hospital Course: Debra Knight is a 69 y.o. who was admitted to [facility]. They were brought to the operating room on 12/13/2014 and underwent Procedure(s): RIGHT TOTAL KNEE ARTHROPLASTY.  Patient tolerated  the procedure well and was later transferred to the recovery room and then to the orthopaedic floor for postoperative care.  They were given PO and IV analgesics for pain control following their surgery.  They were given 24 hours of postoperative antibiotics of  Anti-infectives    Start     Dose/Rate Route Frequency Ordered Stop   12/13/14 2000  vancomycin (VANCOCIN) IVPB 1000 mg/200 mL premix     1,000 mg 200 mL/hr over 60 Minutes Intravenous Every 12 hours 12/13/14 1153 12/13/14 2133   12/13/14 0600  vancomycin (VANCOCIN) 1,500 mg in sodium chloride 0.9 % 500 mL IVPB     1,500 mg 250 mL/hr over 120 Minutes Intravenous On call to O.R. 12/12/14 1544 12/13/14 0735     and started on DVT prophylaxis in the form of Xarelto.   PT and OT were ordered for total joint protocol.  Discharge planning consulted to help with postop disposition and equipment needs.  Patient had a tough night on the evening of surgery with cramping but did get up and walk a few feet with therapy.  They started to get up OOB with therapy on day one. Hemovac drain was pulled without difficulty.  Continued to work with therapy into day two.  Dressing was changed on day two and the incision was healing well.  Patient was seen in rounds by Dr. Wynelle Link and was ready to go home.  Discharge home with home health Diet - Cardiac diet Follow up - in 2 weeks Activity - WBAT Disposition - Home Condition Upon Discharge - Pending at  time of rounds and note D/C Meds - See DC Summary DVT Prophylaxis - Xarelto       Discharge Instructions    Call MD / Call 911    Complete by:  As directed   If you experience chest pain or shortness of breath, CALL 911 and be transported to the hospital emergency room.  If you develope a fever above 101 F, pus (white drainage) or increased drainage or redness at the wound, or calf pain, call your surgeon's office.     Change dressing    Complete by:  As directed   Change dressing daily with sterile 4 x  4 inch gauze dressing and apply TED hose. Do not submerge the incision under water.     Constipation Prevention    Complete by:  As directed   Drink plenty of fluids.  Prune juice may be helpful.  You may use a stool softener, such as Colace (over the counter) 100 mg twice a day.  Use MiraLax (over the counter) for constipation as needed.     Diet - low sodium heart healthy    Complete by:  As directed      Discharge instructions    Complete by:  As directed   Pick up stool softner and laxative for home use following surgery while on pain medications. Do not submerge incision under water. Please use good hand washing techniques while changing dressing each day. May shower starting three days after surgery starting Thursday 12/16/2014 Please use a clean towel to pat the incision dry following showers. Continue to use ice for pain and swelling after surgery. Do not use any lotions or creams on the incision until instructed by your surgeon.  Take Xarelto for two and a half more weeks, then discontinue Xarelto. Once the patient has completed the Xarelto, they may resume the 81 mg Aspirin.  Postoperative Constipation Protocol  Constipation - defined medically as fewer than three stools per week and severe constipation as less than one stool per week.  One of the most common issues patients have following surgery is constipation.  Even if you have a regular bowel pattern at home, your normal regimen is likely to be disrupted due to multiple reasons following surgery.  Combination of anesthesia, postoperative narcotics, change in appetite and fluid intake all can affect your bowels.  In order to avoid complications following surgery, here are some recommendations in order to help you during your recovery period.  Colace (docusate) - Pick up an over-the-counter form of Colace or another stool softener and take twice a day as long as you are requiring postoperative pain medications.  Take with a full  glass of water daily.  If you experience loose stools or diarrhea, hold the colace until you stool forms back up.  If your symptoms do not get better within 1 week or if they get worse, check with your doctor.  Dulcolax (bisacodyl) - Pick up over-the-counter and take as directed by the product packaging as needed to assist with the movement of your bowels.  Take with a full glass of water.  Use this product as needed if not relieved by Colace only.   MiraLax (polyethylene glycol) - Pick up over-the-counter to have on hand.  MiraLax is a solution that will increase the amount of water in your bowels to assist with bowel movements.  Take as directed and can mix with a glass of water, juice, soda, coffee, or tea.  Take if you go more  than two days without a movement. Do not use MiraLax more than once per day. Call your doctor if you are still constipated or irregular after using this medication for 7 days in a row.  If you continue to have problems with postoperative constipation, please contact the office for further assistance and recommendations.  If you experience "the worst abdominal pain ever" or develop nausea or vomiting, please contact the office immediatly for further recommendations for treatment.     Do not put a pillow under the knee. Place it under the heel.    Complete by:  As directed      Do not sit on low chairs, stoools or toilet seats, as it may be difficult to get up from low surfaces    Complete by:  As directed      Driving restrictions    Complete by:  As directed   No driving until released by the physician.     Increase activity slowly as tolerated    Complete by:  As directed      Lifting restrictions    Complete by:  As directed   No lifting until released by the physician.     Patient may shower    Complete by:  As directed   You may shower without a dressing once there is no drainage.  Do not wash over the wound.  If drainage remains, do not shower until drainage  stops.     TED hose    Complete by:  As directed   Use stockings (TED hose) for 3 weeks on both leg(s).  You may remove them at night for sleeping.     Weight bearing as tolerated    Complete by:  As directed   Laterality:  right  Extremity:  Lower            Medication List    STOP taking these medications        aspirin 81 MG tablet     BIOTIN PO     GLUCOSAMINE 1500 COMPLEX PO     multivitamin tablet     naproxen sodium 220 MG tablet  Commonly known as:  ANAPROX      TAKE these medications        atorvastatin 40 MG tablet  Commonly known as:  LIPITOR  TAKE ONE TABLET BY MOUTH ONCE DAILY     hydrochlorothiazide 12.5 MG capsule  Commonly known as:  MICROZIDE  Take 1 capsule (12.5 mg total) by mouth daily.  Notes to Patient:  Start 12/16/14 Thursday     methocarbamol 500 MG tablet  Commonly known as:  ROBAXIN  Take 1 tablet (500 mg total) by mouth every 6 (six) hours as needed for muscle spasms.  Notes to Patient:  Muscle relaxer     omeprazole 20 MG tablet  Commonly known as:  PRILOSEC OTC  Take 20 mg by mouth daily.     oxyCODONE 5 MG immediate release tablet  Commonly known as:  Oxy IR/ROXICODONE  Take 1-2 tablets (5-10 mg total) by mouth every 3 (three) hours as needed for moderate pain, severe pain or breakthrough pain.  Notes to Patient:  Pain Medication     ranitidine 150 MG tablet  Commonly known as:  ZANTAC  Take 150 mg by mouth 2 (two) times daily.     rivaroxaban 10 MG Tabs tablet  Commonly known as:  XARELTO  - Take 1 tablet (10 mg total) by mouth daily with breakfast. Take  Xarelto for two and a half more weeks, then discontinue Xarelto.  - Once the patient has completed the Xarelto, they may resume the 81 mg Aspirin.  Notes to Patient:  Blood thinner     traMADol 50 MG tablet  Commonly known as:  ULTRAM  Take 1-2 tablets (50-100 mg total) by mouth every 6 (six) hours as needed (mild pain).  Notes to Patient:  Pain Medication        Follow-up Information    Follow up with [facility].   Why:  home health physical therapy   Contact information:   [place] [address] [phone]       Follow up with [facility].   Why:  youth walker   Contact information:   [address] [place] [address] [phone]       Follow up with [facility] [phone].   Why:  Call this number to secure a new Primary Care Physician      Follow up with [name], MD. Schedule an appointment as soon as possible for a visit on 12/28/2014.   Specialty:  Orthopedic Surgery   Why:  Call office at [phone] to setup appointment with Dr. Wynelle Link on Tuesday 12/28/2014.   Contact information:   [address] [place] [address] [place] [address] [phone]       Signed: Arlee Muslim, PA-C Orthopaedic Surgery 12/16/2014, 9:12 AM

## 2014-12-15 NOTE — Progress Notes (Signed)
Physical Therapy Treatment Patient Details Name: Debra Knight MRN: [id number] DOB: March 14, 1946 Today's Date: 12/15/2014    History of Present Illness RTKA    PT Comments    POD # 2; PM session; pt in bed; pt to EOB;ambulate 65 ft in unit hall; return to room perform TA; home exercises given to pt husband; Family education reinforced ambulating, taking off and on knee immobilizer; ascending to higher bed with stool; placed pt in bed and put ICE on R knee. Pt being D/C   Follow Up Recommendations  Home health PT;Supervision/Assistance - 24 hour     Equipment Recommendations  Rolling walker with 5" wheels    Recommendations for Other Services       Precautions / Restrictions Precautions Precautions: Knee;Fall Required Braces or Orthoses: Knee Immobilizer - Right Restrictions Weight Bearing Restrictions: No Other Position/Activity Restrictions: WBAT    Mobility  Bed Mobility Overal bed mobility: Needs Assistance Bed Mobility: Supine to Sit     Supine to sit: Min assist Sit to supine: Min assist   General bed mobility comments: Assist for R LE onto bed and off bed; included spouse education  Transfers Overall transfer level: Needs assistance Equipment used: Rolling walker (2 wheeled) Transfers: Sit to/from Stand Sit to Stand: Min assist         General transfer comment: verbal cues for hand placement.   Ambulation/Gait Ambulation/Gait assistance: Min assist Ambulation Distance (Feet): 65 Feet Assistive device: Rolling walker (2 wheeled) Gait Pattern/deviations: Step-through pattern;Trunk flexed;Decreased step length - right Gait velocity: decreased   General Gait Details: Needed cues for WB to increase on R LE; better with step through    Stairs            Wheelchair Mobility    Modified Rankin (Stroke Patients Only)       Balance                                    Cognition Arousal/Alertness: Awake/alert Behavior During  Therapy: WFL for tasks assessed/performed Overall Cognitive Status: Within Functional Limits for tasks assessed                      Exercises Total Joint Exercises Ankle Circles/Pumps: AROM;Both;20 reps;Supine Quad Sets: AROM;Right;10 reps;Supine Towel Squeeze: AROM;Both;10 reps;Supine Short Arc Quad: AROM;Right;10 reps;Supine Heel Slides: AAROM;Right;10 reps;Supine Hip ABduction/ADduction: AROM;Right;10 reps Straight Leg Raises: AAROM;Right;5 reps;Supine    General Comments        Pertinent Vitals/Pain Pain Assessment: 0-10 Pain Score: 4  Pain Location: R Knee Pain Descriptors / Indicators: Aching;Cramping Pain Intervention(s): Monitored during session;Repositioned;Ice applied    Home Living                      Prior Function            PT Goals (current goals can now be found in the care plan section) Progress towards PT goals: Progressing toward goals    Frequency  7X/week    PT Plan      Co-evaluation             End of Session Equipment Utilized During Treatment: Gait belt;Right knee immobilizer Activity Tolerance: Patient tolerated treatment well Patient left: in bed;with call bell/phone within reach;with family/visitor present     Time: 4098-1191 PT Time Calculation (min) (ACUTE ONLY): 25 min  Charges:  $Gait Training: 8-22 mins $Therapeutic Activity: 8-22  mins                    G Codes:      Duric,[place] Student PTA 12/15/2014, 3:52 PM  Rica Koyanagi  PTA [facility]  Acute  Rehab Pager      [phone]

## 2014-12-15 NOTE — Progress Notes (Signed)
   Subjective: 2 Days Post-Op Procedure(s) (LRB): RIGHT TOTAL KNEE ARTHROPLASTY (Right) Patient reports pain as mild.   Patient seen in rounds by Dr. Wynelle Link.  She was doing a little better this morning but needs at least two sessions of therapy today. If she does well with both sessions and meets goals, possible home later this afternoon.  If not, then probably tomorrow. Patient is well, but has had some minor complaints of pain in the knee, requiring pain medications Patient will work with therapy and will see how she does.  Objective: Vital signs in last 24 hours: Temp:  [98.3 F (36.8 C)-99.8 F (37.7 C)] 99.8 F (37.7 C) (04/13 0534) Pulse Rate:  [82-99] 89 (04/13 0534) Resp:  [16-18] 16 (04/13 0534) BP: (109-119)/(50-64) 110/64 mmHg (04/13 0534) SpO2:  [98 %-99 %] 99 % (04/13 0534)  Intake/Output from previous day:  Intake/Output Summary (Last 24 hours) at 12/15/14 0734 Last data filed at 12/15/14 0545  Gross per 24 hour  Intake   1078 ml  Output   1625 ml  Net   -547 ml    Intake/Output this shift: UOP 525 since around [place]  Labs:  Recent Labs  12/14/14 0431 12/15/14 0442  HGB 10.1* 9.8*    Recent Labs  12/14/14 0431 12/15/14 0442  WBC 8.5 11.2*  RBC 3.47* 3.35*  HCT 31.9* 30.3*  PLT 195 205    Recent Labs  12/14/14 0431 12/15/14 0442  NA 136 135  K 3.7 3.6  CL 105 100  CO2 25 26  BUN 11 7  CREATININE 0.64 0.51  GLUCOSE 163* 158*  CALCIUM 8.1* 8.2*   No results for input(s): LABPT, INR in the last 72 hours.  EXAM: General - Patient is Alert, Appropriate and Oriented Extremity - Neurovascular intact Sensation intact distally Dorsiflexion/Plantar flexion intact Incision - clean, dry, no drainage Motor Function - intact, moving foot and toes well on exam.   Assessment/Plan: 2 Days Post-Op Procedure(s) (LRB): RIGHT TOTAL KNEE ARTHROPLASTY (Right) Procedure(s) (LRB): RIGHT TOTAL KNEE ARTHROPLASTY (Right) Past Medical History  Diagnosis  Date  . Hyperlipidemia   . OSA (obstructive sleep apnea)   . Aortic stenosis     moderate on Echo 09/2013, Dr. Johnsie Cancel monitoring  . Carotid bruit     LICA 28-41% 11/2438 doppler "right"  . Osteoarthritis of both knees   . Vertigo 07/2014    8 weeks  . GERD (gastroesophageal reflux disease)   . Tinnitus   . Enlarged thyroid   . Seasonal allergies   . Family history of adverse reaction to anesthesia     "father- age 59 had confusion from anesthesia"  . Epiglottitis     hx of 40 years ago   Principal Problem:   OA (osteoarthritis) of knee  Estimated body mass index is 41.97 kg/(m^2) as calculated from the following:   Height as of this encounter: 5\' 1"  (1.549 m).   Weight as of this encounter: 100.699 kg (222 lb). Up with therapy Discharge home with home health Diet - Cardiac diet Follow up - in 2 weeks Activity - WBAT Disposition - Home Condition Upon Discharge - Pending at time of rounds and note D/C Meds - See DC Summary DVT Prophylaxis - Xarelto  Arlee Muslim, PA-C Orthopaedic Surgery 12/15/2014, 7:34 AM

## 2014-12-15 NOTE — Progress Notes (Signed)
Physical Therapy Treatment Patient Details Name: Debra Knight MRN: [id number] DOB: 12-09-1945 Today's Date: 12/15/2014    History of Present Illness RTKA    PT Comments    POD #2 AM session; Pt in chair; Pt c/o of nausea; ambulate from chair to BR; Ambulate from BR to unit hall 65ft; return to room via chair; pt stayed in chair w/ ice on knee   Follow Up Recommendations  Home health PT;Supervision/Assistance - 24 hour     Equipment Recommendations  Rolling walker with 5" wheels    Recommendations for Other Services       Precautions / Restrictions Precautions Precautions: Knee;Fall Precaution Comments: educated on KI use Required Braces or Orthoses: Knee Immobilizer - Right Knee Immobilizer - Right: Discontinue once straight leg raise with < 10 degree lag Restrictions Weight Bearing Restrictions: No Other Position/Activity Restrictions: WBAT    Mobility  Bed Mobility Overal bed mobility: Needs Assistance Bed Mobility: Supine to Sit     Supine to sit: Min assist Sit to supine: Min assist   General bed mobility comments: assist for R LE onto bed and off bed.   Transfers Overall transfer level: Needs assistance Equipment used: Rolling walker (2 wheeled) Transfers: Sit to/from Stand Sit to Stand: Min assist         General transfer comment: verbal cues for hand placement.   Ambulation/Gait Ambulation/Gait assistance: Min assist Ambulation Distance (Feet): 50 Feet Assistive device: Rolling walker (2 wheeled) Gait Pattern/deviations: Trunk flexed;Decreased stance time - right;Step-to pattern Gait velocity: decreased   General Gait Details: Waddle gait; difficulty weight shifting    Stairs            Wheelchair Mobility    Modified Rankin (Stroke Patients Only)       Balance                                    Cognition Arousal/Alertness: Awake/alert Behavior During Therapy: WFL for tasks assessed/performed Overall  Cognitive Status: Within Functional Limits for tasks assessed                      Exercises      General Comments        Pertinent Vitals/Pain Pain Assessment: No/denies pain (pt c/o of nausea ) Pain Score: 2  Pain Location: R knee Pain Descriptors / Indicators: Aching;Spasm Pain Intervention(s): Repositioned;Ice applied    Home Living Family/patient expects to be discharged to:: Private residence Living Arrangements: Spouse/significant other Available Help at Discharge: Family Type of Home: House Home Access: Stairs to enter Entrance Stairs-Rails: Right;Left Home Layout: One level Home Equipment: Environmental consultant - 2 wheels;Bedside commode      Prior Function Level of Independence: Independent          PT Goals (current goals can now be found in the care plan section) Acute Rehab PT Goals Patient Stated Goal: for nausea to improve Progress towards PT goals: Progressing toward goals    Frequency  7X/week    PT Plan      Co-evaluation             End of Session Equipment Utilized During Treatment: Gait belt;Right knee immobilizer Activity Tolerance: Patient tolerated treatment well Patient left: in chair;with call bell/phone within reach     Time:   1055-1120    Charges:  G Codes:      [id number] Student PTA  12/15/2014, 1:18 PM   Reviewed above  Rica Koyanagi  PTA [facility]  Acute  Rehab Pager      [phone]

## 2014-12-15 NOTE — Evaluation (Signed)
Occupational Therapy Evaluation Patient Details Name: Debra Knight MRN: [id number] DOB: 13-Jul-1946 Today's Date: 12/15/2014    History of Present Illness RTKA   Clinical Impression   Pt significantly limited by nausea this session. She declines requesting any nausea meds but did inform nursing of nausea limiting session. Able to dress from EOB/supine and standing and then pivoted to chair. Pt moves quickly and needs cues for safety. Do not feel pt is ready to d/c home from OT standpoint after this visit. Informed nursing of OT recommendation to consider one more day on acute. Will follow.    Follow Up Recommendations  Home health OT;Supervision/Assistance - 24 hour    Equipment Recommendations  None recommended by OT    Recommendations for Other Services       Precautions / Restrictions Precautions Precautions: Knee;Fall Precaution Comments: educated on KI use Required Braces or Orthoses: Knee Immobilizer - Right Knee Immobilizer - Right: Discontinue once straight leg raise with < 10 degree lag Restrictions Weight Bearing Restrictions: No Other Position/Activity Restrictions: WBAT      Mobility Bed Mobility Overal bed mobility: Needs Assistance Bed Mobility: Supine to Sit     Supine to sit: Min assist Sit to supine: Min assist   General bed mobility comments: assist for R LE onto bed and off bed.   Transfers Overall transfer level: Needs assistance Equipment used: Rolling walker (2 wheeled)   Sit to Stand: Min assist         General transfer comment: verbal cues for hand placement.     Balance                                            ADL Overall ADL's : Needs assistance/impaired Eating/Feeding: Independent;Sitting   Grooming: Wash/dry hands;Set up;Sitting   Upper Body Bathing: Set up;Sitting   Lower Body Bathing: Moderate assistance;Sit to/from stand   Upper Body Dressing : Set up;Sitting   Lower Body Dressing: Moderate  assistance;Sit to/from stand;Sitting/lateral leans;With adaptive equipment   Toilet Transfer: Minimal assistance;Stand-pivot;RW   Toileting- Clothing Manipulation and Hygiene: Minimal assistance;Sit to/from stand         General ADL Comments: Pt limited by nausea during session. Took increased time for pt to be able to complete dressing task at EOB. Had to lie down at one point due to nausea and pt proceeded to pull up pants from supine via bridging hips. After several minutes of rest, pt transferred to EOB and was able to pivot with walker to chair, still limited by nausa. Did practice some with pulling up pants further in standing and pt is impulsive with most of activity. Informed nursing that nausea persisted during session and pt not able to tolerate all planned tasks. Educated on KI wear and how to don/doff and when to wear KI. Pt practiced using reacher to doff sock, don underwear and pants, Educated on sock aid use, long handle shoe horn and sponge also. Pt states she has the sock aid, reacher and shoe horn and isnt interested in the sponge currently.     Vision     Perception     Praxis      Pertinent Vitals/Pain Pain Assessment: 0-10 Pain Score: 2  Pain Location: R knee Pain Descriptors / Indicators: Aching;Spasm Pain Intervention(s): Repositioned;Ice applied     Hand Dominance     Extremity/Trunk Assessment Upper Extremity Assessment Upper  Extremity Assessment: Overall WFL for tasks assessed           Communication Communication Communication: No difficulties   Cognition Arousal/Alertness: Awake/alert Behavior During Therapy: Anxious Overall Cognitive Status: Within Functional Limits for tasks assessed                     General Comments       Exercises       Shoulder Instructions      Home Living Family/patient expects to be discharged to:: Private residence Living Arrangements: Spouse/significant other Available Help at Discharge:  Family Type of Home: House Home Access: Stairs to enter Technical brewer of Steps: 3 Entrance Stairs-Rails: [place]: One level     Bathroom Shower/Tub: Occupational psychologist: [place]: Environmental consultant - 2 wheels;Bedside commode          Prior Functioning/Environment Level of Independence: Independent             OT Diagnosis: Generalized weakness   OT Problem List: Decreased strength;Decreased knowledge of use of DME or AE;Decreased activity tolerance   OT Treatment/Interventions:      OT Goals(Current goals can be found in the care plan section) Acute Rehab OT Goals Patient Stated Goal: for nausea to improve OT Goal Formulation: With patient Time For Goal Achievement: 12/22/14 Potential to Achieve Goals: Good  OT Frequency:     Barriers to D/C:            Co-evaluation              End of Session Equipment Utilized During Treatment: Gait belt;Rolling walker;Right knee immobilizer CPM Right Knee CPM Right Knee: Off  Activity Tolerance: Other (comment) (limited by nausea) Patient left: in chair;with call bell/phone within reach   Time: 7290-2111 OT Time Calculation (min): 49 min Charges:  OT General Charges $OT Visit: 1 Procedure OT Evaluation $Initial OT Evaluation Tier I: 1 Procedure OT Treatments $Self Care/Home Management : 8-22 mins $Therapeutic Activity: 8-22 mins G-Codes:    Jules Schick  [phone] 12/15/2014, 10:45 AM

## 2014-12-15 NOTE — Progress Notes (Signed)
Occupational Therapy Treatment Patient Details Name: Debra Knight MRN: [id number] DOB: [birth year] Today's Date: 12/15/2014    History of present illness RTKA   OT comments  Pt is making progress with OT.  Feel she would benefit from [facility] to reinforce safety with adls and bathroom transfers.  Husband present for education  Follow Up Recommendations  Home health OT;Supervision/Assistance - 24 hour    Equipment Recommendations  None recommended by OT    Recommendations for Other Services      Precautions / Restrictions Precautions Precautions: Knee;Fall Required Braces or Orthoses: Knee Immobilizer - Right Restrictions Weight Bearing Restrictions: No Other Position/Activity Restrictions: WBAT       Mobility Bed Mobility        Supine to sit and sit to supine:  Min A. Assist for RLE          Transfers Overall transfer level: Needs assistance Equipment used: Rolling walker (2 wheeled) Transfers: Sit to/from Stand Sit to Stand: Min guard         General transfer comment: verbal cues for hand placement.     Balance                                   ADL                           Toilet Transfer: Min guard;Ambulation;BSC   Toileting- Clothing Manipulation and Hygiene: Set up;Sitting/lateral lean         General ADL Comments: Performed shower transfer at min guard level.  Explained to husband and pt placement of 3:1 commode in shower.  Explained use and application of KI and to remove once she is sitting on commode in shower and to reapply prior to standing up and getting out of shower.  Demonstrated pillow under calf to keep heel free and use of one at side of leg to position leg out of external rotation.  Pt verbalizes understanding of AE.  cues given for safety in bathroom to keep walker in front of her as she turns and to not lift walker around obstacles.  Pt states that she doesn't have any tight spaces at home.      Vision                      Perception     Praxis      Cognition   Behavior During Therapy: WFL for tasks assessed/performed Overall Cognitive Status: Within Functional Limits for tasks assessed                       Extremity/Trunk Assessment               Exercises     Shoulder Instructions       General Comments      Pertinent Vitals/ Pain       Pain Assessment: 0-10 Pain Score: 4  Pain Location: R Knee Pain Descriptors / Indicators: Aching;Cramping Pain Intervention(s): Monitored during session;Repositioned;Ice applied  Home Living                                          Prior Functioning/Environment              Frequency Min 2X/week  Progress Toward Goals  OT Goals(current goals can now be found in the care plan section)  Progress towards OT goals: Progressing toward goals     Plan Frequency remains appropriate    Co-evaluation                 End of Session     Activity Tolerance Other (comment) (pt felt nauseous at end of session)   Patient Left in bed;with call bell/phone within reach;with family/visitor present   Nurse Communication          Time: 5790-3833 OT Time Calculation (min): 17 min  Charges: OT General Charges $OT Visit: 1 Procedure OT Treatments $Self Care/Home Management : 8-22 mins  Alida Greiner 12/15/2014, 3:45 PM   Lesle Chris, OTR/L [phone] 12/15/2014

## 2014-12-16 DIAGNOSIS — Z7901 Long term (current) use of anticoagulants: Secondary | ICD-10-CM | POA: Diagnosis not present

## 2014-12-16 DIAGNOSIS — M1711 Unilateral primary osteoarthritis, right knee: Secondary | ICD-10-CM | POA: Diagnosis not present

## 2014-12-16 DIAGNOSIS — Z471 Aftercare following joint replacement surgery: Secondary | ICD-10-CM | POA: Diagnosis not present

## 2014-12-16 DIAGNOSIS — Z87891 Personal history of nicotine dependence: Secondary | ICD-10-CM | POA: Diagnosis not present

## 2014-12-16 DIAGNOSIS — Z9181 History of falling: Secondary | ICD-10-CM | POA: Diagnosis not present

## 2014-12-16 DIAGNOSIS — I10 Essential (primary) hypertension: Secondary | ICD-10-CM | POA: Diagnosis not present

## 2014-12-17 DIAGNOSIS — Z471 Aftercare following joint replacement surgery: Secondary | ICD-10-CM | POA: Diagnosis not present

## 2014-12-17 DIAGNOSIS — Z7901 Long term (current) use of anticoagulants: Secondary | ICD-10-CM | POA: Diagnosis not present

## 2014-12-17 DIAGNOSIS — Z9181 History of falling: Secondary | ICD-10-CM | POA: Diagnosis not present

## 2014-12-17 DIAGNOSIS — Z87891 Personal history of nicotine dependence: Secondary | ICD-10-CM | POA: Diagnosis not present

## 2014-12-17 DIAGNOSIS — M1711 Unilateral primary osteoarthritis, right knee: Secondary | ICD-10-CM | POA: Diagnosis not present

## 2014-12-17 DIAGNOSIS — I10 Essential (primary) hypertension: Secondary | ICD-10-CM | POA: Diagnosis not present

## 2014-12-20 DIAGNOSIS — Z87891 Personal history of nicotine dependence: Secondary | ICD-10-CM | POA: Diagnosis not present

## 2014-12-20 DIAGNOSIS — I10 Essential (primary) hypertension: Secondary | ICD-10-CM | POA: Diagnosis not present

## 2014-12-20 DIAGNOSIS — Z7901 Long term (current) use of anticoagulants: Secondary | ICD-10-CM | POA: Diagnosis not present

## 2014-12-20 DIAGNOSIS — Z9181 History of falling: Secondary | ICD-10-CM | POA: Diagnosis not present

## 2014-12-20 DIAGNOSIS — Z471 Aftercare following joint replacement surgery: Secondary | ICD-10-CM | POA: Diagnosis not present

## 2014-12-20 DIAGNOSIS — M1711 Unilateral primary osteoarthritis, right knee: Secondary | ICD-10-CM | POA: Diagnosis not present

## 2014-12-22 DIAGNOSIS — M1711 Unilateral primary osteoarthritis, right knee: Secondary | ICD-10-CM | POA: Diagnosis not present

## 2014-12-22 DIAGNOSIS — Z7901 Long term (current) use of anticoagulants: Secondary | ICD-10-CM | POA: Diagnosis not present

## 2014-12-22 DIAGNOSIS — Z471 Aftercare following joint replacement surgery: Secondary | ICD-10-CM | POA: Diagnosis not present

## 2014-12-22 DIAGNOSIS — Z87891 Personal history of nicotine dependence: Secondary | ICD-10-CM | POA: Diagnosis not present

## 2014-12-22 DIAGNOSIS — I10 Essential (primary) hypertension: Secondary | ICD-10-CM | POA: Diagnosis not present

## 2014-12-22 DIAGNOSIS — Z9181 History of falling: Secondary | ICD-10-CM | POA: Diagnosis not present

## 2014-12-23 DIAGNOSIS — M1711 Unilateral primary osteoarthritis, right knee: Secondary | ICD-10-CM | POA: Diagnosis not present

## 2014-12-23 DIAGNOSIS — Z471 Aftercare following joint replacement surgery: Secondary | ICD-10-CM | POA: Diagnosis not present

## 2014-12-23 DIAGNOSIS — Z87891 Personal history of nicotine dependence: Secondary | ICD-10-CM | POA: Diagnosis not present

## 2014-12-23 DIAGNOSIS — Z9181 History of falling: Secondary | ICD-10-CM | POA: Diagnosis not present

## 2014-12-23 DIAGNOSIS — I10 Essential (primary) hypertension: Secondary | ICD-10-CM | POA: Diagnosis not present

## 2014-12-23 DIAGNOSIS — Z7901 Long term (current) use of anticoagulants: Secondary | ICD-10-CM | POA: Diagnosis not present

## 2014-12-24 DIAGNOSIS — R29898 Other symptoms and signs involving the musculoskeletal system: Secondary | ICD-10-CM | POA: Diagnosis not present

## 2014-12-24 DIAGNOSIS — Z96651 Presence of right artificial knee joint: Secondary | ICD-10-CM | POA: Diagnosis not present

## 2014-12-24 DIAGNOSIS — R269 Unspecified abnormalities of gait and mobility: Secondary | ICD-10-CM | POA: Diagnosis not present

## 2014-12-24 DIAGNOSIS — M25661 Stiffness of right knee, not elsewhere classified: Secondary | ICD-10-CM | POA: Diagnosis not present

## 2014-12-24 DIAGNOSIS — Z5189 Encounter for other specified aftercare: Secondary | ICD-10-CM | POA: Diagnosis not present

## 2014-12-24 DIAGNOSIS — M25561 Pain in right knee: Secondary | ICD-10-CM | POA: Diagnosis not present

## 2014-12-27 DIAGNOSIS — Z96651 Presence of right artificial knee joint: Secondary | ICD-10-CM | POA: Diagnosis not present

## 2014-12-27 DIAGNOSIS — M25561 Pain in right knee: Secondary | ICD-10-CM | POA: Diagnosis not present

## 2014-12-27 DIAGNOSIS — R29898 Other symptoms and signs involving the musculoskeletal system: Secondary | ICD-10-CM | POA: Diagnosis not present

## 2014-12-27 DIAGNOSIS — R269 Unspecified abnormalities of gait and mobility: Secondary | ICD-10-CM | POA: Diagnosis not present

## 2014-12-27 DIAGNOSIS — M25661 Stiffness of right knee, not elsewhere classified: Secondary | ICD-10-CM | POA: Diagnosis not present

## 2014-12-27 DIAGNOSIS — Z5189 Encounter for other specified aftercare: Secondary | ICD-10-CM | POA: Diagnosis not present

## 2014-12-28 DIAGNOSIS — Z96651 Presence of right artificial knee joint: Secondary | ICD-10-CM | POA: Diagnosis not present

## 2014-12-28 DIAGNOSIS — Z471 Aftercare following joint replacement surgery: Secondary | ICD-10-CM | POA: Diagnosis not present

## 2014-12-29 DIAGNOSIS — Z5189 Encounter for other specified aftercare: Secondary | ICD-10-CM | POA: Diagnosis not present

## 2014-12-29 DIAGNOSIS — R29898 Other symptoms and signs involving the musculoskeletal system: Secondary | ICD-10-CM | POA: Diagnosis not present

## 2014-12-29 DIAGNOSIS — Z96651 Presence of right artificial knee joint: Secondary | ICD-10-CM | POA: Diagnosis not present

## 2014-12-29 DIAGNOSIS — M25661 Stiffness of right knee, not elsewhere classified: Secondary | ICD-10-CM | POA: Diagnosis not present

## 2014-12-29 DIAGNOSIS — R269 Unspecified abnormalities of gait and mobility: Secondary | ICD-10-CM | POA: Diagnosis not present

## 2014-12-29 DIAGNOSIS — M25561 Pain in right knee: Secondary | ICD-10-CM | POA: Diagnosis not present

## 2014-12-31 DIAGNOSIS — Z96651 Presence of right artificial knee joint: Secondary | ICD-10-CM | POA: Diagnosis not present

## 2014-12-31 DIAGNOSIS — R269 Unspecified abnormalities of gait and mobility: Secondary | ICD-10-CM | POA: Diagnosis not present

## 2014-12-31 DIAGNOSIS — Z5189 Encounter for other specified aftercare: Secondary | ICD-10-CM | POA: Diagnosis not present

## 2014-12-31 DIAGNOSIS — R29898 Other symptoms and signs involving the musculoskeletal system: Secondary | ICD-10-CM | POA: Diagnosis not present

## 2014-12-31 DIAGNOSIS — M25561 Pain in right knee: Secondary | ICD-10-CM | POA: Diagnosis not present

## 2014-12-31 DIAGNOSIS — M25661 Stiffness of right knee, not elsewhere classified: Secondary | ICD-10-CM | POA: Diagnosis not present

## 2015-01-03 DIAGNOSIS — Z96651 Presence of right artificial knee joint: Secondary | ICD-10-CM | POA: Diagnosis not present

## 2015-01-03 DIAGNOSIS — M25661 Stiffness of right knee, not elsewhere classified: Secondary | ICD-10-CM | POA: Diagnosis not present

## 2015-01-03 DIAGNOSIS — R29898 Other symptoms and signs involving the musculoskeletal system: Secondary | ICD-10-CM | POA: Diagnosis not present

## 2015-01-03 DIAGNOSIS — R269 Unspecified abnormalities of gait and mobility: Secondary | ICD-10-CM | POA: Diagnosis not present

## 2015-01-03 DIAGNOSIS — M25561 Pain in right knee: Secondary | ICD-10-CM | POA: Diagnosis not present

## 2015-01-05 DIAGNOSIS — R29898 Other symptoms and signs involving the musculoskeletal system: Secondary | ICD-10-CM | POA: Diagnosis not present

## 2015-01-05 DIAGNOSIS — Z96651 Presence of right artificial knee joint: Secondary | ICD-10-CM | POA: Diagnosis not present

## 2015-01-05 DIAGNOSIS — R269 Unspecified abnormalities of gait and mobility: Secondary | ICD-10-CM | POA: Diagnosis not present

## 2015-01-05 DIAGNOSIS — M25661 Stiffness of right knee, not elsewhere classified: Secondary | ICD-10-CM | POA: Diagnosis not present

## 2015-01-05 DIAGNOSIS — M25561 Pain in right knee: Secondary | ICD-10-CM | POA: Diagnosis not present

## 2015-01-07 DIAGNOSIS — M25661 Stiffness of right knee, not elsewhere classified: Secondary | ICD-10-CM | POA: Diagnosis not present

## 2015-01-07 DIAGNOSIS — R29898 Other symptoms and signs involving the musculoskeletal system: Secondary | ICD-10-CM | POA: Diagnosis not present

## 2015-01-07 DIAGNOSIS — Z96651 Presence of right artificial knee joint: Secondary | ICD-10-CM | POA: Diagnosis not present

## 2015-01-07 DIAGNOSIS — R269 Unspecified abnormalities of gait and mobility: Secondary | ICD-10-CM | POA: Diagnosis not present

## 2015-01-07 DIAGNOSIS — M25561 Pain in right knee: Secondary | ICD-10-CM | POA: Diagnosis not present

## 2015-01-11 DIAGNOSIS — M25561 Pain in right knee: Secondary | ICD-10-CM | POA: Diagnosis not present

## 2015-01-11 DIAGNOSIS — R269 Unspecified abnormalities of gait and mobility: Secondary | ICD-10-CM | POA: Diagnosis not present

## 2015-01-11 DIAGNOSIS — M25661 Stiffness of right knee, not elsewhere classified: Secondary | ICD-10-CM | POA: Diagnosis not present

## 2015-01-11 DIAGNOSIS — R29898 Other symptoms and signs involving the musculoskeletal system: Secondary | ICD-10-CM | POA: Diagnosis not present

## 2015-01-11 DIAGNOSIS — Z96651 Presence of right artificial knee joint: Secondary | ICD-10-CM | POA: Diagnosis not present

## 2015-01-14 DIAGNOSIS — R29898 Other symptoms and signs involving the musculoskeletal system: Secondary | ICD-10-CM | POA: Diagnosis not present

## 2015-01-14 DIAGNOSIS — Z96651 Presence of right artificial knee joint: Secondary | ICD-10-CM | POA: Diagnosis not present

## 2015-01-14 DIAGNOSIS — R269 Unspecified abnormalities of gait and mobility: Secondary | ICD-10-CM | POA: Diagnosis not present

## 2015-01-14 DIAGNOSIS — M25661 Stiffness of right knee, not elsewhere classified: Secondary | ICD-10-CM | POA: Diagnosis not present

## 2015-01-14 DIAGNOSIS — M25561 Pain in right knee: Secondary | ICD-10-CM | POA: Diagnosis not present

## 2015-01-18 DIAGNOSIS — Z96651 Presence of right artificial knee joint: Secondary | ICD-10-CM | POA: Diagnosis not present

## 2015-01-18 DIAGNOSIS — Z471 Aftercare following joint replacement surgery: Secondary | ICD-10-CM | POA: Diagnosis not present

## 2015-01-24 DIAGNOSIS — R29898 Other symptoms and signs involving the musculoskeletal system: Secondary | ICD-10-CM | POA: Diagnosis not present

## 2015-01-24 DIAGNOSIS — R269 Unspecified abnormalities of gait and mobility: Secondary | ICD-10-CM | POA: Diagnosis not present

## 2015-01-24 DIAGNOSIS — M25561 Pain in right knee: Secondary | ICD-10-CM | POA: Diagnosis not present

## 2015-01-24 DIAGNOSIS — Z96651 Presence of right artificial knee joint: Secondary | ICD-10-CM | POA: Diagnosis not present

## 2015-01-24 DIAGNOSIS — M25661 Stiffness of right knee, not elsewhere classified: Secondary | ICD-10-CM | POA: Diagnosis not present

## 2015-01-26 DIAGNOSIS — R29898 Other symptoms and signs involving the musculoskeletal system: Secondary | ICD-10-CM | POA: Diagnosis not present

## 2015-01-26 DIAGNOSIS — R269 Unspecified abnormalities of gait and mobility: Secondary | ICD-10-CM | POA: Diagnosis not present

## 2015-01-26 DIAGNOSIS — Z96651 Presence of right artificial knee joint: Secondary | ICD-10-CM | POA: Diagnosis not present

## 2015-01-26 DIAGNOSIS — M25561 Pain in right knee: Secondary | ICD-10-CM | POA: Diagnosis not present

## 2015-01-26 DIAGNOSIS — M25661 Stiffness of right knee, not elsewhere classified: Secondary | ICD-10-CM | POA: Diagnosis not present

## 2015-01-28 DIAGNOSIS — R29898 Other symptoms and signs involving the musculoskeletal system: Secondary | ICD-10-CM | POA: Diagnosis not present

## 2015-01-28 DIAGNOSIS — R269 Unspecified abnormalities of gait and mobility: Secondary | ICD-10-CM | POA: Diagnosis not present

## 2015-01-28 DIAGNOSIS — Z96651 Presence of right artificial knee joint: Secondary | ICD-10-CM | POA: Diagnosis not present

## 2015-01-28 DIAGNOSIS — M25561 Pain in right knee: Secondary | ICD-10-CM | POA: Diagnosis not present

## 2015-01-28 DIAGNOSIS — M25661 Stiffness of right knee, not elsewhere classified: Secondary | ICD-10-CM | POA: Diagnosis not present

## 2015-02-01 DIAGNOSIS — R269 Unspecified abnormalities of gait and mobility: Secondary | ICD-10-CM | POA: Diagnosis not present

## 2015-02-01 DIAGNOSIS — M25561 Pain in right knee: Secondary | ICD-10-CM | POA: Diagnosis not present

## 2015-02-01 DIAGNOSIS — Z96651 Presence of right artificial knee joint: Secondary | ICD-10-CM | POA: Diagnosis not present

## 2015-02-01 DIAGNOSIS — R29898 Other symptoms and signs involving the musculoskeletal system: Secondary | ICD-10-CM | POA: Diagnosis not present

## 2015-02-01 DIAGNOSIS — M25661 Stiffness of right knee, not elsewhere classified: Secondary | ICD-10-CM | POA: Diagnosis not present

## 2015-02-03 DIAGNOSIS — R29898 Other symptoms and signs involving the musculoskeletal system: Secondary | ICD-10-CM | POA: Diagnosis not present

## 2015-02-03 DIAGNOSIS — M25561 Pain in right knee: Secondary | ICD-10-CM | POA: Diagnosis not present

## 2015-02-03 DIAGNOSIS — M25661 Stiffness of right knee, not elsewhere classified: Secondary | ICD-10-CM | POA: Diagnosis not present

## 2015-02-03 DIAGNOSIS — R269 Unspecified abnormalities of gait and mobility: Secondary | ICD-10-CM | POA: Diagnosis not present

## 2015-02-03 DIAGNOSIS — Z96651 Presence of right artificial knee joint: Secondary | ICD-10-CM | POA: Diagnosis not present

## 2015-02-03 DIAGNOSIS — Z471 Aftercare following joint replacement surgery: Secondary | ICD-10-CM | POA: Diagnosis not present

## 2015-02-08 DIAGNOSIS — Z471 Aftercare following joint replacement surgery: Secondary | ICD-10-CM | POA: Diagnosis not present

## 2015-02-08 DIAGNOSIS — M25661 Stiffness of right knee, not elsewhere classified: Secondary | ICD-10-CM | POA: Diagnosis not present

## 2015-02-08 DIAGNOSIS — M25561 Pain in right knee: Secondary | ICD-10-CM | POA: Diagnosis not present

## 2015-02-08 DIAGNOSIS — R29898 Other symptoms and signs involving the musculoskeletal system: Secondary | ICD-10-CM | POA: Diagnosis not present

## 2015-02-08 DIAGNOSIS — R269 Unspecified abnormalities of gait and mobility: Secondary | ICD-10-CM | POA: Diagnosis not present

## 2015-02-08 DIAGNOSIS — Z96651 Presence of right artificial knee joint: Secondary | ICD-10-CM | POA: Diagnosis not present

## 2015-02-11 DIAGNOSIS — R269 Unspecified abnormalities of gait and mobility: Secondary | ICD-10-CM | POA: Diagnosis not present

## 2015-02-11 DIAGNOSIS — M25561 Pain in right knee: Secondary | ICD-10-CM | POA: Diagnosis not present

## 2015-02-11 DIAGNOSIS — Z96651 Presence of right artificial knee joint: Secondary | ICD-10-CM | POA: Diagnosis not present

## 2015-02-11 DIAGNOSIS — Z471 Aftercare following joint replacement surgery: Secondary | ICD-10-CM | POA: Diagnosis not present

## 2015-02-11 DIAGNOSIS — R29898 Other symptoms and signs involving the musculoskeletal system: Secondary | ICD-10-CM | POA: Diagnosis not present

## 2015-02-11 DIAGNOSIS — M25661 Stiffness of right knee, not elsewhere classified: Secondary | ICD-10-CM | POA: Diagnosis not present

## 2015-02-12 IMAGING — US US SOFT TISSUE HEAD/NECK
1 series · 13 of 25 positions shown · non-contrast
Comparison: None.

CLINICAL DATA: Goiter

EXAM:
THYROID ULTRASOUND
TECHNIQUE: Ultrasound examination of the thyroid gland and adjacent soft
tissues was performed.

[Series 1: us soft tissue head/neck · 0.07mm/px · 13 of 49 slices shown]
[im 1/49]
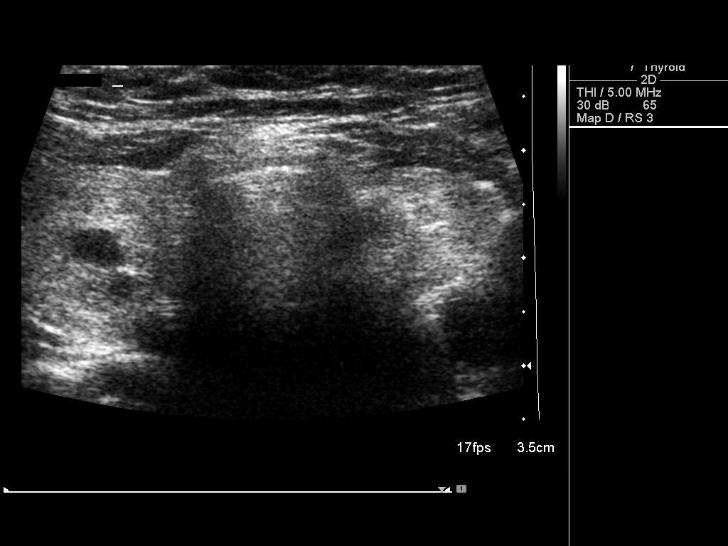
[im 5/49]
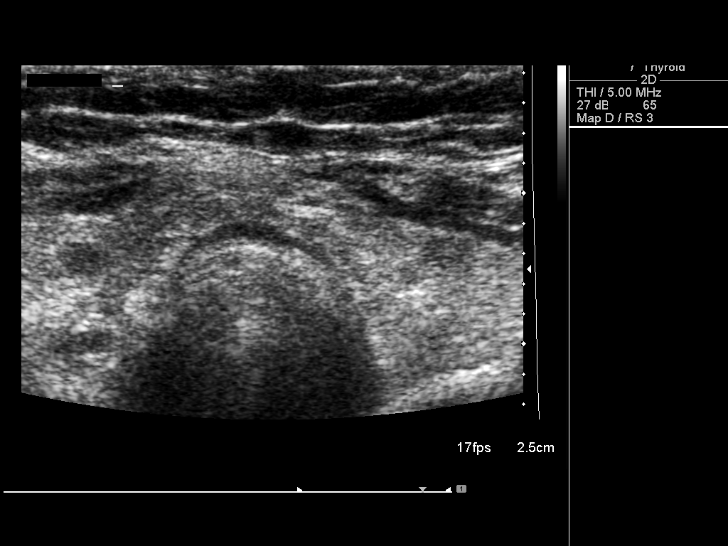
[im 9/49]
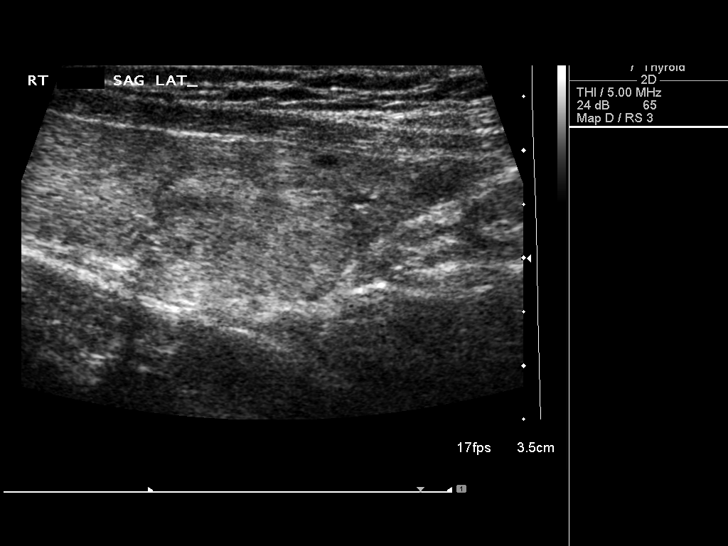
[im 13/49]
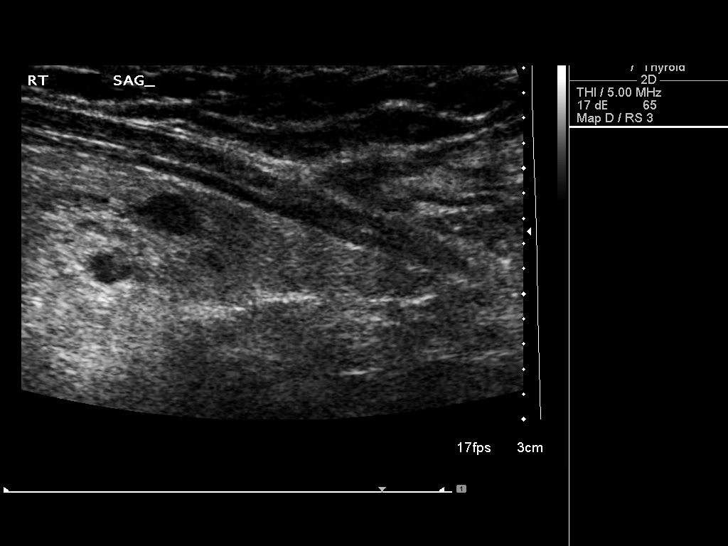
[im 17/49]
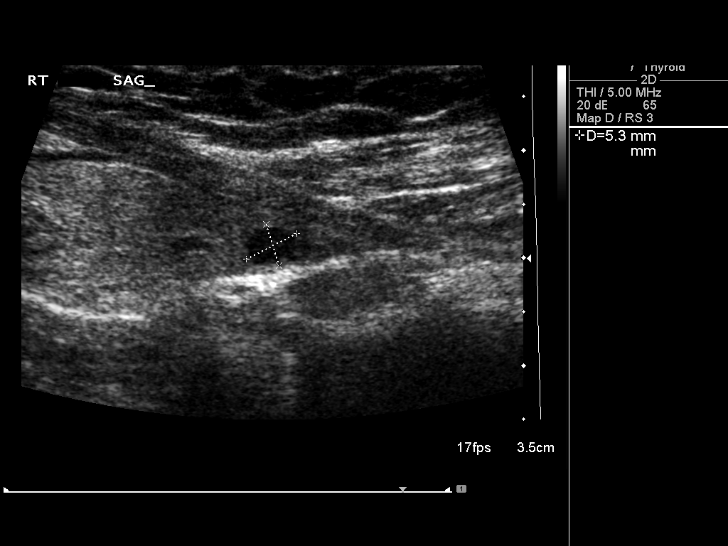
[im 21/49]
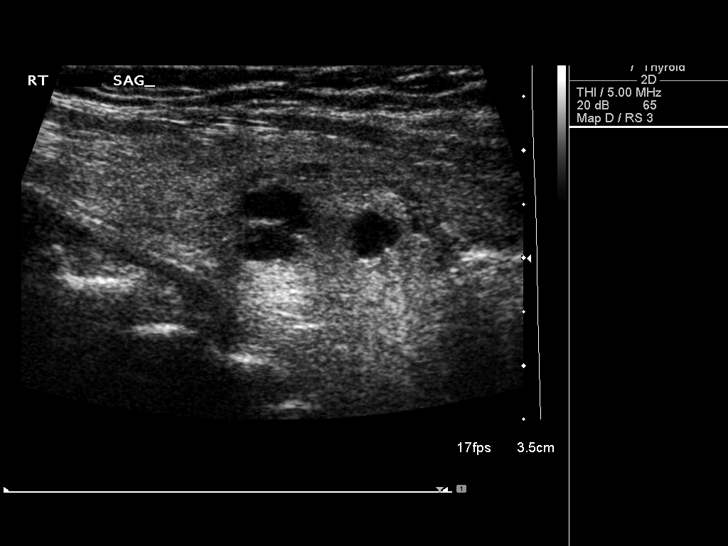
[im 25/49]
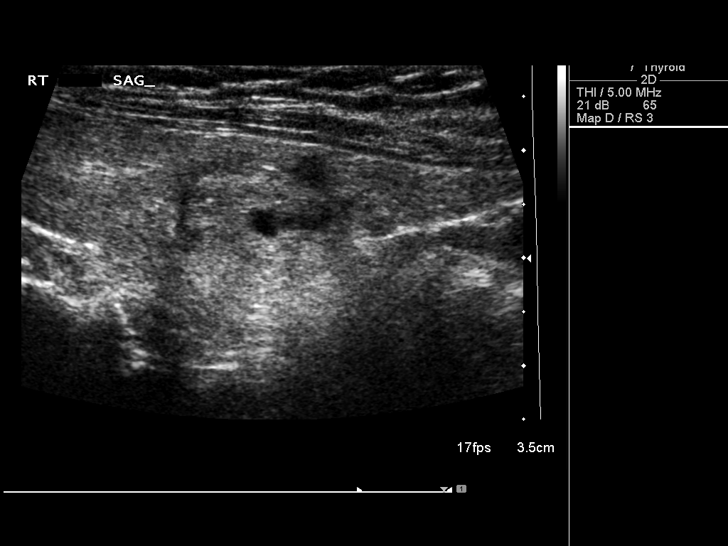
[im 29/49]
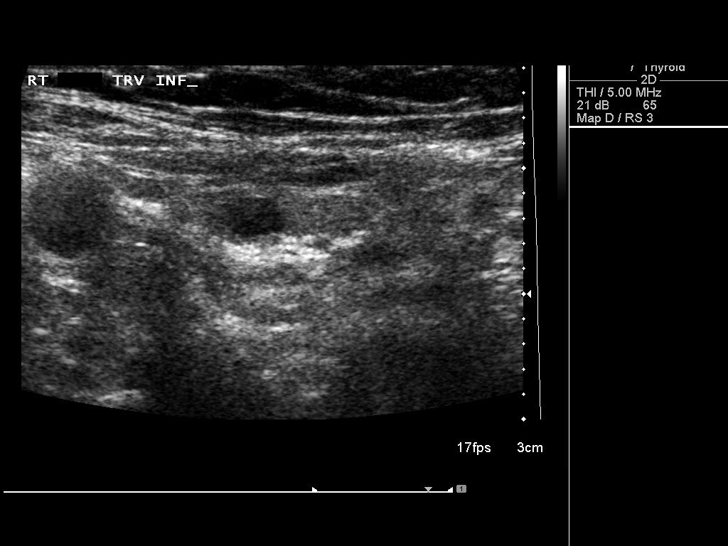
[im 33/49]
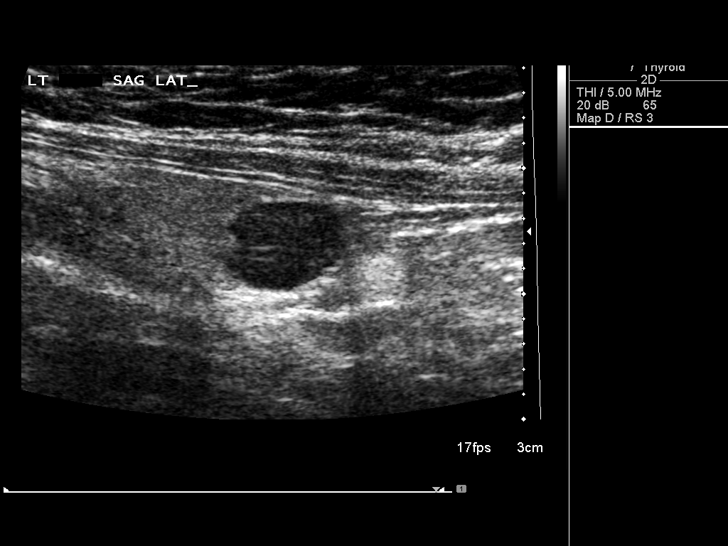
[im 37/49]
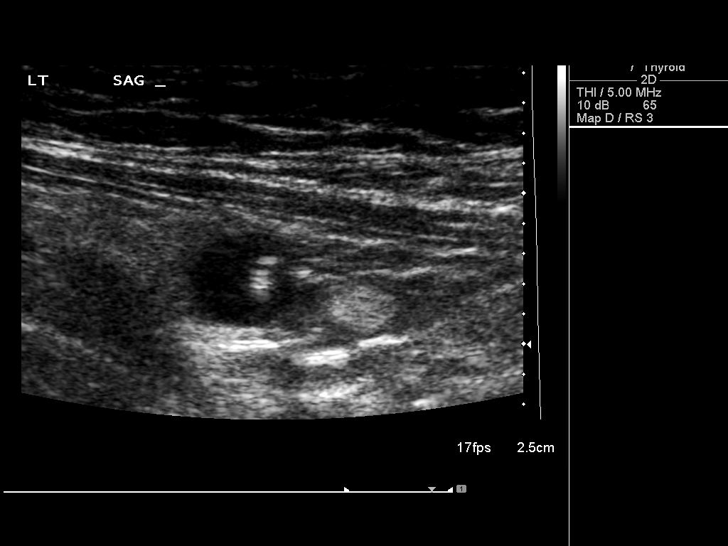
[im 41/49]
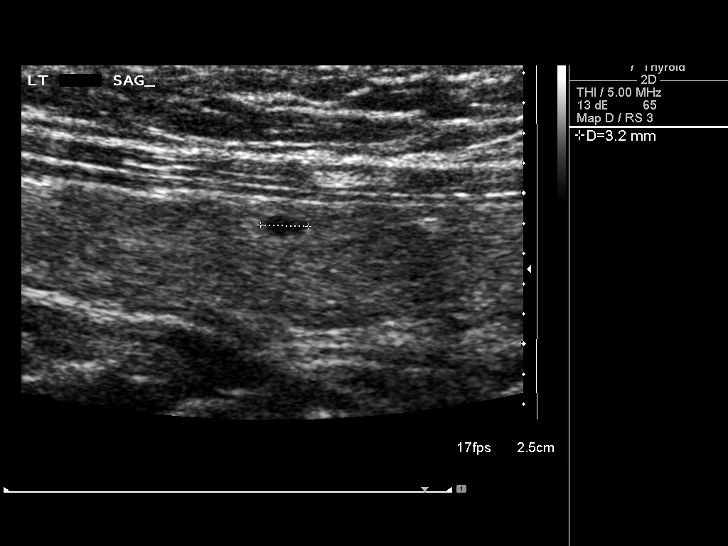
[im 45/49]
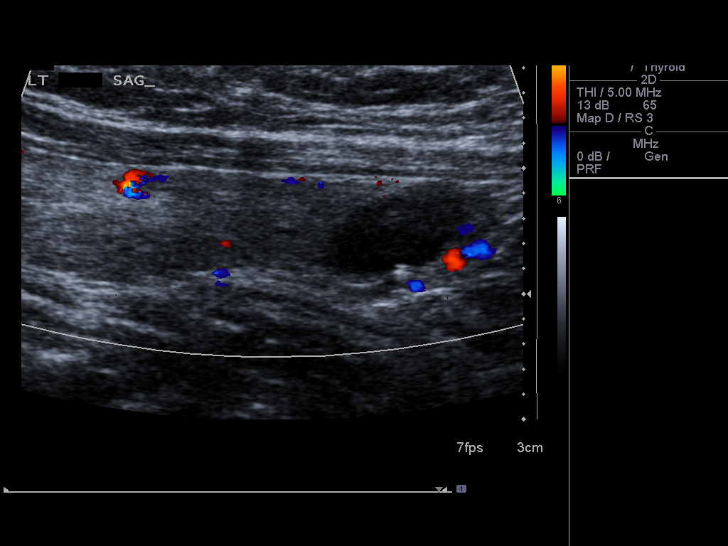
[im 49/49]
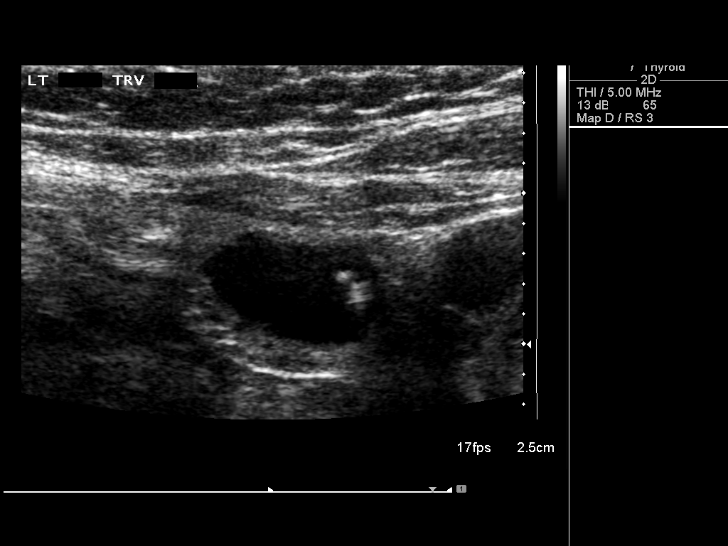

[13 of 25 positions shown; findings below may reference images not displayed]

FINDINGS: Right thyroid lobe

Measurements: 4.3 x 2.2 x 2 cm. In the midpole there is a complex
appearing hypoechoic nodule measuring 2 x 1.3 x 1.4 cm. Just
inferior to this there is a 0.5 cm diameter hypoechoic nodule. In
the lower pole there is a 0.6 cm diameter hypoechoic nodule.

Left thyroid lobe

Measurements: 4.7 x 1.2 x 1.3 cm..  No nodules visualized.

Isthmus

Thickness: 0.4 cm. In the upper pole there is a 0.3 mm diameter
hypoechoic to anechoic nodule. In the lower pole there is a 1 x
x 1.2 cm diameter hypoechoic focus with internal echoes.

Lymphadenopathy

None visualized.
IMPRESSION: Both thyroid lobes demonstrate dominant non simple appearing cystic
structures demonstrating internal echoes. A echogenic calcification
is demonstrated in the lower pole nodule on the left.

The findings meet consensus criteria for biopsy. Ultrasound-guided
fine needle aspiration should be considered, as per the consensus
statement: Management of Thyroid Nodules Detected at US: Society of
Radiologists in Ultrasound Consensus Conference Statement. Radiology

## 2015-02-14 DIAGNOSIS — M25661 Stiffness of right knee, not elsewhere classified: Secondary | ICD-10-CM | POA: Diagnosis not present

## 2015-02-14 DIAGNOSIS — Z471 Aftercare following joint replacement surgery: Secondary | ICD-10-CM | POA: Diagnosis not present

## 2015-02-14 DIAGNOSIS — Z96651 Presence of right artificial knee joint: Secondary | ICD-10-CM | POA: Diagnosis not present

## 2015-02-14 DIAGNOSIS — M25561 Pain in right knee: Secondary | ICD-10-CM | POA: Diagnosis not present

## 2015-02-14 DIAGNOSIS — R29898 Other symptoms and signs involving the musculoskeletal system: Secondary | ICD-10-CM | POA: Diagnosis not present

## 2015-02-14 DIAGNOSIS — R269 Unspecified abnormalities of gait and mobility: Secondary | ICD-10-CM | POA: Diagnosis not present

## 2015-02-16 ENCOUNTER — Ambulatory Visit: Payer: Medicare Other | Admitting: Cardiovascular Disease

## 2015-02-18 DIAGNOSIS — Z471 Aftercare following joint replacement surgery: Secondary | ICD-10-CM | POA: Diagnosis not present

## 2015-02-18 DIAGNOSIS — R29898 Other symptoms and signs involving the musculoskeletal system: Secondary | ICD-10-CM | POA: Diagnosis not present

## 2015-02-18 DIAGNOSIS — Z96651 Presence of right artificial knee joint: Secondary | ICD-10-CM | POA: Diagnosis not present

## 2015-02-18 DIAGNOSIS — R269 Unspecified abnormalities of gait and mobility: Secondary | ICD-10-CM | POA: Diagnosis not present

## 2015-02-18 DIAGNOSIS — M25661 Stiffness of right knee, not elsewhere classified: Secondary | ICD-10-CM | POA: Diagnosis not present

## 2015-02-18 DIAGNOSIS — M25561 Pain in right knee: Secondary | ICD-10-CM | POA: Diagnosis not present

## 2015-02-28 DIAGNOSIS — S8002XA Contusion of left knee, initial encounter: Secondary | ICD-10-CM | POA: Diagnosis not present

## 2015-02-28 DIAGNOSIS — Z880 Allergy status to penicillin: Secondary | ICD-10-CM | POA: Diagnosis not present

## 2015-02-28 DIAGNOSIS — S0990XA Unspecified injury of head, initial encounter: Secondary | ICD-10-CM | POA: Diagnosis not present

## 2015-02-28 DIAGNOSIS — S20219A Contusion of unspecified front wall of thorax, initial encounter: Secondary | ICD-10-CM | POA: Diagnosis not present

## 2015-02-28 DIAGNOSIS — Z885 Allergy status to narcotic agent status: Secondary | ICD-10-CM | POA: Diagnosis not present

## 2015-02-28 DIAGNOSIS — Z7982 Long term (current) use of aspirin: Secondary | ICD-10-CM | POA: Diagnosis not present

## 2015-02-28 DIAGNOSIS — S298XXA Other specified injuries of thorax, initial encounter: Secondary | ICD-10-CM | POA: Diagnosis not present

## 2015-02-28 DIAGNOSIS — Z886 Allergy status to analgesic agent status: Secondary | ICD-10-CM | POA: Diagnosis not present

## 2015-02-28 DIAGNOSIS — M7989 Other specified soft tissue disorders: Secondary | ICD-10-CM | POA: Diagnosis not present

## 2015-02-28 DIAGNOSIS — R079 Chest pain, unspecified: Secondary | ICD-10-CM | POA: Diagnosis not present

## 2015-02-28 DIAGNOSIS — S8001XA Contusion of right knee, initial encounter: Secondary | ICD-10-CM | POA: Diagnosis not present

## 2015-02-28 DIAGNOSIS — Z87891 Personal history of nicotine dependence: Secondary | ICD-10-CM | POA: Diagnosis not present

## 2015-02-28 DIAGNOSIS — Z888 Allergy status to other drugs, medicaments and biological substances status: Secondary | ICD-10-CM | POA: Diagnosis not present

## 2015-02-28 DIAGNOSIS — S8011XA Contusion of right lower leg, initial encounter: Secondary | ICD-10-CM | POA: Diagnosis not present

## 2015-03-01 DIAGNOSIS — Z471 Aftercare following joint replacement surgery: Secondary | ICD-10-CM | POA: Diagnosis not present

## 2015-03-01 DIAGNOSIS — Z96651 Presence of right artificial knee joint: Secondary | ICD-10-CM | POA: Diagnosis not present
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
## 2015-03-16 ENCOUNTER — Encounter: Payer: Self-pay | Admitting: Cardiovascular Disease

## 2015-03-16 ENCOUNTER — Ambulatory Visit (INDEPENDENT_AMBULATORY_CARE_PROVIDER_SITE_OTHER): Payer: Medicare Other | Admitting: Cardiovascular Disease

## 2015-03-16 ENCOUNTER — Other Ambulatory Visit: Payer: Self-pay | Admitting: *Deleted

## 2015-03-16 VITALS — BP 130/62 | HR 87 | Ht 61.0 in | Wt 207.0 lb

## 2015-03-16 DIAGNOSIS — Z79899 Other long term (current) drug therapy: Secondary | ICD-10-CM | POA: Diagnosis not present

## 2015-03-16 DIAGNOSIS — I6523 Occlusion and stenosis of bilateral carotid arteries: Secondary | ICD-10-CM

## 2015-03-16 DIAGNOSIS — E785 Hyperlipidemia, unspecified: Secondary | ICD-10-CM | POA: Diagnosis not present

## 2015-03-16 DIAGNOSIS — I350 Nonrheumatic aortic (valve) stenosis: Secondary | ICD-10-CM | POA: Diagnosis not present

## 2015-03-16 LAB — LIPID PANEL
CHOL/HDL RATIO: 6
CHOLESTEROL: 259 mg/dL — AB (ref 0–200)
HDL: 44.9 mg/dL (ref >39.00)
NonHDL: 214.1
Triglycerides: 206 mg/dL — ABNORMAL HIGH (ref 0.0–149.0)
VLDL: 41.2 mg/dL — ABNORMAL HIGH (ref 0.0–40.0)

## 2015-03-16 LAB — HEPATIC FUNCTION PANEL
ALT: 16 U/L (ref 0–35)
AST: 14 U/L (ref 0–37)
Albumin: 4.2 g/dL (ref 3.5–5.2)
Alkaline Phosphatase: 232 U/L — ABNORMAL HIGH (ref 39–117)
BILIRUBIN TOTAL: 0.6 mg/dL (ref 0.2–1.2)
Bilirubin, Direct: 0.1 mg/dL (ref 0.0–0.3)
TOTAL PROTEIN: 7.8 g/dL (ref 6.0–8.3)

## 2015-03-16 LAB — LDL CHOLESTEROL, DIRECT: Direct LDL: 177 mg/dL

## 2015-03-16 NOTE — Patient Instructions (Addendum)
Medication Instructions:  NO CHANGES  Labwork: FASTING LIPID LIVER  TODAY   Testing/Procedures: Your physician has requested that you have an echocardiogram. Echocardiography is a painless test that uses sound waves to create images of your heart. It provides your doctor with information about the size and shape of your heart and how well your heart's chambers and valves are working. This procedure takes approximately one hour. There are no restrictions for this procedure.  DUE IN AUGUST   Follow-Up: Your physician wants you to follow-up in: [place] will receive a reminder letter in the mail two months in advance. If you don't receive a letter, please call our office to schedule the follow-up appointment.   Any Other Special Instructions Will Be Listed Below (If Applicable).

## 2015-03-18 ENCOUNTER — Telehealth: Payer: Self-pay | Admitting: *Deleted

## 2015-03-18 ENCOUNTER — Other Ambulatory Visit: Payer: Self-pay | Admitting: *Deleted

## 2015-03-18 DIAGNOSIS — E785 Hyperlipidemia, unspecified: Secondary | ICD-10-CM

## 2015-03-18 DIAGNOSIS — Z79899 Other long term (current) drug therapy: Secondary | ICD-10-CM

## 2015-03-18 MED ORDER — ATORVASTATIN CALCIUM 40 MG PO TABS: 40.0000 mg | ORAL_TABLET | Freq: Every day | ORAL | Status: DC

## 2015-03-18 NOTE — Telephone Encounter (Signed)
Take lipitor 40 mg daily and don't miss any doses F/u lipids in 3 months with continuous Rx

## 2015-03-18 NOTE — Telephone Encounter (Signed)
Patient called and stated that she has been off of the atorvastatin for over a week. Please advise on what her current therapy should be. Thanks, MI

## 2015-03-18 NOTE — Telephone Encounter (Signed)
Could you put in the orders for lipid panel three months out? Thanks, MI

## 2015-03-18 NOTE — Telephone Encounter (Signed)
Tried once again to contact patient but was unable. Rx sent in with a note to return for labs.

## 2015-03-18 NOTE — Telephone Encounter (Signed)
LMOM for patient to call the office. 

## 2015-03-24 NOTE — Telephone Encounter (Signed)
SEE RESULTS ON LAB  .Adonis Housekeeper

## 2015-04-11 ENCOUNTER — Other Ambulatory Visit (HOSPITAL_COMMUNITY): Payer: No Typology Code available for payment source

## 2015-04-27 ENCOUNTER — Other Ambulatory Visit: Payer: Self-pay

## 2015-04-27 ENCOUNTER — Ambulatory Visit (HOSPITAL_COMMUNITY): Payer: Medicare Other | Attending: Cardiology

## 2015-04-27 DIAGNOSIS — E785 Hyperlipidemia, unspecified: Secondary | ICD-10-CM | POA: Diagnosis not present

## 2015-04-27 DIAGNOSIS — I059 Rheumatic mitral valve disease, unspecified: Secondary | ICD-10-CM | POA: Diagnosis not present

## 2015-04-27 DIAGNOSIS — Z87891 Personal history of nicotine dependence: Secondary | ICD-10-CM | POA: Diagnosis not present

## 2015-04-27 DIAGNOSIS — I517 Cardiomegaly: Secondary | ICD-10-CM | POA: Insufficient documentation

## 2015-04-27 DIAGNOSIS — I350 Nonrheumatic aortic (valve) stenosis: Secondary | ICD-10-CM | POA: Insufficient documentation

## 2015-04-27 DIAGNOSIS — I10 Essential (primary) hypertension: Secondary | ICD-10-CM | POA: Diagnosis not present

## 2015-04-27 DIAGNOSIS — G4733 Obstructive sleep apnea (adult) (pediatric): Secondary | ICD-10-CM | POA: Insufficient documentation

## 2015-06-20 ENCOUNTER — Other Ambulatory Visit: Payer: No Typology Code available for payment source

## 2015-06-22 ENCOUNTER — Encounter: Payer: Self-pay | Admitting: *Deleted

## 2015-06-28 DIAGNOSIS — Z23 Encounter for immunization: Secondary | ICD-10-CM | POA: Diagnosis not present

## 2015-07-14 ENCOUNTER — Other Ambulatory Visit: Payer: Self-pay | Admitting: *Deleted

## 2015-07-14 ENCOUNTER — Other Ambulatory Visit (INDEPENDENT_AMBULATORY_CARE_PROVIDER_SITE_OTHER): Payer: Medicare Other | Admitting: *Deleted

## 2015-07-14 ENCOUNTER — Other Ambulatory Visit: Payer: Self-pay

## 2015-07-14 DIAGNOSIS — Z79899 Other long term (current) drug therapy: Secondary | ICD-10-CM

## 2015-07-14 DIAGNOSIS — E785 Hyperlipidemia, unspecified: Secondary | ICD-10-CM

## 2015-07-14 LAB — HEPATIC FUNCTION PANEL
ALBUMIN: 4 g/dL (ref 3.6–5.1)
ALT: 31 U/L — ABNORMAL HIGH (ref 6–29)
AST: 25 U/L (ref 10–35)
Alkaline Phosphatase: 141 U/L — ABNORMAL HIGH (ref 33–130)
BILIRUBIN TOTAL: 0.5 mg/dL (ref 0.2–1.2)
Bilirubin, Direct: 0.1 mg/dL (ref <=0.2)
Indirect Bilirubin: 0.4 mg/dL (ref 0.2–1.2)
Total Protein: 7.3 g/dL (ref 6.1–8.1)

## 2015-07-14 LAB — LIPID PANEL
CHOL/HDL RATIO: 5 ratio (ref <=5.0)
Cholesterol: 196 mg/dL (ref 125–200)
HDL: 39 mg/dL — AB (ref >=46)
LDL CALC: 103 mg/dL (ref <130)
Triglycerides: 268 mg/dL — ABNORMAL HIGH (ref <150)
VLDL: 54 mg/dL — AB (ref <30)

## 2015-07-14 MED ORDER — ATORVASTATIN CALCIUM 40 MG PO TABS: 40.0000 mg | ORAL_TABLET | Freq: Every day | ORAL | Status: DC

## 2015-07-14 NOTE — Addendum Note (Signed)
Addended by: Eulis Foster on: 07/14/2015 02:12 PM   Modules accepted: Orders

## 2015-07-15 ENCOUNTER — Other Ambulatory Visit: Payer: Medicare Other | Admitting: *Deleted

## 2015-07-15 DIAGNOSIS — E785 Hyperlipidemia, unspecified: Secondary | ICD-10-CM

## 2015-07-15 LAB — LIPID PANEL
CHOL/HDL RATIO: 5.1 ratio — AB (ref <=5.0)
CHOLESTEROL: 216 mg/dL — AB (ref 125–200)
HDL: 42 mg/dL — ABNORMAL LOW (ref >=46)
LDL Cholesterol: 129 mg/dL (ref <130)
Triglycerides: 225 mg/dL — ABNORMAL HIGH (ref <150)
VLDL: 45 mg/dL — ABNORMAL HIGH (ref <30)

## 2015-07-18 ENCOUNTER — Telehealth: Payer: Self-pay | Admitting: Cardiovascular Disease

## 2015-07-18 DIAGNOSIS — E785 Hyperlipidemia, unspecified: Secondary | ICD-10-CM

## 2015-07-18 DIAGNOSIS — Z79899 Other long term (current) drug therapy: Secondary | ICD-10-CM

## 2015-07-18 MED ORDER — ATORVASTATIN CALCIUM 40 MG PO TABS: 40.0000 mg | ORAL_TABLET | Freq: Every day | ORAL | Status: DC

## 2015-07-18 NOTE — Telephone Encounter (Signed)
New message ° ° ° ° ° ° °Calling to get lab results °

## 2015-07-18 NOTE — Telephone Encounter (Signed)
PT AWARE OF LAB RESULTS./CY 

## 2015-08-12 ENCOUNTER — Other Ambulatory Visit: Payer: Self-pay | Admitting: *Deleted
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
## 2015-09-13 ENCOUNTER — Other Ambulatory Visit: Payer: Medicare Other

## 2015-09-13 ENCOUNTER — Encounter: Payer: Medicare Other | Admitting: Cardiovascular Disease
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
## 2015-09-15 ENCOUNTER — Encounter: Payer: Self-pay | Admitting: Cardiovascular Disease

## 2015-09-15 ENCOUNTER — Other Ambulatory Visit (INDEPENDENT_AMBULATORY_CARE_PROVIDER_SITE_OTHER): Payer: Medicare Other | Admitting: *Deleted

## 2015-09-15 ENCOUNTER — Ambulatory Visit (INDEPENDENT_AMBULATORY_CARE_PROVIDER_SITE_OTHER): Payer: Medicare Other | Admitting: Cardiovascular Disease

## 2015-09-15 VITALS — BP 140/70 | HR 72 | Ht 61.0 in | Wt 222.0 lb

## 2015-09-15 DIAGNOSIS — Z79899 Other long term (current) drug therapy: Secondary | ICD-10-CM | POA: Diagnosis not present

## 2015-09-15 DIAGNOSIS — E785 Hyperlipidemia, unspecified: Secondary | ICD-10-CM

## 2015-09-15 DIAGNOSIS — I350 Nonrheumatic aortic (valve) stenosis: Secondary | ICD-10-CM | POA: Diagnosis not present

## 2015-09-15 LAB — HEPATIC FUNCTION PANEL
ALK PHOS: 110 U/L (ref 33–130)
ALT: 25 U/L (ref 6–29)
AST: 22 U/L (ref 10–35)
Albumin: 3.9 g/dL (ref 3.6–5.1)
BILIRUBIN DIRECT: 0.1 mg/dL (ref <=0.2)
BILIRUBIN INDIRECT: 0.6 mg/dL (ref 0.2–1.2)
Total Bilirubin: 0.7 mg/dL (ref 0.2–1.2)
Total Protein: 6.8 g/dL (ref 6.1–8.1)

## 2015-09-15 LAB — LIPID PANEL
CHOL/HDL RATIO: 4.8 ratio (ref <=5.0)
Cholesterol: 203 mg/dL — ABNORMAL HIGH (ref 125–200)
HDL: 42 mg/dL — AB (ref >=46)
LDL CALC: 113 mg/dL (ref <130)
Triglycerides: 241 mg/dL — ABNORMAL HIGH (ref <150)
VLDL: 48 mg/dL — ABNORMAL HIGH (ref <30)

## 2015-09-15 NOTE — Patient Instructions (Addendum)
Medication Instructions:  Your physician recommends that you continue on your current medications as directed. Please refer to the Current Medication list given to you today.  Labwork: NONE  Testing/Procedures: Your physician has requested that you have an echocardiogram in February. Echocardiography is a painless test that uses sound waves to create images of your heart. It provides your doctor with information about the size and shape of your heart and how well your heart's chambers and valves are working. This procedure takes approximately one hour. There are no restrictions for this procedure.  Follow-Up: Your physician wants you to follow-up in: 6 months with Dr. Johnsie Cancel. You will receive a reminder letter in the mail two months in advance. If you don't receive a letter, please call our office to schedule the follow-up appointment.   If you need a refill on your cardiac medications before your next appointment, please call your pharmacy.

## 2015-11-02 ENCOUNTER — Ambulatory Visit (HOSPITAL_COMMUNITY): Payer: Medicare Other | Attending: Cardiology

## 2015-11-02 ENCOUNTER — Other Ambulatory Visit: Payer: Self-pay

## 2015-11-02 DIAGNOSIS — I350 Nonrheumatic aortic (valve) stenosis: Secondary | ICD-10-CM | POA: Insufficient documentation

## 2015-11-02 DIAGNOSIS — E785 Hyperlipidemia, unspecified: Secondary | ICD-10-CM | POA: Diagnosis not present

## 2015-11-02 DIAGNOSIS — I352 Nonrheumatic aortic (valve) stenosis with insufficiency: Secondary | ICD-10-CM | POA: Diagnosis not present

## 2015-11-02 DIAGNOSIS — E669 Obesity, unspecified: Secondary | ICD-10-CM | POA: Diagnosis not present

## 2015-11-02 DIAGNOSIS — I071 Rheumatic tricuspid insufficiency: Secondary | ICD-10-CM | POA: Insufficient documentation

## 2015-11-02 DIAGNOSIS — Z6841 Body Mass Index (BMI) 40.0 and over, adult: Secondary | ICD-10-CM | POA: Diagnosis not present

## 2015-12-29 ENCOUNTER — Telehealth: Payer: Self-pay | Admitting: Cardiovascular Disease

## 2015-12-29 NOTE — Telephone Encounter (Signed)
New Message  Pt calling to speak w/ Rn concerning knee replacement surgery she has upcoming- pt was made aware of office policy- Provider performing surgery is to contact our office directly for clearance if they require. Pt did not believe that- and requested to speak w/ rN. Please call back and discuss.

## 2015-12-29 NOTE — Telephone Encounter (Signed)
Called patient back. Patient stated she is schedule for surgery in June. Patient did not know if she needs to come for an appointment before surgery. Informed patient that the surgeon's office will contact our office for a clearance. At that time Dr. Johnsie Cancel will determine if she needs an office visit or any testing for any cardiac clearance. Patient verbalized understanding.

## 2016-02-09 ENCOUNTER — Other Ambulatory Visit: Payer: Self-pay | Admitting: Surgical

## 2016-02-14 ENCOUNTER — Encounter (HOSPITAL_COMMUNITY)
Admission: RE | Admit: 2016-02-14 | Discharge: 2016-02-14 | Disposition: A | Discharge status: Home / Self Care | Payer: Medicare Other | Source: Ambulatory Visit | Attending: Orthopedic Surgery | Admitting: Orthopedic Surgery

## 2016-02-14 ENCOUNTER — Encounter (HOSPITAL_COMMUNITY): Payer: Self-pay

## 2016-02-14 ENCOUNTER — Ambulatory Visit (HOSPITAL_COMMUNITY)
Admission: RE | Admit: 2016-02-14 | Discharge: 2016-02-14 | Disposition: A | Discharge status: Home / Self Care | Payer: Medicare Other | Source: Ambulatory Visit | Attending: Surgical | Admitting: Surgical

## 2016-02-14 DIAGNOSIS — Z01818 Encounter for other preprocedural examination: Secondary | ICD-10-CM | POA: Diagnosis not present

## 2016-02-14 LAB — BASIC METABOLIC PANEL
ANION GAP: 7 (ref 5–15)
BUN: 15 mg/dL (ref 6–20)
CALCIUM: 9 mg/dL (ref 8.9–10.3)
CHLORIDE: 105 mmol/L (ref 101–111)
CO2: 27 mmol/L (ref 22–32)
Creatinine, Ser: 0.55 mg/dL (ref 0.44–1.00)
GFR calc Af Amer: >60 mL/min (ref >60)
GFR calc non Af Amer: >60 mL/min (ref >60)
GLUCOSE: 98 mg/dL (ref 65–99)
Potassium: 4.5 mmol/L (ref 3.5–5.1)
Sodium: 139 mmol/L (ref 135–145)

## 2016-02-14 LAB — CBC WITH DIFFERENTIAL/PLATELET
Basophils Absolute: 0 10*3/uL (ref 0.0–0.1)
Basophils Relative: 0 %
Eosinophils Absolute: 0.2 10*3/uL (ref 0.0–0.7)
Eosinophils Relative: 2 %
HCT: 41.5 % (ref 36.0–46.0)
Hemoglobin: 13.4 g/dL (ref 12.0–15.0)
Lymphocytes Relative: 30 %
Lymphs Abs: 2.3 10*3/uL (ref 0.7–4.0)
MCH: 29.1 pg (ref 26.0–34.0)
MCHC: 32.3 g/dL (ref 30.0–36.0)
MCV: 90 fL (ref 78.0–100.0)
Monocytes Absolute: 0.3 10*3/uL (ref 0.1–1.0)
Monocytes Relative: 5 %
Neutro Abs: 4.8 10*3/uL (ref 1.7–7.7)
Neutrophils Relative %: 63 %
Platelets: 228 10*3/uL (ref 150–400)
RBC: 4.61 MIL/uL (ref 3.87–5.11)
RDW: 13.9 % (ref 11.5–15.5)
WBC: 7.6 10*3/uL (ref 4.0–10.5)

## 2016-02-14 LAB — URINE MICROSCOPIC-ADD ON

## 2016-02-14 LAB — PROTIME-INR
INR: 1.09 (ref 0.00–1.49)
Prothrombin Time: 13.9 seconds (ref 11.6–15.2)

## 2016-02-14 LAB — URINALYSIS, ROUTINE W REFLEX MICROSCOPIC
Bilirubin Urine: NEGATIVE
Glucose, UA: NEGATIVE mg/dL
Ketones, ur: NEGATIVE mg/dL
Nitrite: NEGATIVE
Protein, ur: NEGATIVE mg/dL
Specific Gravity, Urine: 1.011 (ref 1.005–1.030)
pH: 5 (ref 5.0–8.0)

## 2016-02-14 LAB — SURGICAL PCR SCREEN
MRSA, PCR: NEGATIVE
Staphylococcus aureus: POSITIVE — AB

## 2016-02-14 LAB — APTT: aPTT: 30 seconds (ref 24–37)

## 2016-02-14 NOTE — Patient Instructions (Signed)
Debra Knight  02/14/2016   Your procedure is scheduled on: 02/20/2016    Report to [facility] Main  Entrance take [place]  elevators to 3rd floor to  [place] at   [place] AM.  Call this number if you have problems the morning of surgery [phone]   Remember: ONLY 1 PERSON MAY GO WITH YOU TO SHORT STAY TO GET  READY MORNING OF YOUR SURGERY.  Do not eat food or drink liquids :After Midnight.     Take these medicines the morning of surgery with A SIP OF WATER: Zantac                                 You may not have any metal on your body including hair pins and              piercings  Do not wear jewelry, make-up, lotions, powders or perfumes, deodorant             Do not wear nail polish.  Do not shave  48 hours prior to surgery.             .   Do not bring valuables to the hospital. [place].  Contacts, dentures or bridgework may not be worn into surgery.  Leave suitcase in the car. After surgery it may be brought to your room.        Special Instructions: coughing and deep breathing exercises, leg exercises               Please read over the following fact sheets you were given: _____________________________________________________________________             [facility] - Preparing for Surgery Before surgery, you can play an important role.  Because skin is not sterile, your skin needs to be as free of germs as possible.  You can reduce the number of germs on your skin by washing with CHG (chlorahexidine gluconate) soap before surgery.  CHG is an antiseptic cleaner which kills germs and bonds with the skin to continue killing germs even after washing. Please DO NOT use if you have an allergy to CHG or antibacterial soaps.  If your skin becomes reddened/irritated stop using the CHG and inform your nurse when you arrive at Short Stay. Do not shave (including legs and underarms) for at least 48  hours prior to the first CHG shower.  You may shave your face/neck. Please follow these instructions carefully:  1.  Shower with CHG Soap the night before surgery and the  morning of Surgery.  2.  If you choose to wash your hair, wash your hair first as usual with your  normal  shampoo.  3.  After you shampoo, rinse your hair and body thoroughly to remove the  shampoo.                           4.  Use CHG as you would any other liquid soap.  You can apply chg directly  to the skin and wash                       Gently with  a scrungie or clean washcloth.  5.  Apply the CHG Soap to your body ONLY FROM THE NECK DOWN.   Do not use on face/ open                           Wound or open sores. Avoid contact with eyes, ears mouth and genitals (private parts).                       Wash face,  Genitals (private parts) with your normal soap.             6.  Wash thoroughly, paying special attention to the area where your surgery  will be performed.  7.  Thoroughly rinse your body with warm water from the neck down.  8.  DO NOT shower/wash with your normal soap after using and rinsing off  the CHG Soap.                9.  Pat yourself dry with a clean towel.            10.  Wear clean pajamas.            11.  Place clean sheets on your bed the night of your first shower and do not  sleep with pets. Day of Surgery : Do not apply any lotions/deodorants the morning of surgery.  Please wear clean clothes to the hospital/surgery center.  FAILURE TO FOLLOW THESE INSTRUCTIONS MAY RESULT IN THE CANCELLATION OF YOUR SURGERY PATIENT SIGNATURE_________________________________  NURSE SIGNATURE__________________________________  ________________________________________________________________________ [facility] - Preparing for Surgery Before surgery, you can play an important role.  Because skin is not sterile, your skin needs to be as free of germs as possible.  You can reduce the number of germs on your skin by  washing with CHG (chlorahexidine gluconate) soap before surgery.  CHG is an antiseptic cleaner which kills germs and bonds with the skin to continue killing germs even after washing. Please DO NOT use if you have an allergy to CHG or antibacterial soaps.  If your skin becomes reddened/irritated stop using the CHG and inform your nurse when you arrive at Short Stay. Do not shave (including legs and underarms) for at least 48 hours prior to the first CHG shower.  You may shave your face/neck. Please follow these instructions carefully:  1.  Shower with CHG Soap the night before surgery and the  morning of Surgery.  2.  If you choose to wash your hair, wash your hair first as usual with your  normal  shampoo.  3.  After you shampoo, rinse your hair and body thoroughly to remove the  shampoo.                           4.  Use CHG as you would any other liquid soap.  You can apply chg directly  to the skin and wash                       Gently with a scrungie or clean washcloth.  5.  Apply the CHG Soap to your body ONLY FROM THE NECK DOWN.   Do not use on face/ open                           Wound or open sores.  Avoid contact with eyes, ears mouth and genitals (private parts).                       Wash face,  Genitals (private parts) with your normal soap.             6.  Wash thoroughly, paying special attention to the area where your surgery  will be performed.  7.  Thoroughly rinse your body with warm water from the neck down.  8.  DO NOT shower/wash with your normal soap after using and rinsing off  the CHG Soap.                9.  Pat yourself dry with a clean towel.            10.  Wear clean pajamas.            11.  Place clean sheets on your bed the night of your first shower and do not  sleep with pets. Day of Surgery : Do not apply any lotions/deodorants the morning of surgery.  Please wear clean clothes to the hospital/surgery center.  FAILURE TO FOLLOW THESE INSTRUCTIONS MAY RESULT IN THE  CANCELLATION OF YOUR SURGERY PATIENT SIGNATURE_________________________________  NURSE SIGNATURE__________________________________  ________________________________________________________________________  WHAT IS A BLOOD TRANSFUSION? Blood Transfusion Information  A transfusion is the replacement of blood or some of its parts. Blood is made up of multiple cells which provide different functions.  Red blood cells carry oxygen and are used for blood loss replacement.  White blood cells fight against infection.  Platelets control bleeding.  Plasma helps clot blood.  Other blood products are available for specialized needs, such as hemophilia or other clotting disorders. BEFORE THE TRANSFUSION  Who gives blood for transfusions?   Healthy volunteers who are fully evaluated to make sure their blood is safe. This is blood bank blood. Transfusion therapy is the safest it has ever been in the practice of medicine. Before blood is taken from a donor, a complete history is taken to make sure that person has no history of diseases nor engages in risky social behavior (examples are intravenous drug use or sexual activity with multiple partners). The donor's travel history is screened to minimize risk of transmitting infections, such as malaria. The donated blood is tested for signs of infectious diseases, such as HIV and hepatitis. The blood is then tested to be sure it is compatible with you in order to minimize the chance of a transfusion reaction. If you or a relative donates blood, this is often done in anticipation of surgery and is not appropriate for emergency situations. It takes many days to process the donated blood. RISKS AND COMPLICATIONS Although transfusion therapy is very safe and saves many lives, the main dangers of transfusion include:  1. Getting an infectious disease. 2. Developing a transfusion reaction. This is an allergic reaction to something in the blood you were given. Every  precaution is taken to prevent this. The decision to have a blood transfusion has been considered carefully by your caregiver before blood is given. Blood is not given unless the benefits outweigh the risks. AFTER THE TRANSFUSION  Right after receiving a blood transfusion, you will usually feel much better and more energetic. This is especially true if your red blood cells have gotten low (anemic). The transfusion raises the level of the red blood cells which carry oxygen, and this usually causes an energy increase.  The nurse administering the  transfusion will monitor you carefully for complications. HOME CARE INSTRUCTIONS  No special instructions are needed after a transfusion. You may find your energy is better. Speak with your caregiver about any limitations on activity for underlying diseases you may have. SEEK MEDICAL CARE IF:   Your condition is not improving after your transfusion.  You develop redness or irritation at the intravenous (IV) site. SEEK IMMEDIATE MEDICAL CARE IF:  Any of the following symptoms occur over the next 12 hours:  Shaking chills.  You have a temperature by mouth above 102 F (38.9 C), not controlled by medicine.  Chest, back, or muscle pain.  People around you feel you are not acting correctly or are confused.  Shortness of breath or difficulty breathing.  Dizziness and fainting.  You get a rash or develop hives.  You have a decrease in urine output.  Your urine turns a dark color or changes to pink, red, or brown. Any of the following symptoms occur over the next 10 days:  You have a temperature by mouth above 102 F (38.9 C), not controlled by medicine.  Shortness of breath.  Weakness after normal activity.  The white part of the eye turns yellow (jaundice).  You have a decrease in the amount of urine or are urinating less often.  Your urine turns a dark color or changes to pink, red, or brown. Document Released: 08/17/2000 Document  Revised: 11/12/2011 Document Reviewed: 04/05/2008 ExitCare Patient Information 2014 [place].  _______________________________________________________________________  Incentive Spirometer  An incentive spirometer is a tool that can help keep your lungs clear and active. This tool measures how well you are filling your lungs with each breath. Taking long deep breaths may help reverse or decrease the chance of developing breathing (pulmonary) problems (especially infection) following:  A long period of time when you are unable to move or be active. BEFORE THE PROCEDURE   If the spirometer includes an indicator to show your best effort, your nurse or respiratory therapist will set it to a desired goal.  If possible, sit up straight or lean slightly forward. Try not to slouch.  Hold the incentive spirometer in an upright position. INSTRUCTIONS FOR USE  3. Sit on the edge of your bed if possible, or sit up as far as you can in bed or on a chair. 4. Hold the incentive spirometer in an upright position. 5. Breathe out normally. 6. Place the mouthpiece in your mouth and seal your lips tightly around it. 7. Breathe in slowly and as deeply as possible, raising the piston or the ball toward the top of the column. 8. Hold your breath for 3-5 seconds or for as long as possible. Allow the piston or ball to fall to the bottom of the column. 9. Remove the mouthpiece from your mouth and breathe out normally. 10. Rest for a few seconds and repeat Steps 1 through 7 at least 10 times every 1-2 hours when you are awake. Take your time and take a few normal breaths between deep breaths. 11. The spirometer may include an indicator to show your best effort. Use the indicator as a goal to work toward during each repetition. 12. After each set of 10 deep breaths, practice coughing to be sure your lungs are clear. If you have an incision (the cut made at the time of surgery), support your incision when  coughing by placing a pillow or rolled up towels firmly against it. Once you are able to get out of bed,  walk around indoors and cough well. You may stop using the incentive spirometer when instructed by your caregiver.  RISKS AND COMPLICATIONS  Take your time so you do not get dizzy or light-headed.  If you are in pain, you may need to take or ask for pain medication before doing incentive spirometry. It is harder to take a deep breath if you are having pain. AFTER USE  Rest and breathe slowly and easily.  It can be helpful to keep track of a log of your progress. Your caregiver can provide you with a simple table to help with this. If you are using the spirometer at home, follow these instructions: [place] IF:   You are having difficultly using the spirometer.  You have trouble using the spirometer as often as instructed.  Your pain medication is not giving enough relief while using the spirometer.  You develop fever of 100.5 F (38.1 C) or higher. SEEK IMMEDIATE MEDICAL CARE IF:   You cough up bloody sputum that had not been present before.  You develop fever of 102 F (38.9 C) or greater.  You develop worsening pain at or near the incision site. MAKE SURE YOU:   Understand these instructions.  Will watch your condition.  Will get help right away if you are not doing well or get worse. Document Released: 12/31/2006 Document Revised: 11/12/2011 Document Reviewed: 03/03/2007 [facility] Patient Information 2014 [place], [place].   ________________________________________________________________________

## 2016-02-14 NOTE — Progress Notes (Signed)
U/A and micro done 02/14/16 routed via EPIC to DR Aluisio.

## 2016-02-14 NOTE — Progress Notes (Signed)
EKG-09/05/15- EPIC  11/02/15- [place]  11/2014- Stress- EPIC  09/15/15- LOV- cardiology- EPIC

## 2016-02-15 ENCOUNTER — Encounter (HOSPITAL_COMMUNITY): Payer: Self-pay

## 2016-02-19 ENCOUNTER — Ambulatory Visit: Payer: Self-pay | Admitting: Orthopedic Surgery

## 2016-02-19 MED ORDER — VANCOMYCIN HCL 10 G IV SOLR
1500.0000 mg | INTRAVENOUS | Status: AC
  Administered 2016-02-20: 1500 mg via INTRAVENOUS
  Filled 2016-02-19: qty 1500

## 2016-02-19 NOTE — H&P (Signed)
Debra Knight DOB: [birth year] Married / Language: Debra Knight / Race: White Female Date of Admission:  02/20/2016 CC:  Left Knee Pain History of Present Illness The patient is a 70 year old female who comes in for a preoperative History and Physical. The patient is scheduled for a left total knee arthroplasty to be performed by Dr. Dione Plover. Aluisio, MD at [facility] on 02/20/2016. The patient is a 70 year old female who presented for follow up of their knee. The patient has been followed for their bilateral knee pain and osteoarthritis. She recently underwent a Right TKA. Symptoms reported on the left include: pain. The patient feels that they are doing poorly. The following medication has been used for pain control: antiinflammatory medication (Aleve twice daily). Patient states that she received only inimal relief with injections in the past. Unfortunately the injections have not provided any long lasting benefit. She has pain at all times. All of this is limiting her activity. The most predictable means of improving pain and function long term would be knee replacement. She has been recovering well with regards to the right knee. They have been treated conservatively in the past for the above stated problem and despite conservative measures, they continue to have progressive pain and severe functional limitations and dysfunction. They have failed non-operative management including home exercise, medications, and injections. It is felt that they would benefit from undergoing total joint replacement. Risks and benefits of the procedure have been discussed with the patient and they elect to proceed with surgery. There are no active contraindications to surgery such as ongoing infection or rapidly progressive neurological disease.  Problem List/Past Medical  Primary osteoarthritis of both knees (M17.0)  Status post total right knee replacement (Z96.651)  Heart murmur  Osteoarthritis  Sleep  Apnea  uses CPAP Hypercholesterolemia  Vertigo  Tinnitus  Hypercholesterolemia  Gastroesophageal Reflux Disease  Menopause  Aortic Stenosis  Urinary tract infection, acute (N39.0)  Osteoarthritis of right knee (M17.11)  Measles  Rubella  Allergies  Penicillins  Hives. Advil *ANALGESICS - ANTI-INFLAMMATORY*  tongue swells Demerol *ANALGESICS - OPIOID*  Vomiting. TraMADol HCl *ANALGESICS - OPIOID*  GI Intolerance - Stomach Pain Robaxin *MUSCULOSKELETAL THERAPY AGENTS*  Difficulty swallowing. DUCK   Social History Tobacco use  Former smoker. 03/11/2014: smoke(d) 1 pack(s) per day Tobacco / smoke exposure  03/11/2014: no Children  2 Current drinker  03/11/2014: Currently drinks wine only occasionally per week Exercise  Exercises never No history of drug/alcohol rehab  Marital status  married Current work status  retired Living situation  live with spouse Plan - Home following the Left TKA on 02/20/2016  Medication History Glucosamine Chondroitin Complx (Oral) Active. Biotin (Oral) Specific strength unknown - Active. Aspirin (81MG  Tablet, Oral) Active. Aleve PM (220-25MG  Tablet, Oral) Active. Atorvastatin Calcium (40MG  Tablet, Oral) Active. TraMADol HCl (50MG  Tablet, Oral) Active.  Past Surgical History Dilation and Curettage of Uterus  Tonsillectomy  Total Knee Replacement - Right   Review of Systems  General Not Present- Chills, Fatigue, Fever, Memory Loss, Night Sweats, Weight Gain and Weight Loss. Skin Not Present- Eczema, Hives, Itching, Lesions and Rash. HEENT Present- Tinnitus. Not Present- Dentures, Double Vision, Headache, Hearing Loss and Visual Loss. Respiratory Not Present- Allergies, Chronic Cough, Coughing up blood, Shortness of breath at rest and Shortness of breath with exertion. Cardiovascular Not Present- Chest Pain, Difficulty Breathing Lying Down, Murmur, Palpitations, Racing/skipping heartbeats and  Swelling. Gastrointestinal Not Present- Abdominal Pain, Bloody Stool, Constipation, Diarrhea, Difficulty Swallowing, Heartburn, Jaundice,  Loss of appetitie, Nausea and Vomiting. Female Genitourinary Not Present- Blood in Urine, Discharge, Flank Pain, Incontinence, Painful Urination, Urgency, Urinary frequency, Urinary Retention, Urinating at Night and Weak urinary stream. Musculoskeletal Present- Joint Pain. Not Present- Back Pain, Joint Swelling, Morning Stiffness, Muscle Pain, Muscle Weakness and Spasms. Neurological Not Present- Blackout spells, Difficulty with balance, Dizziness, Paralysis, Tremor and Weakness. Psychiatric Not Present- Insomnia.  Vitals Weight: 222 lb Height: 60.5in Body Surface Area: 1.96 m Body Mass Index: 42.64 kg/m  BP: 158/84 (Sitting, Right Arm, Standard)  Physical Exam General Mental Status -Alert, cooperative and good historian. General Appearance-pleasant, Not in acute distress. Orientation-Oriented X3. Build & Nutrition-Well nourished and Well developed.  Head and Neck Head-normocephalic, atraumatic . Neck Global Assessment - supple, no bruit auscultated on the right, no bruit auscultated on the left.  Eye Pupil - Bilateral-Regular and Round. Motion - Bilateral-EOMI.  Chest and Lung Exam Auscultation Breath sounds - clear at anterior chest wall and clear at posterior chest wall. Adventitious sounds - No Adventitious sounds.  Cardiovascular Auscultation Rhythm - Regular rate and rhythm. Heart Sounds - S1 WNL and S2 WNL. Murmurs & Other Heart Sounds - Auscultation of the heart reveals - No Murmurs.  Abdomen Palpation/Percussion Tenderness - Abdomen is non-tender to palpation. Rigidity (guarding) - Abdomen is soft. Auscultation Auscultation of the abdomen reveals - Bowel sounds normal.  Female Genitourinary Note: Not done, not pertinent to present illness   Musculoskeletal Note: The knee show no effusion. There is  marked crepitus on range of motion of left knee with varus deformiy. Range is about 5-125. Slight tenderness medial greater than lateral with no instability noted.  RADIOGRAPHS: She has severe bone on bone arthritis, medial and patellofemoral. Left knee is bone on bone, varus deformity.   Assessment & Plan Status post total right knee replacement [id number]) Primary osteoarthritis of left knee (M17.12)  Note:Surgical Plans: Left Total Knee Replacement  Disposition: Home  Cards: Dr. Jenkins Rouge  IV TXA  Anesthesia Issues: None  Signed electronically by Ok Edwards, III PA-C

## 2016-02-20 ENCOUNTER — Encounter (HOSPITAL_COMMUNITY)
Admission: RE | Disposition: A | Discharge status: Home Health | Payer: Self-pay | Source: Ambulatory Visit | Attending: Orthopedic Surgery

## 2016-02-20 ENCOUNTER — Inpatient Hospital Stay (HOSPITAL_COMMUNITY)
Admission: RE | Admit: 2016-02-20 | Discharge: 2016-02-22 | DRG: 470 | Disposition: A | Discharge status: Home Health | Payer: Medicare Other | Source: Ambulatory Visit | Attending: Orthopedic Surgery | Admitting: Orthopedic Surgery

## 2016-02-20 ENCOUNTER — Encounter (HOSPITAL_COMMUNITY): Payer: Self-pay | Admitting: Anesthesiology

## 2016-02-20 ENCOUNTER — Inpatient Hospital Stay (HOSPITAL_COMMUNITY): Payer: Medicare Other | Admitting: Anesthesiology

## 2016-02-20 DIAGNOSIS — E785 Hyperlipidemia, unspecified: Secondary | ICD-10-CM | POA: Diagnosis present

## 2016-02-20 DIAGNOSIS — G8918 Other acute postprocedural pain: Secondary | ICD-10-CM | POA: Diagnosis not present

## 2016-02-20 DIAGNOSIS — Z87891 Personal history of nicotine dependence: Secondary | ICD-10-CM

## 2016-02-20 DIAGNOSIS — M1710 Unilateral primary osteoarthritis, unspecified knee: Secondary | ICD-10-CM | POA: Diagnosis present

## 2016-02-20 DIAGNOSIS — Z6841 Body Mass Index (BMI) 40.0 and over, adult: Secondary | ICD-10-CM

## 2016-02-20 DIAGNOSIS — Z880 Allergy status to penicillin: Secondary | ICD-10-CM

## 2016-02-20 DIAGNOSIS — Z780 Asymptomatic menopausal state: Secondary | ICD-10-CM | POA: Diagnosis not present

## 2016-02-20 DIAGNOSIS — M1712 Unilateral primary osteoarthritis, left knee: Secondary | ICD-10-CM | POA: Diagnosis not present

## 2016-02-20 DIAGNOSIS — Z888 Allergy status to other drugs, medicaments and biological substances status: Secondary | ICD-10-CM | POA: Diagnosis not present

## 2016-02-20 DIAGNOSIS — K219 Gastro-esophageal reflux disease without esophagitis: Secondary | ICD-10-CM | POA: Diagnosis not present

## 2016-02-20 DIAGNOSIS — I350 Nonrheumatic aortic (valve) stenosis: Secondary | ICD-10-CM | POA: Diagnosis not present

## 2016-02-20 DIAGNOSIS — M179 Osteoarthritis of knee, unspecified: Secondary | ICD-10-CM | POA: Diagnosis not present

## 2016-02-20 DIAGNOSIS — M25762 Osteophyte, left knee: Secondary | ICD-10-CM | POA: Diagnosis not present

## 2016-02-20 DIAGNOSIS — Z885 Allergy status to narcotic agent status: Secondary | ICD-10-CM | POA: Diagnosis not present

## 2016-02-20 DIAGNOSIS — Z96651 Presence of right artificial knee joint: Secondary | ICD-10-CM | POA: Diagnosis present

## 2016-02-20 DIAGNOSIS — G4733 Obstructive sleep apnea (adult) (pediatric): Secondary | ICD-10-CM | POA: Diagnosis not present

## 2016-02-20 DIAGNOSIS — M25562 Pain in left knee: Secondary | ICD-10-CM | POA: Diagnosis not present

## 2016-02-20 DIAGNOSIS — I10 Essential (primary) hypertension: Secondary | ICD-10-CM | POA: Diagnosis not present

## 2016-02-20 HISTORY — PX: TOTAL KNEE ARTHROPLASTY: SHX125

## 2016-02-20 LAB — TYPE AND SCREEN
ABO/RH(D): A POS
Antibody Screen: NEGATIVE

## 2016-02-20 SURGERY — ARTHROPLASTY, KNEE, TOTAL
Anesthesia: General | Site: Knee | Laterality: Left

## 2016-02-20 MED ORDER — CHLORHEXIDINE GLUCONATE 4 % EX LIQD
60.0000 mL | Freq: Once | CUTANEOUS | Status: DC
Start: 2016-02-20 — End: 2016-02-20

## 2016-02-20 MED ORDER — METHOCARBAMOL 500 MG PO TABS: 500.0000 mg | ORAL_TABLET | Freq: Four times a day (QID) | ORAL | Status: DC | PRN

## 2016-02-20 MED ORDER — SODIUM CHLORIDE 0.9 % IV SOLN
INTRAVENOUS | Status: DC
  Administered 2016-02-20: 11:00:00 via INTRAVENOUS

## 2016-02-20 MED ORDER — SODIUM CHLORIDE 0.9 % IJ SOLN
INTRAMUSCULAR | Status: DC | PRN
  Administered 2016-02-20: 30 mL

## 2016-02-20 MED ORDER — TRAMADOL HCL 50 MG PO TABS: 50.0000 mg | ORAL_TABLET | Freq: Four times a day (QID) | ORAL | Status: DC | PRN

## 2016-02-20 MED ORDER — METOCLOPRAMIDE HCL 5 MG/ML IJ SOLN: 5.0000 mg | Freq: Three times a day (TID) | INTRAMUSCULAR | Status: DC | PRN

## 2016-02-20 MED ORDER — ONDANSETRON HCL 4 MG/2ML IJ SOLN: 4.0000 mg | Freq: Four times a day (QID) | INTRAMUSCULAR | Status: DC | PRN

## 2016-02-20 MED ORDER — ONDANSETRON HCL 4 MG/2ML IJ SOLN
INTRAMUSCULAR | Status: AC
  Filled 2016-02-20: qty 2

## 2016-02-20 MED ORDER — MENTHOL 3 MG MT LOZG: 1.0000 | LOZENGE | OROMUCOSAL | Status: DC | PRN

## 2016-02-20 MED ORDER — TRAMADOL HCL 50 MG PO TABS
50.0000 mg | ORAL_TABLET | Freq: Four times a day (QID) | ORAL | Status: DC | PRN
  Filled 2016-02-20: qty 1

## 2016-02-20 MED ORDER — ACETAMINOPHEN 10 MG/ML IV SOLN
INTRAVENOUS | Status: DC | PRN
  Administered 2016-02-20: 1000 mg via INTRAVENOUS

## 2016-02-20 MED ORDER — MIDAZOLAM HCL 2 MG/2ML IJ SOLN
1.0000 mg | Freq: Once | INTRAMUSCULAR | Status: AC
  Administered 2016-02-20: 1 mg via INTRAVENOUS

## 2016-02-20 MED ORDER — DEXAMETHASONE SODIUM PHOSPHATE 10 MG/ML IJ SOLN
INTRAMUSCULAR | Status: DC | PRN
  Administered 2016-02-20: 10 mg via INTRAVENOUS

## 2016-02-20 MED ORDER — DOCUSATE SODIUM 100 MG PO CAPS
100.0000 mg | ORAL_CAPSULE | Freq: Two times a day (BID) | ORAL | Status: DC
  Administered 2016-02-20 – 2016-02-22 (×4): 100 mg via ORAL
  Filled 2016-02-20 (×4): qty 1

## 2016-02-20 MED ORDER — DEXAMETHASONE SODIUM PHOSPHATE 10 MG/ML IJ SOLN
INTRAMUSCULAR | Status: AC
  Filled 2016-02-20: qty 1

## 2016-02-20 MED ORDER — BUPIVACAINE LIPOSOME 1.3 % IJ SUSP
20.0000 mL | Freq: Once | INTRAMUSCULAR | Status: DC
  Filled 2016-02-20: qty 20

## 2016-02-20 MED ORDER — TRANEXAMIC ACID 1000 MG/10ML IV SOLN
1000.0000 mg | Freq: Once | INTRAVENOUS | Status: AC
  Administered 2016-02-20: 1000 mg via INTRAVENOUS
  Filled 2016-02-20: qty 10

## 2016-02-20 MED ORDER — PHENYLEPHRINE HCL 10 MG/ML IJ SOLN
10.0000 mg | INTRAVENOUS | Status: DC | PRN
  Administered 2016-02-20: 40 ug/min via INTRAVENOUS

## 2016-02-20 MED ORDER — LIDOCAINE HCL (CARDIAC) 20 MG/ML IV SOLN
INTRAVENOUS | Status: DC | PRN
  Administered 2016-02-20: 50 mg via INTRAVENOUS

## 2016-02-20 MED ORDER — BISACODYL 10 MG RE SUPP: 10.0000 mg | Freq: Every day | RECTAL | Status: DC | PRN

## 2016-02-20 MED ORDER — BUPIVACAINE LIPOSOME 1.3 % IJ SUSP: 20.0000 mL | Freq: Once | INTRAMUSCULAR | Status: DC

## 2016-02-20 MED ORDER — DEXAMETHASONE SODIUM PHOSPHATE 10 MG/ML IJ SOLN
10.0000 mg | Freq: Once | INTRAMUSCULAR | Status: AC
  Administered 2016-02-21: 10 mg via INTRAVENOUS
  Filled 2016-02-20: qty 1

## 2016-02-20 MED ORDER — LACTATED RINGERS IV SOLN
INTRAVENOUS | Status: DC | PRN
  Administered 2016-02-20 (×2): via INTRAVENOUS

## 2016-02-20 MED ORDER — OXYCODONE HCL 5 MG PO TABS: 5.0000 mg | ORAL_TABLET | ORAL | Status: DC | PRN

## 2016-02-20 MED ORDER — METHOCARBAMOL 1000 MG/10ML IJ SOLN
500.0000 mg | Freq: Four times a day (QID) | INTRAVENOUS | Status: DC | PRN
  Administered 2016-02-20: 500 mg via INTRAVENOUS
  Filled 2016-02-20: qty 5
  Filled 2016-02-20: qty 550

## 2016-02-20 MED ORDER — VANCOMYCIN HCL IN DEXTROSE 1-5 GM/200ML-% IV SOLN
1000.0000 mg | Freq: Two times a day (BID) | INTRAVENOUS | Status: AC
  Administered 2016-02-21: 1000 mg via INTRAVENOUS
  Filled 2016-02-20: qty 200

## 2016-02-20 MED ORDER — FENTANYL CITRATE (PF) 100 MCG/2ML IJ SOLN
INTRAMUSCULAR | Status: AC
  Filled 2016-02-20: qty 2

## 2016-02-20 MED ORDER — ONDANSETRON HCL 4 MG/2ML IJ SOLN
INTRAMUSCULAR | Status: DC | PRN
  Administered 2016-02-20: 4 mg via INTRAVENOUS

## 2016-02-20 MED ORDER — ACETAMINOPHEN 10 MG/ML IV SOLN
INTRAVENOUS | Status: AC
  Filled 2016-02-20: qty 100

## 2016-02-20 MED ORDER — ACETAMINOPHEN 325 MG PO TABS: 650.0000 mg | ORAL_TABLET | Freq: Four times a day (QID) | ORAL | Status: DC | PRN

## 2016-02-20 MED ORDER — FENTANYL CITRATE (PF) 100 MCG/2ML IJ SOLN
INTRAMUSCULAR | Status: DC | PRN
  Administered 2016-02-20: 50 ug via INTRAVENOUS
  Administered 2016-02-20: 100 ug via INTRAVENOUS
  Administered 2016-02-20 (×2): 50 ug via INTRAVENOUS

## 2016-02-20 MED ORDER — SODIUM CHLORIDE 0.9 % IV SOLN
INTRAVENOUS | Status: DC
  Administered 2016-02-20 – 2016-02-21 (×2): via INTRAVENOUS

## 2016-02-20 MED ORDER — MORPHINE SULFATE (PF) 2 MG/ML IV SOLN
1.0000 mg | INTRAVENOUS | Status: DC | PRN
  Administered 2016-02-20: 1 mg via INTRAVENOUS
  Filled 2016-02-20: qty 1

## 2016-02-20 MED ORDER — ONDANSETRON HCL 4 MG PO TABS: 4.0000 mg | ORAL_TABLET | Freq: Four times a day (QID) | ORAL | Status: DC | PRN

## 2016-02-20 MED ORDER — FAMOTIDINE 20 MG PO TABS
20.0000 mg | ORAL_TABLET | Freq: Two times a day (BID) | ORAL | Status: DC
  Administered 2016-02-20 – 2016-02-22 (×4): 20 mg via ORAL
  Filled 2016-02-20 (×5): qty 1

## 2016-02-20 MED ORDER — HYDROMORPHONE HCL 2 MG/ML IJ SOLN
INTRAMUSCULAR | Status: AC
  Filled 2016-02-20: qty 1

## 2016-02-20 MED ORDER — RIVAROXABAN 10 MG PO TABS: 10.0000 mg | ORAL_TABLET | Freq: Every day | ORAL | Status: DC

## 2016-02-20 MED ORDER — MIDAZOLAM HCL 2 MG/2ML IJ SOLN
INTRAMUSCULAR | Status: AC
  Filled 2016-02-20: qty 2

## 2016-02-20 MED ORDER — PHENOL 1.4 % MT LIQD: 1.0000 | OROMUCOSAL | Status: DC | PRN

## 2016-02-20 MED ORDER — ACETAMINOPHEN 500 MG PO TABS
1000.0000 mg | ORAL_TABLET | Freq: Four times a day (QID) | ORAL | Status: AC
  Administered 2016-02-20 – 2016-02-21 (×4): 1000 mg via ORAL
  Filled 2016-02-20 (×4): qty 2

## 2016-02-20 MED ORDER — ATORVASTATIN CALCIUM 40 MG PO TABS
40.0000 mg | ORAL_TABLET | Freq: Every day | ORAL | Status: DC
  Administered 2016-02-21 – 2016-02-22 (×2): 40 mg via ORAL
  Filled 2016-02-20 (×2): qty 1
  Filled 2016-02-20 (×2): qty 2

## 2016-02-20 MED ORDER — BUPIVACAINE LIPOSOME 1.3 % IJ SUSP
INTRAMUSCULAR | Status: DC | PRN
  Administered 2016-02-20: 20 mL

## 2016-02-20 MED ORDER — FLEET ENEMA 7-19 GM/118ML RE ENEM: 1.0000 | ENEMA | Freq: Once | RECTAL | Status: DC | PRN

## 2016-02-20 MED ORDER — SODIUM CHLORIDE 0.9 % IR SOLN
Status: DC | PRN
  Administered 2016-02-20: 1000 mL

## 2016-02-20 MED ORDER — SUCCINYLCHOLINE CHLORIDE 200 MG/10ML IV SOSY
PREFILLED_SYRINGE | INTRAVENOUS | Status: DC | PRN
  Administered 2016-02-20: 100 mg via INTRAVENOUS

## 2016-02-20 MED ORDER — ACETAMINOPHEN 650 MG RE SUPP: 650.0000 mg | Freq: Four times a day (QID) | RECTAL | Status: DC | PRN

## 2016-02-20 MED ORDER — FENTANYL CITRATE (PF) 250 MCG/5ML IJ SOLN
INTRAMUSCULAR | Status: AC
Start: 2016-02-20 — End: 2016-02-20
  Filled 2016-02-20: qty 5

## 2016-02-20 MED ORDER — METOCLOPRAMIDE HCL 5 MG PO TABS: 5.0000 mg | ORAL_TABLET | Freq: Three times a day (TID) | ORAL | Status: DC | PRN

## 2016-02-20 MED ORDER — SODIUM CHLORIDE 0.9 % IJ SOLN
INTRAMUSCULAR | Status: AC
  Filled 2016-02-20: qty 50

## 2016-02-20 MED ORDER — BUPIVACAINE-EPINEPHRINE (PF) 0.5% -1:200000 IJ SOLN
INTRAMUSCULAR | Status: AC
Start: 2016-02-20 — End: 2016-02-20
  Filled 2016-02-20: qty 30

## 2016-02-20 MED ORDER — HYDROMORPHONE HCL 1 MG/ML IJ SOLN
INTRAMUSCULAR | Status: DC | PRN
  Administered 2016-02-20 (×2): 1 mg via INTRAVENOUS

## 2016-02-20 MED ORDER — 0.9 % SODIUM CHLORIDE (POUR BTL) OPTIME
TOPICAL | Status: DC | PRN
  Administered 2016-02-20: 1000 mL

## 2016-02-20 MED ORDER — BUPIVACAINE HCL (PF) 0.25 % IJ SOLN
INTRAMUSCULAR | Status: AC
Start: 2016-02-20 — End: 2016-02-20
  Filled 2016-02-20: qty 30

## 2016-02-20 MED ORDER — RIVAROXABAN 10 MG PO TABS
10.0000 mg | ORAL_TABLET | Freq: Every day | ORAL | Status: DC
Start: 2016-02-21 — End: 2016-02-22
  Administered 2016-02-21 – 2016-02-22 (×2): 10 mg via ORAL
  Filled 2016-02-20 (×2): qty 1

## 2016-02-20 MED ORDER — OXYCODONE HCL 5 MG PO TABS
5.0000 mg | ORAL_TABLET | ORAL | Status: DC | PRN
  Administered 2016-02-20 – 2016-02-21 (×6): 5 mg via ORAL
  Administered 2016-02-21: 10 mg via ORAL
  Administered 2016-02-21 – 2016-02-22 (×3): 5 mg via ORAL
  Administered 2016-02-22: 10 mg via ORAL
  Administered 2016-02-22: 5 mg via ORAL
  Filled 2016-02-20 (×2): qty 1
  Filled 2016-02-20: qty 2
  Filled 2016-02-20 (×3): qty 1
  Filled 2016-02-20: qty 2
  Filled 2016-02-20: qty 1
  Filled 2016-02-20: qty 2
  Filled 2016-02-20 (×5): qty 1

## 2016-02-20 MED ORDER — FENTANYL CITRATE (PF) 100 MCG/2ML IJ SOLN
50.0000 ug | Freq: Once | INTRAMUSCULAR | Status: AC
  Administered 2016-02-20: 50 ug via INTRAVENOUS

## 2016-02-20 MED ORDER — PROPOFOL 10 MG/ML IV BOLUS
INTRAVENOUS | Status: DC | PRN
  Administered 2016-02-20: 80 mg via INTRAVENOUS

## 2016-02-20 MED ORDER — DIPHENHYDRAMINE HCL 12.5 MG/5ML PO ELIX
12.5000 mg | ORAL_SOLUTION | ORAL | Status: DC | PRN
Start: 2016-02-20 — End: 2016-02-22
  Filled 2016-02-20: qty 5

## 2016-02-20 MED ORDER — METHOCARBAMOL 500 MG PO TABS
500.0000 mg | ORAL_TABLET | Freq: Four times a day (QID) | ORAL | Status: DC | PRN
  Administered 2016-02-21 (×2): 500 mg via ORAL
  Filled 2016-02-20 (×4): qty 1

## 2016-02-20 MED ORDER — BUPIVACAINE-EPINEPHRINE (PF) 0.5% -1:200000 IJ SOLN
INTRAMUSCULAR | Status: DC | PRN
  Administered 2016-02-20: 20 mL via PERINEURAL

## 2016-02-20 MED ORDER — BUPIVACAINE HCL 0.25 % IJ SOLN
INTRAMUSCULAR | Status: DC | PRN
  Administered 2016-02-20: 30 mL

## 2016-02-20 MED ORDER — TRANEXAMIC ACID 1000 MG/10ML IV SOLN
1000.0000 mg | INTRAVENOUS | Status: AC
  Administered 2016-02-20: 1000 mg via INTRAVENOUS
  Filled 2016-02-20: qty 10

## 2016-02-20 MED ORDER — POLYETHYLENE GLYCOL 3350 17 G PO PACK: 17.0000 g | PACK | Freq: Every day | ORAL | Status: DC | PRN

## 2016-02-20 SURGICAL SUPPLY — 51 items
BAG DECANTER FOR FLEXI CONT (MISCELLANEOUS) ×2 IMPLANT
BAG ZIPLOCK 12X15 (MISCELLANEOUS) ×2 IMPLANT
BANDAGE ACE 6X5 VEL STRL LF (GAUZE/BANDAGES/DRESSINGS) ×2 IMPLANT
BLADE SAG 18X100X1.27 (BLADE) ×2 IMPLANT
BLADE SAW SGTL 11.0X1.19X90.0M (BLADE) ×2 IMPLANT
BOWL SMART MIX CTS (DISPOSABLE) ×2 IMPLANT
CAP KNEE TOTAL 3 SIGMA ×2 IMPLANT
CEMENT HV SMART SET (Cement) ×4 IMPLANT
CLOTH BEACON ORANGE TIMEOUT ST (SAFETY) ×2 IMPLANT
CUFF TOURN SGL QUICK 34 (TOURNIQUET CUFF) ×1
CUFF TRNQT CYL 34X4X40X1 (TOURNIQUET CUFF) ×1 IMPLANT
DECANTER SPIKE VIAL GLASS SM (MISCELLANEOUS) ×2 IMPLANT
DRAPE U-SHAPE 47X51 STRL (DRAPES) ×2 IMPLANT
DRSG ADAPTIC 3X8 NADH LF (GAUZE/BANDAGES/DRESSINGS) ×2 IMPLANT
DRSG PAD ABDOMINAL 8X10 ST (GAUZE/BANDAGES/DRESSINGS) ×2 IMPLANT
DURAPREP 26ML APPLICATOR (WOUND CARE) ×2 IMPLANT
ELECT REM PT RETURN 9FT ADLT (ELECTROSURGICAL) ×2
ELECTRODE REM PT RTRN 9FT ADLT (ELECTROSURGICAL) ×1 IMPLANT
EVACUATOR 1/8 PVC DRAIN (DRAIN) ×2 IMPLANT
GAUZE SPONGE 4X4 12PLY STRL (GAUZE/BANDAGES/DRESSINGS) ×2 IMPLANT
GLOVE BIO SURGEON STRL SZ7.5 (GLOVE) IMPLANT
GLOVE BIO SURGEON STRL SZ8 (GLOVE) ×2 IMPLANT
GLOVE BIOGEL PI IND STRL 6.5 (GLOVE) IMPLANT
GLOVE BIOGEL PI IND STRL 8 (GLOVE) ×1 IMPLANT
GLOVE BIOGEL PI INDICATOR 6.5 (GLOVE)
GLOVE BIOGEL PI INDICATOR 8 (GLOVE) ×1
GLOVE SURG SS PI 6.5 STRL IVOR (GLOVE) IMPLANT
GOWN STRL REUS W/TWL LRG LVL3 (GOWN DISPOSABLE) ×2 IMPLANT
GOWN STRL REUS W/TWL XL LVL3 (GOWN DISPOSABLE) IMPLANT
HANDPIECE INTERPULSE COAX TIP (DISPOSABLE) ×1
IMMOBILIZER KNEE 20 (SOFTGOODS) ×4 IMPLANT
IMMOBILIZER KNEE 20 THIGH 36 (SOFTGOODS) ×1 IMPLANT
MANIFOLD NEPTUNE II (INSTRUMENTS) ×2 IMPLANT
NS IRRIG 1000ML POUR BTL (IV SOLUTION) ×2 IMPLANT
PACK TOTAL KNEE CUSTOM (KITS) ×2 IMPLANT
PAD ABD 8X10 STRL (GAUZE/BANDAGES/DRESSINGS) ×2 IMPLANT
PADDING CAST ABS 6INX4YD NS (CAST SUPPLIES) ×2
PADDING CAST ABS COTTON 6X4 NS (CAST SUPPLIES) ×2 IMPLANT
PADDING CAST COTTON 6X4 STRL (CAST SUPPLIES) ×6 IMPLANT
POSITIONER SURGICAL ARM (MISCELLANEOUS) ×2 IMPLANT
SET HNDPC FAN SPRY TIP SCT (DISPOSABLE) ×1 IMPLANT
STRIP CLOSURE SKIN 1/2X4 (GAUZE/BANDAGES/DRESSINGS) ×4 IMPLANT
SUT MNCRL AB 4-0 PS2 18 (SUTURE) ×2 IMPLANT
SUT VIC AB 2-0 CT1 27 (SUTURE) ×3
SUT VIC AB 2-0 CT1 TAPERPNT 27 (SUTURE) ×3 IMPLANT
SUT VLOC 180 0 24IN GS25 (SUTURE) ×2 IMPLANT
SYR 50ML LL SCALE MARK (SYRINGE) ×2 IMPLANT
TRAY FOLEY W/METER SILVER 14FR (SET/KITS/TRAYS/PACK) ×2 IMPLANT
WATER STERILE IRR 1500ML POUR (IV SOLUTION) IMPLANT
WRAP KNEE MAXI GEL POST OP (GAUZE/BANDAGES/DRESSINGS) ×2 IMPLANT
YANKAUER SUCT BULB TIP 10FT TU (MISCELLANEOUS) ×2 IMPLANT

## 2016-02-20 NOTE — H&P (View-Only) (Signed)
Debra Knight DOB: 12/23/1945 Married / Language: Debra Knight / Race: White Female Date of Admission:  02/20/2016 CC:  Left Knee Pain History of Present Illness The patient is a 70 year old female who comes in for a preoperative History and Physical. The patient is scheduled for a left total knee arthroplasty to be performed by Dr. Dione Plover. Aluisio, MD at [facility] on 02/20/2016. The patient is a 70 year old female who presented for follow up of their knee. The patient has been followed for their bilateral knee pain and osteoarthritis. She recently underwent a Right TKA. Symptoms reported on the left include: pain. The patient feels that they are doing poorly. The following medication has been used for pain control: antiinflammatory medication (Aleve twice daily). Patient states that she received only inimal relief with injections in the past. Unfortunately the injections have not provided any long lasting benefit. She has pain at all times. All of this is limiting her activity. The most predictable means of improving pain and function long term would be knee replacement. She has been recovering well with regards to the right knee. They have been treated conservatively in the past for the above stated problem and despite conservative measures, they continue to have progressive pain and severe functional limitations and dysfunction. They have failed non-operative management including home exercise, medications, and injections. It is felt that they would benefit from undergoing total joint replacement. Risks and benefits of the procedure have been discussed with the patient and they elect to proceed with surgery. There are no active contraindications to surgery such as ongoing infection or rapidly progressive neurological disease.  Problem List/Past Medical  Primary osteoarthritis of both knees (M17.0)  Status post total right knee replacement (Z96.651)  Heart murmur  Osteoarthritis  Sleep  Apnea  uses CPAP Hypercholesterolemia  Vertigo  Tinnitus  Hypercholesterolemia  Gastroesophageal Reflux Disease  Menopause  Aortic Stenosis  Urinary tract infection, acute (N39.0)  Osteoarthritis of right knee (M17.11)  Measles  Rubella  Allergies  Penicillins  Hives. Advil *ANALGESICS - ANTI-INFLAMMATORY*  tongue swells Demerol *ANALGESICS - OPIOID*  Vomiting. TraMADol HCl *ANALGESICS - OPIOID*  GI Intolerance - Stomach Pain Robaxin *MUSCULOSKELETAL THERAPY AGENTS*  Difficulty swallowing. DUCK   Social History Tobacco use  Former smoker. 03/11/2014: smoke(d) 1 pack(s) per day Tobacco / smoke exposure  03/11/2014: no Children  2 Current drinker  03/11/2014: Currently drinks wine only occasionally per week Exercise  Exercises never No history of drug/alcohol rehab  Marital status  married Current work status  retired Living situation  live with spouse Plan - Home following the Left TKA on 02/20/2016  Medication History Glucosamine Chondroitin Complx (Oral) Active. Biotin (Oral) Specific strength unknown - Active. Aspirin (81MG  Tablet, Oral) Active. Aleve PM (220-25MG  Tablet, Oral) Active. Atorvastatin Calcium (40MG  Tablet, Oral) Active. TraMADol HCl (50MG  Tablet, Oral) Active.  Past Surgical History Dilation and Curettage of Uterus  Tonsillectomy  Total Knee Replacement - Right   Review of Systems  General Not Present- Chills, Fatigue, Fever, Memory Loss, Night Sweats, Weight Gain and Weight Loss. Skin Not Present- Eczema, Hives, Itching, Lesions and Rash. HEENT Present- Tinnitus. Not Present- Dentures, Double Vision, Headache, Hearing Loss and Visual Loss. Respiratory Not Present- Allergies, Chronic Cough, Coughing up blood, Shortness of breath at rest and Shortness of breath with exertion. Cardiovascular Not Present- Chest Pain, Difficulty Breathing Lying Down, Murmur, Palpitations, Racing/skipping heartbeats and  Swelling. Gastrointestinal Not Present- Abdominal Pain, Bloody Stool, Constipation, Diarrhea, Difficulty Swallowing, Heartburn, Jaundice,  Loss of appetitie, Nausea and Vomiting. Female Genitourinary Not Present- Blood in Urine, Discharge, Flank Pain, Incontinence, Painful Urination, Urgency, Urinary frequency, Urinary Retention, Urinating at Night and Weak urinary stream. Musculoskeletal Present- Joint Pain. Not Present- Back Pain, Joint Swelling, Morning Stiffness, Muscle Pain, Muscle Weakness and Spasms. Neurological Not Present- Blackout spells, Difficulty with balance, Dizziness, Paralysis, Tremor and Weakness. Psychiatric Not Present- Insomnia.  Vitals Weight: 222 lb Height: 60.5in Body Surface Area: 1.96 m Body Mass Index: 42.64 kg/m  BP: 158/84 (Sitting, Right Arm, Standard)  Physical Exam General Mental Status -Alert, cooperative and good historian. General Appearance-pleasant, Not in acute distress. Orientation-Oriented X3. Build & Nutrition-Well nourished and Well developed.  Head and Neck Head-normocephalic, atraumatic . Neck Global Assessment - supple, no bruit auscultated on the right, no bruit auscultated on the left.  Eye Pupil - Bilateral-Regular and Round. Motion - Bilateral-EOMI.  Chest and Lung Exam Auscultation Breath sounds - clear at anterior chest wall and clear at posterior chest wall. Adventitious sounds - No Adventitious sounds.  Cardiovascular Auscultation Rhythm - Regular rate and rhythm. Heart Sounds - S1 WNL and S2 WNL. Murmurs & Other Heart Sounds - Auscultation of the heart reveals - No Murmurs.  Abdomen Palpation/Percussion Tenderness - Abdomen is non-tender to palpation. Rigidity (guarding) - Abdomen is soft. Auscultation Auscultation of the abdomen reveals - Bowel sounds normal.  Female Genitourinary Note: Not done, not pertinent to present illness   Musculoskeletal Note: The knee show no effusion. There is  marked crepitus on range of motion of left knee with varus deformiy. Range is about 5-125. Slight tenderness medial greater than lateral with no instability noted.  RADIOGRAPHS: She has severe bone on bone arthritis, medial and patellofemoral. Left knee is bone on bone, varus deformity.   Assessment & Plan Status post total right knee replacement [id number]) Primary osteoarthritis of left knee (M17.12)  Note:Surgical Plans: Left Total Knee Replacement  Disposition: Home  Cards: Dr. Jenkins Rouge  IV TXA  Anesthesia Issues: None  Signed electronically by Ok Edwards, III PA-C

## 2016-02-20 NOTE — Anesthesia Preprocedure Evaluation (Addendum)
Anesthesia Evaluation  Patient identified by MRN, date of birth, ID band Patient awake    Reviewed: NPO status , Patient's Chart, lab work & pertinent test results  History of Anesthesia Complications (+) Family history of anesthesia reaction  Airway Mallampati: II  TM Distance: <3 FB Neck ROM: Full    Dental  (+) Teeth Intact, Loose,    Pulmonary sleep apnea , former smoker,    breath sounds clear to auscultation       Cardiovascular hypertension, + Valvular Problems/Murmurs AS and MR  Rhythm:Regular Rate:Normal + Systolic murmurs LV reveals focal basal hypertrophy of the septum. Systolic function was vigorous. The estimated ejection fraction was in the range of 65% to 70%. Wall motion was normal; there were no regional wall motion abnormalities. Doppler parameters are consistent with abnormal left ventricular relaxation (grade 1 diastolic dysfunction). Doppler parameters are consistent with elevated ventricular end-diastolic filling pressure. - Aortic valve: There was moderate stenosis. There was mild regurgitation. Mean gradient (S): 33 mm Hg. Peak gradient (S): 58 mm Hg. - Mitral valve: Severely calcified annulus. Moderately thickened, moderately calcified leaflets . Peak gradient (D): 5 mm Hg. - Left atrium: The atrium was mildly dilated. - Right ventricle: The cavity size was normal. Wall thickness was normal. Systolic function was normal. - Tricuspid valve: There was mild regurgitation. - Pulmonic valve: There was no regurgitation. - Pulmonary arteries: Systolic pressure was within the normal range. - Inferior vena cava: The vessel was normal in size. - Pericardium, extracardiac: There was no pericardial effusion.  Impressions:  - When compared to the prior study from 04/27/2015 there is no significant difference, aortic stenosis remains in the moderate range.     Neuro/Psych    GI/Hepatic Neg liver ROS,  GERD  ,  Endo/Other  Morbid obesityGained wt since last surgery  Renal/GU negative Renal ROS     Musculoskeletal  (+) Arthritis ,   Abdominal (+) + obese,   Peds  Hematology   Anesthesia Other Findings   Reproductive/Obstetrics                           Anesthesia Physical Anesthesia Plan  ASA: III  Anesthesia Plan: General   Post-op Pain Management: GA combined w/ Regional for post-op pain   Induction: Intravenous  Airway Management Planned: Oral ETT  Additional Equipment:   Intra-op Plan:   Post-operative Plan: Extubation in OR  Informed Consent: I have reviewed the patients History and Physical, chart, labs and discussed the procedure including the risks, benefits and alternatives for the proposed anesthesia with the patient or authorized representative who has indicated his/her understanding and acceptance.   Dental advisory given  Plan Discussed with: CRNA and Surgeon  Anesthesia Plan Comments:         Anesthesia Quick Evaluation

## 2016-02-20 NOTE — Interval H&P Note (Signed)
History and Physical Interval Note:  02/20/2016 12:00 PM  Debra Knight  has presented today for surgery, with the diagnosis of left knee ostoearthritis  The various methods of treatment have been discussed with the patient and family. After consideration of risks, benefits and other options for treatment, the patient has consented to  Procedure(s): LEFT TOTAL KNEE ARTHROPLASTY (Left) as a surgical intervention .  The patient's history has been reviewed, patient examined, no change in status, stable for surgery.  I have reviewed the patient's chart and labs.  Questions were answered to the patient's satisfaction.     [name]

## 2016-02-20 NOTE — Op Note (Signed)
Pre-operative diagnosis- Osteoarthritis  Left knee(s)  Post-operative diagnosis- Osteoarthritis Left knee(s)  Procedure-  Left  Total Knee Arthroplasty  Surgeon- Dione Plover. Yosgar Demirjian, MD  Assistant- Molli Barrows, PA-C   Anesthesia-  General  EBL-* No blood loss amount entered *   Drains Hemovac  Tourniquet time-  Total Tourniquet Time Documented: Thigh (Left) - 39 minutes Total: Thigh (Left) - 39 minutes     Complications- None  Condition-PACU - hemodynamically stable.   Brief Clinical Note  Debra Knight is a 70 y.o. year old female with end stage OA of her left knee with progressively worsening pain and dysfunction. She has constant pain, with activity and at rest and significant functional deficits with difficulties even with ADLs. She has had extensive non-op management including analgesics, injections of cortisone and viscosupplements, and home exercise program, but remains in significant pain with significant dysfunction. Radiographs show bone on bone arthritis medial and patellofemoral. She presents now for left Total Knee Arthroplasty.    Procedure in detail---   The patient is brought into the operating room and positioned supine on the operating table. After successful administration of  General,   a tourniquet is placed high on the  Left thigh(s) and the lower extremity is prepped and draped in the usual sterile fashion. Time out is performed by the operating team and then the  Left lower extremity is wrapped in Esmarch, knee flexed and the tourniquet inflated to 300 mmHg.       A midline incision is made with a ten blade through the subcutaneous tissue to the level of the extensor mechanism. A fresh blade is used to make a medial parapatellar arthrotomy. Soft tissue over the proximal medial tibia is subperiosteally elevated to the joint line with a knife and into the semimembranosus bursa with a Cobb elevator. Soft tissue over the proximal lateral tibia is elevated with  attention being paid to avoiding the patellar tendon on the tibial tubercle. The patella is everted, knee flexed 90 degrees and the ACL and PCL are removed. Findings are bone on bone medial and patellofemoral with large global osteophytes.        The drill is used to create a starting hole in the distal femur and the canal is thoroughly irrigated with sterile saline to remove the fatty contents. The 5 degree Left  valgus alignment guide is placed into the femoral canal and the distal femoral cutting block is pinned to remove 10 mm off the distal femur. Resection is made with an oscillating saw.      The tibia is subluxed forward and the menisci are removed. The extramedullary alignment guide is placed referencing proximally at the medial aspect of the tibial tubercle and distally along the second metatarsal axis and tibial crest. The block is pinned to remove 24mm off the more deficient medial  side. Resection is made with an oscillating saw. Size 3is the most appropriate size for the tibia and the proximal tibia is prepared with the modular drill and keel punch for that size.      The femoral sizing guide is placed and size 4 is most appropriate. Rotation is marked off the epicondylar axis and confirmed by creating a rectangular flexion gap at 90 degrees. The size 4 cutting block is pinned in this rotation and the anterior, posterior and chamfer cuts are made with the oscillating saw. The intercondylar block is then placed and that cut is made.      Trial size 3 tibial component,  trial size 4 narrow posterior stabilized femur and a 15  mm posterior stabilized rotating platform insert trial is placed. Full extension is achieved with excellent varus/valgus and anterior/posterior balance throughout full range of motion. The patella is everted and thickness measured to be 22  mm. Free hand resection is taken to 12 mm, a 35 template is placed, lug holes are drilled, trial patella is placed, and it tracks normally.  Osteophytes are removed off the posterior femur with the trial in place. All trials are removed and the cut bone surfaces prepared with pulsatile lavage. Cement is mixed and once ready for implantation, the size 3 tibial implant, size  4 narrow posterior stabilized femoral component, and the size 35 patella are cemented in place and the patella is held with the clamp. The trial insert is placed and the knee held in full extension. The Exparel (20 ml mixed with 30 ml saline) and .25% Bupivicaine, are injected into the extensor mechanism, posterior capsule, medial and lateral gutters and subcutaneous tissues.  All extruded cement is removed and once the cement is hard the permanent 15 mm posterior stabilized rotating platform insert is placed into the tibial tray.      The wound is copiously irrigated with saline solution and the extensor mechanism closed over a hemovac drain with #1 V-loc suture. The tourniquet is released for a total tourniquet time of 39  minutes. Flexion against gravity is 135 degrees and the patella tracks normally. Subcutaneous tissue is closed with 2.0 vicryl and subcuticular with running 4.0 Monocryl. The incision is cleaned and dried and steri-strips and a bulky sterile dressing are applied. The limb is placed into a knee immobilizer and the patient is awakened and transported to recovery in stable condition.      Please note that a surgical assistant was a medical necessity for this procedure in order to perform it in a safe and expeditious manner. Surgical assistant was necessary to retract the ligaments and vital neurovascular structures to prevent injury to them and also necessary for proper positioning of the limb to allow for anatomic placement of the prosthesis.   Dione Plover Bertha Lokken, MD    02/20/2016, 1:15 PM

## 2016-02-20 NOTE — Discharge Instructions (Addendum)
° °Dr. Frank Aluisio °Total Joint Specialist °[facility] °[address] °, [place] [address] °[phone] ° °TOTAL KNEE REPLACEMENT POSTOPERATIVE DIRECTIONS ° °Knee Rehabilitation, Guidelines Following Surgery  °Results after knee surgery are often greatly improved when you follow the exercise, range of motion and muscle strengthening exercises prescribed by your doctor. Safety measures are also important to protect the knee from further injury. Any time any of these exercises cause you to have increased pain or swelling in your knee joint, decrease the amount until you are comfortable again and slowly increase them. If you have problems or questions, call your caregiver or physical therapist for advice.  ° °HOME CARE INSTRUCTIONS  °Remove items at home which could result in a fall. This includes throw rugs or furniture in walking pathways.  °· ICE to the affected knee every three hours for 30 minutes at a time and then as needed for pain and swelling.  Continue to use ice on the knee for pain and swelling from surgery. You may notice swelling that will progress down to the foot and ankle.  This is normal after surgery.  Elevate the leg when you are not up walking on it.   °· Continue to use the breathing machine which will help keep your temperature down.  It is common for your temperature to cycle up and down following surgery, especially at night when you are not up moving around and exerting yourself.  The breathing machine keeps your lungs expanded and your temperature down. °· Do not place pillow under knee, focus on keeping the knee straight while resting ° °DIET °You may resume your previous home diet once your are discharged from the hospital. ° °DRESSING / WOUND CARE / SHOWERING °You may start showering once you are discharged home but do not submerge the incision under water. Just pat the incision dry and apply a dry gauze dressing on daily. °Change the surgical dressing  daily and reapply a dry dressing each time. ° °ACTIVITY °Walk with your walker as instructed. °Use walker as long as suggested by your caregivers. °Avoid periods of inactivity such as sitting longer than an hour when not asleep. This helps prevent blood clots.  °You may resume a sexual relationship in one month or when given the OK by your doctor.  °You may return to work once you are cleared by your doctor.  °Do not drive a car for 6 weeks or until released by you surgeon.  °Do not drive while taking narcotics. ° °WEIGHT BEARING °Weight bearing as tolerated with assist device (walker, cane, etc) as directed, use it as long as suggested by your surgeon or therapist, typically at least 4-6 weeks. ° °POSTOPERATIVE CONSTIPATION PROTOCOL °Constipation - defined medically as fewer than three stools per week and severe constipation as less than one stool per week. ° °One of the most common issues patients have following surgery is constipation.  Even if you have a regular bowel pattern at home, your normal regimen is likely to be disrupted due to multiple reasons following surgery.  Combination of anesthesia, postoperative narcotics, change in appetite and fluid intake all can affect your bowels.  In order to avoid complications following surgery, here are some recommendations in order to help you during your recovery period. ° °Colace (docusate) - Pick up an over-the-counter form of Colace or another stool softener and take twice a day as long as you are requiring postoperative pain medications.  Take with a full glass of water   daily.  If you experience loose stools or diarrhea, hold the colace until you stool forms back up.  If your symptoms do not get better within 1 week or if they get worse, check with your doctor. ° °Dulcolax (bisacodyl) - Pick up over-the-counter and take as directed by the product packaging as needed to assist with the movement of your bowels.  Take with a full glass of water.  Use this product as  needed if not relieved by Colace only.  ° °MiraLax (polyethylene glycol) - Pick up over-the-counter to have on hand.  MiraLax is a solution that will increase the amount of water in your bowels to assist with bowel movements.  Take as directed and can mix with a glass of water, juice, soda, coffee, or tea.  Take if you go more than two days without a movement. °Do not use MiraLax more than once per day. Call your doctor if you are still constipated or irregular after using this medication for 7 days in a row. ° °If you continue to have problems with postoperative constipation, please contact the office for further assistance and recommendations.  If you experience "the worst abdominal pain ever" or develop nausea or vomiting, please contact the office immediatly for further recommendations for treatment. ° °ITCHING ° If you experience itching with your medications, try taking only a single pain pill, or even half a pain pill at a time.  You can also use Benadryl over the counter for itching or also to help with sleep.  ° °TED HOSE STOCKINGS °Wear the elastic stockings on both legs for three weeks following surgery during the day but you may remove then at night for sleeping. ° °MEDICATIONS °See your medication summary on the “After Visit Summary” that the nursing staff will review with you prior to discharge.  You may have some home medications which will be placed on hold until you complete the course of blood thinner medication.  It is important for you to complete the blood thinner medication as prescribed by your surgeon.  Continue your approved medications as instructed at time of discharge. ° °PRECAUTIONS °If you experience chest pain or shortness of breath - call 911 immediately for transfer to the hospital emergency department.  °If you develop a fever greater that 101 F, purulent drainage from wound, increased redness or drainage from wound, foul odor from the wound/dressing, or calf pain - CONTACT YOUR  SURGEON.   °                                                °FOLLOW-UP APPOINTMENTS °Make sure you keep all of your appointments after your operation with your surgeon and caregivers. You should call the office at the above phone number and make an appointment for approximately two weeks after the date of your surgery or on the date instructed by your surgeon outlined in the "After Visit Summary". ° ° °RANGE OF MOTION AND STRENGTHENING EXERCISES  °Rehabilitation of the knee is important following a knee injury or an operation. After just a few days of immobilization, the muscles of the thigh which control the knee become weakened and shrink (atrophy). Knee exercises are designed to build up the tone and strength of the thigh muscles and to improve knee motion. Often times heat used for twenty to thirty minutes before working out will loosen   up your tissues and help with improving the range of motion but do not use heat for the first two weeks following surgery. These exercises can be done on a training (exercise) mat, on the floor, on a table or on a bed. Use what ever works the best and is most comfortable for you Knee exercises include:  Leg Lifts - While your knee is still immobilized in a splint or cast, you can do straight leg raises. Lift the leg to 60 degrees, hold for 3 sec, and slowly lower the leg. Repeat 10-20 times 2-3 times daily. Perform this exercise against resistance later as your knee gets better.  Quad and Hamstring Sets - Tighten up the muscle on the front of the thigh (Quad) and hold for 5-10 sec. Repeat this 10-20 times hourly. Hamstring sets are done by pushing the foot backward against an object and holding for 5-10 sec. Repeat as with quad sets.   Leg Slides: Lying on your back, slowly slide your foot toward your buttocks, bending your knee up off the floor (only go as far as is comfortable). Then slowly slide your foot back down until your leg is flat on the floor again.  Angel Wings:  Lying on your back spread your legs to the side as far apart as you can without causing discomfort.  A rehabilitation program following serious knee injuries can speed recovery and prevent re-injury in the future due to weakened muscles. Contact your doctor or a physical therapist for more information on knee rehabilitation.   IF YOU ARE TRANSFERRED TO A SKILLED REHAB FACILITY If the patient is transferred to a skilled rehab facility following release from the hospital, a list of the current medications will be sent to the facility for the patient to continue.  When discharged from the skilled rehab facility, please have the facility set up the patient's [place] prior to being released. Also, the skilled facility will be responsible for providing the patient with their medications at time of release from the facility to include their pain medication, the muscle relaxants, and their blood thinner medication. If the patient is still at the rehab facility at time of the two week follow up appointment, the skilled rehab facility will also need to assist the patient in arranging follow up appointment in our office and any transportation needs.  MAKE SURE YOU:  Understand these instructions.  Get help right away if you are not doing well or get worse.    Pick up stool softner and laxative for home use following surgery while on pain medications. Do not submerge incision under water. Please use good hand washing techniques while changing dressing each day. May shower starting three days after surgery. Please use a clean towel to pat the incision dry following showers. Continue to use ice for pain and swelling after surgery. Do not use any lotions or creams on the incision until instructed by your surgeon.  Information on my medicine - XARELTO (Rivaroxaban)  This medication education was reviewed with me or my healthcare representative as part of my discharge preparation.  The  pharmacist that spoke with me during my hospital stay was:  Biagio Borg, [facility]  Why was Xarelto prescribed for you? Xarelto was prescribed for you to reduce the risk of blood clots forming after orthopedic surgery. The medical term for these abnormal blood clots is venous thromboembolism (VTE).  What do you need to know about xarelto ? Take your Xarelto ONCE DAILY at  the same time every day. You may take it either with or without food.  If you have difficulty swallowing the tablet whole, you may crush it and mix in applesauce just prior to taking your dose.  Take Xarelto exactly as prescribed by your doctor and DO NOT stop taking Xarelto without talking to the doctor who prescribed the medication.  Stopping without other VTE prevention medication to take the place of Xarelto may increase your risk of developing a clot.  After discharge, you should have regular check-up appointments with your healthcare provider that is prescribing your Xarelto.    What do you do if you miss a dose? If you miss a dose, take it as soon as you remember on the same day then continue your regularly scheduled once daily regimen the next day. Do not take two doses of Xarelto on the same day.   Important Safety Information A possible side effect of Xarelto is bleeding. You should call your healthcare provider right away if you experience any of the following: ? Bleeding from an injury or your nose that does not stop. ? Unusual colored urine (red or dark brown) or unusual colored stools (red or black). ? Unusual bruising for unknown reasons. ? A serious fall or if you hit your head (even if there is no bleeding).  Some medicines may interact with Xarelto and might increase your risk of bleeding while on Xarelto. To help avoid this, consult your healthcare provider or pharmacist prior to using any new prescription or non-prescription medications, including herbals, vitamins, non-steroidal  anti-inflammatory drugs (NSAIDs) and supplements.  This website has more information on Xarelto: [email or web address]

## 2016-02-20 NOTE — Progress Notes (Signed)
AssistedDr. Massagee with left, ultrasound guided, adductor canal block. Side rails up, monitors on throughout procedure. See vital signs in flow sheet. Tolerated Procedure well.  

## 2016-02-20 NOTE — Transfer of Care (Signed)
Immediate Anesthesia Transfer of Care Note  Patient: Debra Knight  Procedure(s) Performed: Procedure(s): LEFT TOTAL KNEE ARTHROPLASTY (Left)  Patient Location: PACU  Anesthesia Type:General  Level of Consciousness:  sedated, patient cooperative and responds to stimulation  Airway & Oxygen Therapy:Patient Spontanous Breathing and Patient connected to face mask oxgen  Post-op Assessment:  Report given to PACU RN and Post -op Vital signs reviewed and stable  Post vital signs:  Reviewed and stable  Last Vitals:  Filed Vitals:   02/20/16 1150 02/20/16 1155  BP: 128/67 130/60  Pulse: 78 79  Temp:    Resp: 13 20    Complications: No apparent anesthesia complications

## 2016-02-20 NOTE — Anesthesia Procedure Notes (Addendum)
Anesthesia Regional Block:  Adductor canal block  Pre-Anesthetic Checklist: ,, timeout performed, Correct Patient, Correct Site, Correct Laterality, Correct Procedure, Correct Position, site marked, Risks and benefits discussed,  Surgical consent,  Pre-op evaluation,  At surgeon's request and post-op pain management  Laterality: Left and Lower  Prep: chloraprep and alcohol swabs       Needles:   Needle Type: Echogenic Stimulator Needle     Needle Length: 9cm 9 cm Needle Gauge: 21 and 21 G  Needle insertion depth: 9 cm   Additional Needles:  Procedures: ultrasound guided (picture in chart) Adductor canal block Narrative:  Start time: 02/20/2016 11:15 AM End time: 02/20/2016 11:35 AM Injection made incrementally with aspirations every 5 mL.  Performed by: Personally  Anesthesiologist: MASSAGEE, TERRY  Additional Notes: Tolerated well   Procedure Name: Intubation Date/Time: 02/20/2016 12:19 PM Performed by: Gil Ingwersen Pre-anesthesia Checklist: Patient identified, Emergency Drugs available, Suction available, Patient being monitored and Timeout performed Patient Re-evaluated:Patient Re-evaluated prior to inductionOxygen Delivery Method: Circle system utilized Preoxygenation: Pre-oxygenation with 100% oxygen Intubation Type: IV induction Ventilation: Mask ventilation without difficulty Laryngoscope Size: Mac and 4 Grade View: Grade I Tube type: Oral Tube size: 7.5 mm Number of attempts: 1 Airway Equipment and Method: Stylet Placement Confirmation: ETT inserted through vocal cords under direct vision,  positive ETCO2,  CO2 detector and breath sounds checked- equal and bilateral Secured at: 21 cm Tube secured with: Tape Dental Injury: Teeth and Oropharynx as per pre-operative assessment

## 2016-02-20 NOTE — Anesthesia Postprocedure Evaluation (Signed)
Anesthesia Post Note  Patient: Debra Knight  Procedure(s) Performed: Procedure(s) (LRB): LEFT TOTAL KNEE ARTHROPLASTY (Left)  Patient location during evaluation: PACU Anesthesia Type: General and Regional Level of consciousness: awake and alert Pain management: pain level controlled Vital Signs Assessment: post-procedure vital signs reviewed and stable Respiratory status: spontaneous breathing, nonlabored ventilation, respiratory function stable and patient connected to nasal cannula oxygen Cardiovascular status: blood pressure returned to baseline and stable Postop Assessment: no signs of nausea or vomiting Anesthetic complications: no    Last Vitals:  Filed Vitals:   02/20/16 1415 02/20/16 1435  BP: 140/70 112/62  Pulse: 87 85  Temp:  36.6 C  Resp: 15 16    Last Pain:  Filed Vitals:   02/20/16 1437  PainSc: 4                  Tris Howell,JAMES TERRILL

## 2016-02-21 LAB — CBC
HCT: 33.1 % — ABNORMAL LOW (ref 36.0–46.0)
HEMOGLOBIN: 11.1 g/dL — AB (ref 12.0–15.0)
MCH: 29.9 pg (ref 26.0–34.0)
MCHC: 33.5 g/dL (ref 30.0–36.0)
MCV: 89.2 fL (ref 78.0–100.0)
PLATELETS: 221 10*3/uL (ref 150–400)
RBC: 3.71 MIL/uL — AB (ref 3.87–5.11)
RDW: 13.6 % (ref 11.5–15.5)
WBC: 9.8 10*3/uL (ref 4.0–10.5)

## 2016-02-21 LAB — BASIC METABOLIC PANEL
Anion gap: 7 (ref 5–15)
BUN: 11 mg/dL (ref 6–20)
CHLORIDE: 105 mmol/L (ref 101–111)
CO2: 24 mmol/L (ref 22–32)
CREATININE: 0.49 mg/dL (ref 0.44–1.00)
Calcium: 8.4 mg/dL — ABNORMAL LOW (ref 8.9–10.3)
Glucose, Bld: 162 mg/dL — ABNORMAL HIGH (ref 65–99)
Potassium: 4 mmol/L (ref 3.5–5.1)
SODIUM: 136 mmol/L (ref 135–145)

## 2016-02-21 NOTE — Evaluation (Signed)
Physical Therapy Evaluation Patient Details Name: Debra Knight MRN: [id number] DOB: Jan 15, 1946 Today's Date: 02/21/2016   History of Present Illness  L TKA  Clinical Impression  The patient tolerated ambulating x 150'. Plans DC home with HHPT initially, then OPPT. Pt admitted with above diagnosis. Pt currently with functional limitations due to the deficits listed below (see PT Problem List). Pt will benefit from skilled PT to increase their independence and safety with mobility to allow discharge to the venue listed below.     Follow Up Recommendations Home health PT;Supervision/Assistance - 24 hour    Equipment Recommendations  None recommended by PT    Recommendations for Other Services       Precautions / Restrictions Precautions Precautions: Fall;Knee Precaution Comments: has h/o vertogo Required Braces or Orthoses: Knee Immobilizer - Left Knee Immobilizer - Left: Discontinue once straight leg raise with < 10 degree lag Restrictions Weight Bearing Restrictions: No      Mobility  Bed Mobility Overal bed mobility: Needs Assistance Bed Mobility: Supine to Sit     Supine to sit: Min assist     General bed mobility comments: support L leg, from high bed  Transfers Overall transfer level: Needs assistance   Transfers: Sit to/from Stand Sit to Stand: Min assist         General transfer comment: freom high bed, has step at home  Ambulation/Gait Ambulation/Gait assistance: Min assist Ambulation Distance (Feet): 150 Feet Assistive device: Rolling walker (2 wheeled) Gait Pattern/deviations: Step-to pattern;Step-through pattern     General Gait Details: cues for safety, to turn slowwly, tends to move quickly  Stairs            Wheelchair Mobility    Modified Rankin (Stroke Patients Only)       Balance                                             Pertinent Vitals/Pain Pain Assessment: 0-10 Pain Score: 4  Pain Location: Lt  knee Pain Descriptors / Indicators: Aching Pain Intervention(s): Limited activity within patient's tolerance;Monitored during session;Premedicated before session;Ice applied    Home Living Family/patient expects to be discharged to:: Private residence Living Arrangements: Spouse/significant other Available Help at Discharge: Family Type of Home: House Home Access: Level entry     Home Layout: One [place]: Environmental consultant - 2 wheels;Bedside commode      Prior Function Level of Independence: Independent               Hand Dominance        Extremity/Trunk Assessment   Upper Extremity Assessment: Defer to OT evaluation           Lower Extremity Assessment: LLE deficits/detail   LLE Deficits / Details: knee frlexion to 45  Cervical / Trunk Assessment: Normal  Communication   Communication: No difficulties  Cognition                            General Comments      Exercises Total Joint Exercises Ankle Circles/Pumps: AROM;Both;10 reps Quad Sets: AROM;Both;10 reps Towel Squeeze: AROM;Both;10 reps Heel Slides: AAROM;Left;10 reps Hip ABduction/ADduction: AAROM;Left;10 reps Straight Leg Raises: AAROM;Left;10 reps      Assessment/Plan    PT Assessment Patient needs continued PT services  PT Diagnosis Difficulty walking;Acute pain   PT Problem  List Decreased range of motion;Decreased activity tolerance;Decreased mobility;Pain;Decreased safety awareness;Decreased knowledge of use of DME;Decreased knowledge of precautions  PT Treatment Interventions DME instruction;Gait training;Functional mobility training;Therapeutic activities;Therapeutic exercise;Patient/family education   PT Goals (Current goals can be found in the Care Plan section) Acute Rehab PT Goals Patient Stated Goal: to go home tomorrow PT Goal Formulation: With patient/family Time For Goal Achievement: 02/24/16 Potential to Achieve Goals: Good    Frequency 7X/week   Barriers  to discharge        Co-evaluation               End of Session Equipment Utilized During Treatment: Left knee immobilizer Activity Tolerance: Patient tolerated treatment well Patient left: in chair;with call bell/phone within reach;with chair alarm set;with family/visitor present Nurse Communication: Mobility status         Time: [id number] PT Time Calculation (min) (ACUTE ONLY): 41 min   Charges:   PT Evaluation $PT Eval Low Complexity: 1 Procedure PT Treatments $Gait Training: 8-22 mins $Therapeutic Exercise: 8-22 mins   PT G Codes:        Claretha Cooper 02/21/2016, 10:52 AM Tresa Endo PT [phone]

## 2016-02-21 NOTE — Progress Notes (Signed)
Physical Therapy Treatment Patient Details Name: Debra Knight MRN: [id number] DOB: 23-Nov-1945 Today's Date: 02/21/2016    History of Present Illness L TKA    PT Comments    Progressing well. Eager for DC tomorrow.  Follow Up Recommendations  Home health PT;Supervision/Assistance - 24 hour     Equipment Recommendations  None recommended by PT    Recommendations for Other Services       Precautions / Restrictions Precautions Precautions: Fall;Knee Precaution Comments: has h/o vertogo Required Braces or Orthoses: Knee Immobilizer - Left Knee Immobilizer - Left: Discontinue once straight leg raise with < 10 degree lag Restrictions Weight Bearing Restrictions: No Other Position/Activity Restrictions: WBAT    Mobility  Bed Mobility   Bed Mobility: Sit to Supine       Sit to supine: Supervision   General bed mobility comments: raised own lt leg  Transfers Overall transfer level: Needs assistance Equipment used: Rolling walker (2 wheeled) Transfers: Sit to/from Stand Sit to Stand: Supervision            Ambulation/Gait Ambulation/Gait assistance: Min guard;Min assist Ambulation Distance (Feet): 175 Feet Assistive device: Rolling walker (2 wheeled) Gait Pattern/deviations: Step-to pattern;Step-through pattern     General Gait Details: cues for safety,  cues for step length,to turn slowly, tends to move quickly   Stairs            Wheelchair Mobility    Modified Rankin (Stroke Patients Only)       Balance                                    Cognition Arousal/Alertness: Awake/alert Behavior During Therapy: WFL for tasks assessed/performed Overall Cognitive Status: Within Functional Limits for tasks assessed                      Exercises      General Comments        Pertinent Vitals/Pain Pain Assessment: 0-10 Pain Score: 4  Pain Location: left knee Pain Descriptors / Indicators: Aching;Tightness Pain  Intervention(s): Limited activity within patient's tolerance;Monitored during session;Repositioned;Premedicated before session    Home Living Family/patient expects to be discharged to:: Private residence Living Arrangements: Spouse/significant other Available Help at Discharge: Family Type of Home: House Home Access: Level entry   Home Layout: One level Home Equipment: Environmental consultant - 2 wheels;Bedside commode Additional Comments: uses BSC in shower    Prior Function Level of Independence: Independent          PT Goals (current goals can now be found in the care plan section) Acute Rehab PT Goals Patient Stated Goal: to go home tomorrow Progress towards PT goals: Progressing toward goals    Frequency  7X/week    PT Plan Current plan remains appropriate    Co-evaluation             End of Session Equipment Utilized During Treatment: Left knee immobilizer Activity Tolerance: Patient tolerated treatment well Patient left: in bed;with call bell/phone within reach;with family/visitor present     Time: [id number] PT Time Calculation (min) (ACUTE ONLY): 22 min  Charges:  $Gait Training: 8-22 mins                    G Codes:      Claretha Cooper 02/21/2016, 3:57 PM

## 2016-02-21 NOTE — Progress Notes (Signed)
   Subjective: 1 Day Post-Op Procedure(s) (LRB): LEFT TOTAL KNEE ARTHROPLASTY (Left) Patient reports pain as mild.   Patient seen in rounds with Dr. Wynelle Link. Patient is well, and has had no acute complaints or problems. Reports that pain is under good control. No issues overnight but did not sleep well.  We will start therapy today.  Plan is to go Home after hospital stay.  Objective: Vital signs in last 24 hours: Temp:  [97.6 F (36.4 C)-98.5 F (36.9 C)] 97.6 F (36.4 C) (06/20 0607) Pulse Rate:  [71-90] 71 (06/20 0607) Resp:  [8-22] 16 (06/20 0607) BP: (112-157)/(53-106) 120/56 mmHg (06/20 0607) SpO2:  [93 %-100 %] 93 % (06/20 0607) Weight:  [104.327 kg (230 lb)] 104.327 kg (230 lb) (06/19 1018)  Intake/Output from previous day:  Intake/Output Summary (Last 24 hours) at 02/21/16 0725 Last data filed at 02/21/16 [id number]  Gross per 24 hour  Intake 2853.75 ml  Output   2120 ml  Net 733.75 ml     Labs:  Recent Labs  02/21/16 0431  HGB 11.1*    Recent Labs  02/21/16 0431  WBC 9.8  RBC 3.71*  HCT 33.1*  PLT 221    Recent Labs  02/21/16 0431  NA 136  K 4.0  CL 105  CO2 24  BUN 11  CREATININE 0.49  GLUCOSE 162*  CALCIUM 8.4*    EXAM General - Patient is Alert and Oriented Extremity - Neurologically intact Intact pulses distally Dorsiflexion/Plantar flexion intact Dressing - dressing C/D/I Motor Function - intact, moving foot and toes well on exam.  Hemovac pulled without difficulty.  Past Medical History  Diagnosis Date  . Hyperlipidemia   . Aortic stenosis     moderate on Echo 09/2013, Dr. Johnsie Cancel monitoring  . Carotid bruit     LICA 123456 [id number] doppler "right"  . Osteoarthritis of both knees   . Vertigo 07/2014    8 weeks  . GERD (gastroesophageal reflux disease)   . Tinnitus   . Enlarged thyroid   . Seasonal allergies   . Epiglottitis     hx of 40 years ago  . Family history of adverse reaction to anesthesia     "father- age 70 had  confusion from anesthesia"  . Heart murmur   . OSA (obstructive sleep apnea)     no cpap   . Vertigo     Assessment/Plan: 1 Day Post-Op Procedure(s) (LRB): LEFT TOTAL KNEE ARTHROPLASTY (Left) Principal Problem:   OA (osteoarthritis) of knee  Estimated body mass index is 43.48 kg/(m^2) as calculated from the following:   Height as of this encounter: 5\' 1"  (1.549 m).   Weight as of this encounter: 104.327 kg (230 lb). Advance diet Up with therapy D/C IV fluids when tolerating POs well  DVT Prophylaxis - Xarelto Weight-Bearing as tolerated to left leg D/C O2 and Pulse OX and try on Room Air  Will get up with therapy today. Plan for DC home tomorrow. Will DC CPM.   Ardeen Jourdain, PA-C Orthopaedic Surgery 02/21/2016, 7:25 AM

## 2016-02-21 NOTE — Progress Notes (Signed)
Occupational Therapy Evaluation Patient Details Name: Debra Knight MRN: [id number] DOB: 08/16/1946 Today's Date: 02/21/2016    History of Present Illness L TKA   Clinical Impression   Patient presents to OT with decreased ADL independence and safety s/p L TKA. Will benefit from skilled OT to maximize function and to facilitate a safe discharge. OT will follow.    Follow Up Recommendations  No OT follow up;Supervision/Assistance - 24 hour    Equipment Recommendations  None recommended by OT    Recommendations for Other Services       Precautions / Restrictions Precautions Precautions: Fall;Knee Precaution Comments: has h/o vertogo Required Braces or Orthoses: Knee Immobilizer - Left Knee Immobilizer - Left: Discontinue once straight leg raise with < 10 degree lag Restrictions Weight Bearing Restrictions: No Other Position/Activity Restrictions: WBAT      Mobility Bed Mobility            General bed mobility comments: NT -- up in chair  Transfers Overall transfer level: Needs assistance Equipment used: Rolling walker (2 wheeled) Transfers: Sit to/from Stand Sit to Stand: Min assist             Balance                                            ADL Overall ADL's : Needs assistance/impaired Eating/Feeding: Independent;Sitting   Grooming: Wash/dry hands;Min guard;Standing   Upper Body Bathing: Set up;Sitting   Lower Body Bathing: Minimal assistance;Sit to/from stand   Upper Body Dressing : Set up;Sitting   Lower Body Dressing: Moderate assistance;Sit to/from stand   Toilet Transfer: Minimal assistance;Comfort height toilet;Ambulation;BSC;RW   Toileting- Water quality scientist and Hygiene: Min guard;Sit to/from stand       Functional mobility during ADLs: Min guard;Minimal assistance;Rolling walker General ADL Comments: Patient practiced toileting/grooming task this session. Began education on LB dressing techniques, but  pt did not wish to don underwear/pants during session. Plan to focus on LB self-care and shower transfer next session. Patient familiar with using BSC in shower as shower chair if needed.     Vision     Perception     Praxis      Pertinent Vitals/Pain Pain Assessment: 0-10 Pain Score: 5  Pain Location: L knee Pain Descriptors / Indicators: Aching;Sore Pain Intervention(s): Limited activity within patient's tolerance;Monitored during session;Repositioned;Ice applied     Hand Dominance     Extremity/Trunk Assessment Upper Extremity Assessment Upper Extremity Assessment: Overall WFL for tasks assessed   Lower Extremity Assessment Lower Extremity Assessment: Defer to PT evaluation    Cervical / Trunk Assessment Cervical / Trunk Assessment: Normal   Communication Communication Communication: No difficulties   Cognition Arousal/Alertness: Awake/alert Behavior During Therapy: WFL for tasks assessed/performed Overall Cognitive Status: Within Functional Limits for tasks assessed                     General Comments       Exercises       Shoulder Instructions      Home Living Family/patient expects to be discharged to:: Private residence Living Arrangements: Spouse/significant other Available Help at Discharge: Family Type of Home: House Home Access: Level entry     Home Layout: One level     Bathroom Shower/Tub: Occupational psychologist: Standard     Home Equipment: Environmental consultant - 2 wheels;Bedside commode  Additional Comments: uses BSC in shower      Prior Functioning/Environment Level of Independence: Independent             OT Diagnosis: Acute pain   OT Problem List: Decreased strength;Decreased range of motion;Decreased activity tolerance;Decreased knowledge of use of DME or AE;Pain   OT Treatment/Interventions: Self-care/ADL training;DME and/or AE instruction;Therapeutic activities;Patient/family education    OT Goals(Current  goals can be found in the care plan section) Acute Rehab OT Goals Patient Stated Goal: to go home tomorrow OT Goal Formulation: With patient Time For Goal Achievement: 03/06/16 Potential to Achieve Goals: Good ADL Goals Pt Will Perform Lower Body Bathing: with min assist;sit to/from stand Pt Will Perform Lower Body Dressing: with min assist;sit to/from stand Pt Will Transfer to Toilet: with supervision;ambulating;bedside commode Pt Will Perform Toileting - Clothing Manipulation and hygiene: with supervision;sit to/from stand Pt Will Perform Tub/Shower Transfer: Shower transfer;with supervision;ambulating;3 in 1;rolling walker  OT Frequency: Min 2X/week   Barriers to D/C:            Co-evaluation              End of Session Equipment Utilized During Treatment: Rolling walker;Left knee immobilizer Nurse Communication: Mobility status  Activity Tolerance: Patient tolerated treatment well Patient left: in chair;with call bell/phone within reach;with chair alarm set;with family/visitor present   Time: [id number] OT Time Calculation (min): 16 min Charges:  OT General Charges $OT Visit: 1 Procedure OT Evaluation $OT Eval Low Complexity: 1 Procedure G-Codes:    Loral Campi A February 25, 2016, 1:33 PM

## 2016-02-21 NOTE — Care Management Note (Signed)
Case Management Note  Patient Details  Name: Debra Knight MRN: [id number] Date of Birth: 12-22-1945  Subjective/Objective:                  LEFT TOTAL KNEE ARTHROPLASTY (Left) Action/Plan: Discharge planning Expected Discharge Date:  02/21/16               Expected Discharge Plan:  [place]  In-House Referral:     Discharge planning Services  CM Consult  Post Acute Care Choice:    Choice offered to:     DME Arranged:  N/A DME Agency:  NA  HH Arranged:  PT HH Agency:  [facility]  Status of Service:  Completed, signed off  Medicare Important Message Given:    Date Medicare IM Given:    Medicare IM give by:    Date Additional Medicare IM Given:    Additional Medicare Important Message give by:     If discussed at [place] of Stay Meetings, dates discussed:    Additional Comments: CM met with pt in room to offer choice of home health agency.  [facility] chooses [facility] to render HHPT.  Referral with request for [facility] given to [facility] rep, tim.  Pt has all DME fromprevious surgery and does not request additional DMe.  No other CM needs were communicated. Dellie Catholic, RN 02/21/2016, 10:35 AM

## 2016-02-22 LAB — CBC
HCT: 31.4 % — ABNORMAL LOW (ref 36.0–46.0)
HEMOGLOBIN: 10.4 g/dL — AB (ref 12.0–15.0)
MCH: 29.5 pg (ref 26.0–34.0)
MCHC: 33.1 g/dL (ref 30.0–36.0)
MCV: 89 fL (ref 78.0–100.0)
PLATELETS: 224 10*3/uL (ref 150–400)
RBC: 3.53 MIL/uL — AB (ref 3.87–5.11)
RDW: 13.9 % (ref 11.5–15.5)
WBC: 9.8 10*3/uL (ref 4.0–10.5)

## 2016-02-22 LAB — BASIC METABOLIC PANEL
ANION GAP: 6 (ref 5–15)
BUN: 16 mg/dL (ref 6–20)
CO2: 26 mmol/L (ref 22–32)
Calcium: 8.4 mg/dL — ABNORMAL LOW (ref 8.9–10.3)
Chloride: 104 mmol/L (ref 101–111)
Creatinine, Ser: 0.58 mg/dL (ref 0.44–1.00)
GFR calc Af Amer: >60 mL/min (ref >60)
GLUCOSE: 137 mg/dL — AB (ref 65–99)
POTASSIUM: 4 mmol/L (ref 3.5–5.1)
Sodium: 136 mmol/L (ref 135–145)

## 2016-02-22 NOTE — Progress Notes (Signed)
Occupational Therapy Treatment Patient Details Name: Debra Knight MRN: [id number] DOB: 03-10-1946 Today's Date: 02/22/2016    History of present illness L TKA   OT comments  All OT education completed and pt questions answered. No further OT needs at this time. Will sign off.  Follow Up Recommendations  No OT follow up;Supervision/Assistance - 24 hour    Equipment Recommendations  None recommended by OT    Recommendations for Other Services      Precautions / Restrictions Precautions Precautions: Fall;Knee Precaution Comments: has h/o vertogo Required Braces or Orthoses: Knee Immobilizer - Left Knee Immobilizer - Left: Discontinue once straight leg raise with < 10 degree lag Restrictions Weight Bearing Restrictions: No Other Position/Activity Restrictions: WBAT       Mobility Bed Mobility                  Transfers                      Balance                                   ADL Overall ADL's : Needs assistance/impaired Eating/Feeding: Independent;Sitting   Grooming: Oral care;Set up;Sitting                                 General ADL Comments: Patient received doing grooming tasks. Educated on role of OT. Patient provided with demonstration of shower transfer. Declined to practice. Patient reports husband to assist with LB dressing and does not wish to get dressed at this time. Reviewed LB dressing techniques again with patient. She verbalized understanding. No further OT needs. Will sign off.      Vision                     Perception     Praxis      Cognition   Behavior During Therapy: WFL for tasks assessed/performed Overall Cognitive Status: Within Functional Limits for tasks assessed                       Extremity/Trunk Assessment               Exercises     Shoulder Instructions       General Comments      Pertinent Vitals/ Pain       Pain Assessment: 0-10 Pain  Score: 2  Pain Location: L knee Pain Descriptors / Indicators: Aching;Sore Pain Intervention(s): Limited activity within patient's tolerance;Monitored during session  Home Living                                          Prior Functioning/Environment              Frequency       Progress Toward Goals  OT Goals(current goals can now be found in the care plan section)  Progress towards OT goals: Goals met/education completed, patient discharged from [facility] All goals met and education completed, patient discharged from OT services    Co-evaluation                 End of Session    Activity Tolerance Patient tolerated  treatment well   Patient Left in bed;with call bell/phone within reach;with bed alarm set   Nurse Communication          Time: [phone] OT Time Calculation (min): 12 min  Charges: OT General Charges $OT Visit: 1 Procedure OT Treatments $Self Care/Home Management : 8-22 mins  Debra Knight A 02/22/2016, 9:16 AM

## 2016-02-22 NOTE — Progress Notes (Signed)
Pt to d/c home with [facility] home [facility]. AVS reviewed and "My Chart" discussed with pt. Pt capable of verbalizing medications, dressing changes, signs and symptoms of infection, and follow-up appointments. Remains hemodynamically stable. No signs and symptoms of distress. Educated pt to return to ER in the case of SOB, dizziness, or chest pain.  

## 2016-02-22 NOTE — Progress Notes (Signed)
Physical Therapy Treatment Patient Details Name: Debra Knight MRN: [id number] DOB: Jan 12, 1946 Today's Date: 02/22/2016    History of Present Illness L TKA    PT Comments    POD # 2 Am session.  This is pt's second TKR and very knowledgeable.  Had spouse assist pt OOB to amb in hallway under direction of therapist.  Pt feeling slightly dizzy.  Required rest break.  Follow Up Recommendations  Home health PT;Supervision/Assistance - 24 hour     Equipment Recommendations  None recommended by PT    Recommendations for Other Services       Precautions / Restrictions Precautions Precautions: Fall;Knee Precaution Comments: has h/o vertogo Required Braces or Orthoses: Knee Immobilizer - Left Knee Immobilizer - Left: Discontinue once straight leg raise with < 10 degree lag Restrictions Weight Bearing Restrictions: No Other Position/Activity Restrictions: WBAT    Mobility  Bed Mobility Overal bed mobility: Needs Assistance Bed Mobility: Supine to Sit     Supine to sit: Min guard;Min assist     General bed mobility comments: had spouse assist pt OOB under direction of therapist on safe handling.    Transfers Overall transfer level: Needs assistance Equipment used: Rolling walker (2 wheeled) Transfers: Sit to/from Stand Sit to Stand: Supervision         General transfer comment: one intial VC on proper hand placement  Ambulation/Gait Ambulation/Gait assistance: Supervision;Min guard Ambulation Distance (Feet): 58 Feet Assistive device: Rolling walker (2 wheeled) Gait Pattern/deviations: Step-to pattern;Decreased stance time - left Gait velocity: decreased   General Gait Details: cues for safety,  cues for step length,to turn slowly, tends to move quickly.  amb with spouse under therapist direction   Stairs            Wheelchair Mobility    Modified Rankin (Stroke Patients Only)       Balance                                     Cognition Arousal/Alertness: Awake/alert Behavior During Therapy: WFL for tasks assessed/performed Overall Cognitive Status: Within Functional Limits for tasks assessed                      Exercises      General Comments        Pertinent Vitals/Pain Pain Assessment: 0-10 Pain Score: 3  Pain Location: L knee Pain Descriptors / Indicators: Aching;Sore Pain Intervention(s): Monitored during session;Repositioned    Home Living                      Prior Function            PT Goals (current goals can now be found in the care plan section) Progress towards PT goals: Progressing toward goals    Frequency  7X/week    PT Plan Current plan remains appropriate    Co-evaluation             End of Session Equipment Utilized During Treatment: Left knee immobilizer Activity Tolerance: Patient tolerated treatment well Patient left: in chair;with call bell/phone within reach     Time: 1032-1045 PT Time Calculation (min) (ACUTE ONLY): 13 min  Charges:  $Gait Training: 8-22 mins                    G Codes:      Chaden Doom  PTA [employer]  Acute  Rehab Pager      [phone]

## 2016-02-22 NOTE — Progress Notes (Signed)
   Subjective: 2 Days Post-Op Procedure(s) (LRB): LEFT TOTAL KNEE ARTHROPLASTY (Left) Patient reports pain as mild.   Patient seen in rounds with Dr. Wynelle Link. Patient is well, and has had no acute complaints or problems. No SOB or chest pain. Voiding well. Positive flatus.  Plan is to go Home after hospital stay.  Objective: Vital signs in last 24 hours: Temp:  [98.1 F (36.7 C)-99.5 F (37.5 C)] 98.1 F (36.7 C) (06/21 0536) Pulse Rate:  [74-83] 78 (06/21 0536) Resp:  [16] 16 (06/21 0536) BP: (104-133)/(49-75) 133/52 mmHg (06/21 0536) SpO2:  [94 %-99 %] 98 % (06/21 0536)  Intake/Output from previous day:  Intake/Output Summary (Last 24 hours) at 02/22/16 0836 Last data filed at 02/22/16 0813  Gross per 24 hour  Intake   1725 ml  Output   1950 ml  Net   -225 ml    Intake/Output this shift: Total I/O In: 240 [P.O.:240] Out: -   Labs:  Recent Labs  02/21/16 0431 02/22/16 0425  HGB 11.1* 10.4*    Recent Labs  02/21/16 0431 02/22/16 0425  WBC 9.8 9.8  RBC 3.71* 3.53*  HCT 33.1* 31.4*  PLT 221 224    Recent Labs  02/21/16 0431 02/22/16 0425  NA 136 136  K 4.0 4.0  CL 105 104  CO2 24 26  BUN 11 16  CREATININE 0.49 0.58  GLUCOSE 162* 137*  CALCIUM 8.4* 8.4*    EXAM General - Patient is Alert and Oriented Extremity - Neurologically intact Intact pulses distally Dorsiflexion/Plantar flexion intact No cellulitis present Compartment soft Dressing/Incision - clean, dry, no drainage Motor Function - intact, moving foot and toes well on exam.   Past Medical History  Diagnosis Date  . Hyperlipidemia   . Aortic stenosis     moderate on Echo 09/2013, Dr. Johnsie Cancel monitoring  . Carotid bruit     LICA 123456 [id number] doppler "right"  . Osteoarthritis of both knees   . Vertigo 07/2014    8 weeks  . GERD (gastroesophageal reflux disease)   . Tinnitus   . Enlarged thyroid   . Seasonal allergies   . Epiglottitis     hx of 40 years ago  . Family history  of adverse reaction to anesthesia     "father- age 38 had confusion from anesthesia"  . Heart murmur   . OSA (obstructive sleep apnea)     no cpap   . Vertigo     Assessment/Plan: 2 Days Post-Op Procedure(s) (LRB): LEFT TOTAL KNEE ARTHROPLASTY (Left) Principal Problem:   OA (osteoarthritis) of knee  Estimated body mass index is 43.48 kg/(m^2) as calculated from the following:   Height as of this encounter: 5\' 1"  (1.549 m).   Weight as of this encounter: 104.327 kg (230 lb). Advance diet Up with therapy Discharge home with home health  DVT Prophylaxis - Xarelto Weight-Bearing as tolerated   Continue therapy this morning with DC home after.  Ardeen Jourdain, PA-C Orthopaedic Surgery 02/22/2016, 8:36 AM

## 2016-02-22 NOTE — Progress Notes (Signed)
Physical Therapy Treatment Patient Details Name: LATIARA CLAYTON MRN: [id number] DOB: 04/08/1946 Today's Date: 02/22/2016    History of Present Illness L TKA    PT Comments    The patient relates her feeling dizzy and not well to lack of sleep. Orthostatics taken, documented in doc flowsheets.Rzn aware  Of BP and feeling poorly.  Follow Up Recommendations  Home health PT;Supervision/Assistance - 24 hour     Equipment Recommendations  None recommended by PT    Recommendations for Other Services       Precautions / Restrictions Precautions Precautions: Fall;Knee Precaution Comments: has h/o vertogo Required Braces or Orthoses: Knee Immobilizer - Left Knee Immobilizer - Left: Discontinue once straight leg raise with < 10 degree lag Restrictions Weight Bearing Restrictions: No Other Position/Activity Restrictions: WBAT    Mobility  Bed Mobility Overal bed mobility: Needs Assistance Bed Mobility: Supine to Sit     Supine to sit: Min guard;Min assist     General bed mobility comments: had spouse assist pt OOB under direction of therapist on safe handling.    Transfers Overall transfer level: Needs assistance Equipment used: Rolling walker (2 wheeled) Transfers: Sit to/from Stand Sit to Stand: Supervision         General transfer comment: one intial VC on proper hand placement  Ambulation/Gait Ambulation/Gait assistance: Supervision Ambulation Distance (Feet): 20 Feet (x 2) Assistive device: Rolling walker (2 wheeled) Gait Pattern/deviations: Step-to pattern;Decreased stance time - left Gait velocity: decreased   General Gait Details: cues for safety,  cues for step length,to turn slowly, tends to move quickly.  amb with spouse under therapist direction   Stairs            Wheelchair Mobility    Modified Rankin (Stroke Patients Only)       Balance                                    Cognition Arousal/Alertness:  Awake/alert Behavior During Therapy: WFL for tasks assessed/performed Overall Cognitive Status: Within Functional Limits for tasks assessed                      Exercises      General Comments        Pertinent Vitals/Pain Pain Assessment: 0-10 Pain Score: 5  Pain Location: L knee Pain Descriptors / Indicators: Discomfort;Grimacing Pain Intervention(s): Limited activity within patient's tolerance;Monitored during session    Home Living                      Prior Function            PT Goals (current goals can now be found in the care plan section) Progress towards PT goals: Progressing toward goals    Frequency  7X/week    PT Plan Current plan remains appropriate    Co-evaluation             End of Session Equipment Utilized During Treatment: Left knee immobilizer Activity Tolerance: Treatment limited secondary to medical complications (Comment);Patient limited by fatigue (feels woozy) Patient left: in chair;with call bell/phone within reach     Time: 1101-1143 PT Time Calculation (min) (ACUTE ONLY): 42 min  Charges:  $Gait Training: 8-22 mins $Self Care/Home Management: 23-37                    G Codes:  Claretha Cooper 02/22/2016, 1:15 PM Tresa Endo PT [phone]

## 2016-02-22 NOTE — Discharge Summary (Signed)
Physician Discharge Summary   Patient ID: Debra Knight MRN: [id number] DOB/AGE: 1946-08-19 70 y.o.  Admit date: 02/20/2016 Discharge date: 02/22/2016  Primary Diagnosis: Primary osteoarthritis left knee   Admission Diagnoses:  Past Medical History  Diagnosis Date  . Hyperlipidemia   . Aortic stenosis     moderate on Echo 09/2013, Dr. Johnsie Cancel monitoring  . Carotid bruit     LICA 23-53% 02/1442 doppler "right"  . Osteoarthritis of both knees   . Vertigo 07/2014    8 weeks  . GERD (gastroesophageal reflux disease)   . Tinnitus   . Enlarged thyroid   . Seasonal allergies   . Epiglottitis     hx of 40 years ago  . Family history of adverse reaction to anesthesia     "father- age 31 had confusion from anesthesia"  . Heart murmur   . OSA (obstructive sleep apnea)     no cpap   . Vertigo    Discharge Diagnoses:   Principal Problem:   OA (osteoarthritis) of knee  Estimated body mass index is 43.48 kg/(m^2) as calculated from the following:   Height as of this encounter: 5' 1"  (1.549 m).   Weight as of this encounter: 104.327 kg (230 lb).  Procedure:  Procedure(s) (LRB): LEFT TOTAL KNEE ARTHROPLASTY (Left)   Consults: None  HPI: The patient has been followed for their bilateral knee pain and osteoarthritis. She recently underwent a Right TKA. Symptoms reported on the left include: pain. The patient feels that they are doing poorly. The following medication has been used for pain control: antiinflammatory medication (Aleve twice daily). Patient states that she received only inimal relief with injections in the past. Unfortunately the injections have not provided any long lasting benefit. She has pain at all times. All of this is limiting her activity. The most predictable means of improving pain and function long term would be knee replacement. She has been recovering well with regards to the right knee. They have been treated conservatively in the past for the above stated  problem and despite conservative measures, they continue to have progressive pain and severe functional limitations and dysfunction. They have failed non-operative management including home exercise, medications, and injections. It is felt that they would benefit from undergoing total joint replacement. Risks and benefits of the procedure have been discussed with the patient and they elect to proceed with surgery. There are no active contraindications to surgery such as ongoing infection or rapidly progressive neurological disease.  Laboratory Data: Admission on 02/20/2016  Component Date Value Ref Range Status  . WBC 02/21/2016 9.8  4.0 - 10.5 K/uL Final  . RBC 02/21/2016 3.71* 3.87 - 5.11 MIL/uL Final  . Hemoglobin 02/21/2016 11.1* 12.0 - 15.0 g/dL Final  . HCT 02/21/2016 33.1* 36.0 - 46.0 % Final  . MCV 02/21/2016 89.2  78.0 - 100.0 fL Final  . MCH 02/21/2016 29.9  26.0 - 34.0 pg Final  . MCHC 02/21/2016 33.5  30.0 - 36.0 g/dL Final  . RDW 02/21/2016 13.6  11.5 - 15.5 % Final  . Platelets 02/21/2016 221  150 - 400 K/uL Final  . Sodium 02/21/2016 136  135 - 145 mmol/L Final  . Potassium 02/21/2016 4.0  3.5 - 5.1 mmol/L Final  . Chloride 02/21/2016 105  101 - 111 mmol/L Final  . CO2 02/21/2016 24  22 - 32 mmol/L Final  . Glucose, Bld 02/21/2016 162* 65 - 99 mg/dL Final  . BUN 02/21/2016 11  6 - 20 mg/dL  Final  . Creatinine, Ser 02/21/2016 0.49  0.44 - 1.00 mg/dL Final  . Calcium 02/21/2016 8.4* 8.9 - 10.3 mg/dL Final  . GFR calc non Af Amer 02/21/2016 >60  >60 mL/min Final  . GFR calc Af Amer 02/21/2016 >60  >60 mL/min Final   Comment: (NOTE) The eGFR has been calculated using the CKD EPI equation. This calculation has not been validated in all clinical situations. eGFR's persistently <60 mL/min signify possible Chronic Kidney Disease.   . Anion gap 02/21/2016 7  5 - 15 Final  . WBC 02/22/2016 9.8  4.0 - 10.5 K/uL Final  . RBC 02/22/2016 3.53* 3.87 - 5.11 MIL/uL Final  . Hemoglobin  02/22/2016 10.4* 12.0 - 15.0 g/dL Final  . HCT 02/22/2016 31.4* 36.0 - 46.0 % Final  . MCV 02/22/2016 89.0  78.0 - 100.0 fL Final  . MCH 02/22/2016 29.5  26.0 - 34.0 pg Final  . MCHC 02/22/2016 33.1  30.0 - 36.0 g/dL Final  . RDW 02/22/2016 13.9  11.5 - 15.5 % Final  . Platelets 02/22/2016 224  150 - 400 K/uL Final  . Sodium 02/22/2016 136  135 - 145 mmol/L Final  . Potassium 02/22/2016 4.0  3.5 - 5.1 mmol/L Final  . Chloride 02/22/2016 104  101 - 111 mmol/L Final  . CO2 02/22/2016 26  22 - 32 mmol/L Final  . Glucose, Bld 02/22/2016 137* 65 - 99 mg/dL Final  . BUN 02/22/2016 16  6 - 20 mg/dL Final  . Creatinine, Ser 02/22/2016 0.58  0.44 - 1.00 mg/dL Final  . Calcium 02/22/2016 8.4* 8.9 - 10.3 mg/dL Final  . GFR calc non Af Amer 02/22/2016 >60  >60 mL/min Final  . GFR calc Af Amer 02/22/2016 >60  >60 mL/min Final   Comment: (NOTE) The eGFR has been calculated using the CKD EPI equation. This calculation has not been validated in all clinical situations. eGFR's persistently <60 mL/min signify possible Chronic Kidney Disease.   Debra Knight gap 02/22/2016 6  5 - 15 Final  Hospital Outpatient Visit on 02/14/2016  Component Date Value Ref Range Status  . aPTT 02/14/2016 30  24 - 37 seconds Final  . WBC 02/14/2016 7.6  4.0 - 10.5 K/uL Final  . RBC 02/14/2016 4.61  3.87 - 5.11 MIL/uL Final  . Hemoglobin 02/14/2016 13.4  12.0 - 15.0 g/dL Final  . HCT 02/14/2016 41.5  36.0 - 46.0 % Final  . MCV 02/14/2016 90.0  78.0 - 100.0 fL Final  . MCH 02/14/2016 29.1  26.0 - 34.0 pg Final  . MCHC 02/14/2016 32.3  30.0 - 36.0 g/dL Final  . RDW 02/14/2016 13.9  11.5 - 15.5 % Final  . Platelets 02/14/2016 228  150 - 400 K/uL Final  . Neutrophils Relative % 02/14/2016 63   Final  . Neutro Abs 02/14/2016 4.8  1.7 - 7.7 K/uL Final  . Lymphocytes Relative 02/14/2016 30   Final  . Lymphs Abs 02/14/2016 2.3  0.7 - 4.0 K/uL Final  . Monocytes Relative 02/14/2016 5   Final  . Monocytes Absolute 02/14/2016 0.3   0.1 - 1.0 K/uL Final  . Eosinophils Relative 02/14/2016 2   Final  . Eosinophils Absolute 02/14/2016 0.2  0.0 - 0.7 K/uL Final  . Basophils Relative 02/14/2016 0   Final  . Basophils Absolute 02/14/2016 0.0  0.0 - 0.1 K/uL Final  . Prothrombin Time 02/14/2016 13.9  11.6 - 15.2 seconds Final  . INR 02/14/2016 1.09  0.00 - 1.49 Final  .  ABO/RH(D) 02/14/2016 A POS   Final  . Antibody Screen 02/14/2016 NEG   Final  . Sample Expiration 02/14/2016 02/23/2016   Final  . Extend sample reason 02/14/2016 NO TRANSFUSIONS OR PREGNANCY IN THE PAST 3 MONTHS   Final  . Color, Urine 02/14/2016 YELLOW  YELLOW Final  . APPearance 02/14/2016 CLOUDY* CLEAR Final  . Specific Gravity, Urine 02/14/2016 1.011  1.005 - 1.030 Final  . pH 02/14/2016 5.0  5.0 - 8.0 Final  . Glucose, UA 02/14/2016 NEGATIVE  NEGATIVE mg/dL Final  . Hgb urine dipstick 02/14/2016 TRACE* NEGATIVE Final  . Bilirubin Urine 02/14/2016 NEGATIVE  NEGATIVE Final  . Ketones, ur 02/14/2016 NEGATIVE  NEGATIVE mg/dL Final  . Protein, ur 02/14/2016 NEGATIVE  NEGATIVE mg/dL Final  . Nitrite 02/14/2016 NEGATIVE  NEGATIVE Final  . Leukocytes, UA 02/14/2016 MODERATE* NEGATIVE Final  . MRSA, PCR 02/14/2016 NEGATIVE  NEGATIVE Final  . Staphylococcus aureus 02/14/2016 POSITIVE* NEGATIVE Final   Comment:        The Xpert SA Assay (FDA approved for NASAL specimens in patients over 3 years of age), is one component of a comprehensive surveillance program.  Test performance has been validated by [facility] for patients greater than or equal to 68 year old. It is not intended to diagnose infection nor to guide or monitor treatment.   . Sodium 02/14/2016 139  135 - 145 mmol/L Final  . Potassium 02/14/2016 4.5  3.5 - 5.1 mmol/L Final  . Chloride 02/14/2016 105  101 - 111 mmol/L Final  . CO2 02/14/2016 27  22 - 32 mmol/L Final  . Glucose, Bld 02/14/2016 98  65 - 99 mg/dL Final  . BUN 02/14/2016 15  6 - 20 mg/dL Final  . Creatinine, Ser 02/14/2016  0.55  0.44 - 1.00 mg/dL Final  . Calcium 02/14/2016 9.0  8.9 - 10.3 mg/dL Final  . GFR calc non Af Amer 02/14/2016 >60  >60 mL/min Final  . GFR calc Af Amer 02/14/2016 >60  >60 mL/min Final   Comment: (NOTE) The eGFR has been calculated using the CKD EPI equation. This calculation has not been validated in all clinical situations. eGFR's persistently <60 mL/min signify possible Chronic Kidney Disease.   . Anion gap 02/14/2016 7  5 - 15 Final  . Squamous Epithelial / LPF 02/14/2016 0-5* NONE SEEN Final  . WBC, UA 02/14/2016 0-5  0 - 5 WBC/hpf Final  . RBC / HPF 02/14/2016 0-5  0 - 5 RBC/hpf Final  . Bacteria, UA 02/14/2016 RARE* NONE SEEN Final     X-Rays:Dg Chest 2 View  02/14/2016  CLINICAL DATA:  Preop for knee arthroplasty EXAM: CHEST  2 VIEW COMPARISON:  None. FINDINGS: Cardiomediastinal silhouette is unremarkable. No acute infiltrate or pleural effusion. No pulmonary edema. Mild degenerative changes mid and lower thoracic spine. IMPRESSION: No active cardiopulmonary disease. Electronically Signed   By: Lahoma Crocker M.D.   On: 02/14/2016 16:23    EKG: Orders placed or performed in visit on 09/15/15  . EKG 12-Lead     Hospital Course: Debra Knight is a 70 y.o. who was admitted to [facility]. They were brought to the operating room on 02/20/2016 and underwent Procedure(s): LEFT TOTAL KNEE ARTHROPLASTY.  Patient tolerated the procedure well and was later transferred to the recovery room and then to the orthopaedic floor for postoperative care.  They were given PO and IV analgesics for pain control following their surgery.  They were given 24 hours of postoperative antibiotics  of  Anti-infectives    Start     Dose/Rate Route Frequency Ordered Stop   02/21/16 0030  vancomycin (VANCOCIN) IVPB 1000 mg/200 mL premix     1,000 mg 200 mL/hr over 60 Minutes Intravenous Every 12 hours 02/20/16 1441 02/21/16 0119   02/20/16 0600  vancomycin (VANCOCIN) 1,500 mg in sodium chloride 0.9  % 500 mL IVPB     1,500 mg 250 mL/hr over 120 Minutes Intravenous 60 min pre-op 02/19/16 1104 02/20/16 1256     and started on DVT prophylaxis in the form of Xarelto.   PT and OT were ordered for total joint protocol.  Discharge planning consulted to help with postop disposition and equipment needs.  Patient had a good night on the evening of surgery.  They started to get up OOB with therapy on day one. Hemovac drain was pulled without difficulty.  Continued to work with therapy into day two.  Dressing was changed on day two and the incision was clean and dry. She has had some increased discomfort from the CPM so it was discontinued. The patient had progressed with therapy and meeting their goals.  Incision was healing well.  Patient was seen in rounds and was ready to go home.   Diet: Cardiac diet Activity:WBAT Follow-up:in 2 weeks Disposition - Home Discharged Condition: stable   Discharge Instructions    Call MD / Call 911    Complete by:  As directed   If you experience chest pain or shortness of breath, CALL 911 and be transported to the hospital emergency room.  If you develope a fever above 101 F, pus (white drainage) or increased drainage or redness at the wound, or calf pain, call your surgeon's office.     Constipation Prevention    Complete by:  As directed   Drink plenty of fluids.  Prune juice may be helpful.  You may use a stool softener, such as Colace (over the counter) 100 mg twice a day.  Use MiraLax (over the counter) for constipation as needed.     Diet - low sodium heart healthy    Complete by:  As directed      Discharge instructions    Complete by:  As directed   Dr. Gaynelle Arabian Total Joint Specialist [facility] [address], [place], [place] [address] [phone]  TOTAL KNEE REPLACEMENT POSTOPERATIVE DIRECTIONS  Knee Rehabilitation, Guidelines Following Surgery  Results after knee surgery are often greatly improved when you follow  the exercise, range of motion and muscle strengthening exercises prescribed by your doctor. Safety measures are also important to protect the knee from further injury. Any time any of these exercises cause you to have increased pain or swelling in your knee joint, decrease the amount until you are comfortable again and slowly increase them. If you have problems or questions, call your caregiver or physical therapist for advice.   HOME CARE INSTRUCTIONS  Remove items at home which could result in a fall. This includes throw rugs or furniture in walking pathways.  ICE to the affected knee every three hours for 30 minutes at a time and then as needed for pain and swelling.  Continue to use ice on the knee for pain and swelling from surgery. You may notice swelling that will progress down to the foot and ankle.  This is normal after surgery.  Elevate the leg when you are not up walking on it.   Continue to use the breathing machine which will  help keep your temperature down.  It is common for your temperature to cycle up and down following surgery, especially at night when you are not up moving around and exerting yourself.  The breathing machine keeps your lungs expanded and your temperature down. Do not place pillow under knee, focus on keeping the knee straight while resting  DIET You may resume your previous home diet once your are discharged from the hospital.  DRESSING / WOUND CARE / SHOWERING You may start showering once you are discharged home but do not submerge the incision under water. Just pat the incision dry and apply a dry gauze dressing on daily. Change the surgical dressing daily and reapply a dry dressing each time.  ACTIVITY Walk with your walker as instructed. Use walker as long as suggested by your caregivers. Avoid periods of inactivity such as sitting longer than an hour when not asleep. This helps prevent blood clots.  You may resume a sexual relationship in one month or when  given the OK by your doctor.  You may return to work once you are cleared by your doctor.  Do not drive a car for 6 weeks or until released by you surgeon.  Do not drive while taking narcotics.  WEIGHT BEARING Weight bearing as tolerated with assist device (walker, cane, etc) as directed, use it as long as suggested by your surgeon or therapist, typically at least 4-6 weeks.  POSTOPERATIVE CONSTIPATION PROTOCOL Constipation - defined medically as fewer than three stools per week and severe constipation as less than one stool per week.  One of the most common issues patients have following surgery is constipation.  Even if you have a regular bowel pattern at home, your normal regimen is likely to be disrupted due to multiple reasons following surgery.  Combination of anesthesia, postoperative narcotics, change in appetite and fluid intake all can affect your bowels.  In order to avoid complications following surgery, here are some recommendations in order to help you during your recovery period.  Colace (docusate) - Pick up an over-the-counter form of Colace or another stool softener and take twice a day as long as you are requiring postoperative pain medications.  Take with a full glass of water daily.  If you experience loose stools or diarrhea, hold the colace until you stool forms back up.  If your symptoms do not get better within 1 week or if they get worse, check with your doctor.  Dulcolax (bisacodyl) - Pick up over-the-counter and take as directed by the product packaging as needed to assist with the movement of your bowels.  Take with a full glass of water.  Use this product as needed if not relieved by Colace only.   MiraLax (polyethylene glycol) - Pick up over-the-counter to have on hand.  MiraLax is a solution that will increase the amount of water in your bowels to assist with bowel movements.  Take as directed and can mix with a glass of water, juice, soda, coffee, or tea.  Take if you  go more than two days without a movement. Do not use MiraLax more than once per day. Call your doctor if you are still constipated or irregular after using this medication for 7 days in a row.  If you continue to have problems with postoperative constipation, please contact the office for further assistance and recommendations.  If you experience "the worst abdominal pain ever" or develop nausea or vomiting, please contact the office immediatly for further recommendations for treatment.  ITCHING  If you experience itching with your medications, try taking only a single pain pill, or even half a pain pill at a time.  You can also use Benadryl over the counter for itching or also to help with sleep.   TED HOSE STOCKINGS Wear the elastic stockings on both legs for three weeks following surgery during the day but you may remove then at night for sleeping.  MEDICATIONS See your medication summary on the "After Visit Summary" that the nursing staff will review with you prior to discharge.  You may have some home medications which will be placed on hold until you complete the course of blood thinner medication.  It is important for you to complete the blood thinner medication as prescribed by your surgeon.  Continue your approved medications as instructed at time of discharge.  PRECAUTIONS If you experience chest pain or shortness of breath - call 911 immediately for transfer to the hospital emergency department.  If you develop a fever greater that 101 F, purulent drainage from wound, increased redness or drainage from wound, foul odor from the wound/dressing, or calf pain - CONTACT YOUR SURGEON.                                                   FOLLOW-UP APPOINTMENTS Make sure you keep all of your appointments after your operation with your surgeon and caregivers. You should call the office at the above phone number and make an appointment for approximately two weeks after the date of your surgery or  on the date instructed by your surgeon outlined in the "After Visit Summary".   RANGE OF MOTION AND STRENGTHENING EXERCISES  Rehabilitation of the knee is important following a knee injury or an operation. After just a few days of immobilization, the muscles of the thigh which control the knee become weakened and shrink (atrophy). Knee exercises are designed to build up the tone and strength of the thigh muscles and to improve knee motion. Often times heat used for twenty to thirty minutes before working out will loosen up your tissues and help with improving the range of motion but do not use heat for the first two weeks following surgery. These exercises can be done on a training (exercise) mat, on the floor, on a table or on a bed. Use what ever works the best and is most comfortable for you Knee exercises include:  Leg Lifts - While your knee is still immobilized in a splint or cast, you can do straight leg raises. Lift the leg to 60 degrees, hold for 3 sec, and slowly lower the leg. Repeat 10-20 times 2-3 times daily. Perform this exercise against resistance later as your knee gets better.  Quad and Hamstring Sets - Tighten up the muscle on the front of the thigh (Quad) and hold for 5-10 sec. Repeat this 10-20 times hourly. Hamstring sets are done by pushing the foot backward against an object and holding for 5-10 sec. Repeat as with quad sets.  Leg Slides: Lying on your back, slowly slide your foot toward your buttocks, bending your knee up off the floor (only go as far as is comfortable). Then slowly slide your foot back down until your leg is flat on the floor again. Angel Wings: Lying on your back spread your legs to the side as far apart  as you can without causing discomfort.  A rehabilitation program following serious knee injuries can speed recovery and prevent re-injury in the future due to weakened muscles. Contact your doctor or a physical therapist for more information on knee  rehabilitation.   IF YOU ARE TRANSFERRED TO A SKILLED REHAB FACILITY If the patient is transferred to a skilled rehab facility following release from the hospital, a list of the current medications will be sent to the facility for the patient to continue.  When discharged from the skilled rehab facility, please have the facility set up the patient's [place] prior to being released. Also, the skilled facility will be responsible for providing the patient with their medications at time of release from the facility to include their pain medication, the muscle relaxants, and their blood thinner medication. If the patient is still at the rehab facility at time of the two week follow up appointment, the skilled rehab facility will also need to assist the patient in arranging follow up appointment in our office and any transportation needs.  MAKE SURE YOU:  Understand these instructions.  Get help right away if you are not doing well or get worse.    Pick up stool softner and laxative for home use following surgery while on pain medications. Do not submerge incision under water. Please use good hand washing techniques while changing dressing each day. May shower starting three days after surgery. Please use a clean towel to pat the incision dry following showers. Continue to use ice for pain and swelling after surgery. Do not use any lotions or creams on the incision until instructed by your surgeon.     Increase activity slowly as tolerated    Complete by:  As directed             Medication List    STOP taking these medications        ALEVE 220 MG tablet  Generic drug:  naproxen sodium     aspirin 81 MG chewable tablet     GLUCOSAMINE CHONDROITIN JOINT PO     HAIR/SKIN/NAILS PO     multivitamin with minerals Tabs tablet      TAKE these medications        atorvastatin 40 MG tablet  Commonly known as:  LIPITOR  Take 1 tablet (40 mg total) by mouth daily.       methocarbamol 500 MG tablet  Commonly known as:  ROBAXIN  Take 1 tablet (500 mg total) by mouth every 6 (six) hours as needed for muscle spasms.     oxyCODONE 5 MG immediate release tablet  Commonly known as:  Oxy IR/ROXICODONE  Take 1-2 tablets (5-10 mg total) by mouth every 3 (three) hours as needed for breakthrough pain.     ranitidine 150 MG tablet  Commonly known as:  ZANTAC  Take 150 mg by mouth 2 (two) times daily.     rivaroxaban 10 MG Tabs tablet  Commonly known as:  XARELTO  Take 1 tablet (10 mg total) by mouth daily with breakfast.     traMADol 50 MG tablet  Commonly known as:  ULTRAM  Take 1-2 tablets (50-100 mg total) by mouth every 6 (six) hours as needed for moderate pain.           Follow-up Information    Follow up with [name], MD. Schedule an appointment as soon as possible for a visit on 03/08/2016.   Specialty:  Orthopedic Surgery   Contact  information:   [address] [place] [address] [phone]       Follow up with [facility].   Why:  Junie Panning has been requested as your physical therapist   Contact information:   [place]  [address] [phone]       Signed: Ardeen Jourdain, PA-C Orthopaedic Surgery 02/22/2016, 8:57 AM

## 2016-02-23 DIAGNOSIS — Z96653 Presence of artificial knee joint, bilateral: Secondary | ICD-10-CM | POA: Diagnosis not present

## 2016-02-23 DIAGNOSIS — M1990 Unspecified osteoarthritis, unspecified site: Secondary | ICD-10-CM | POA: Diagnosis not present

## 2016-02-23 DIAGNOSIS — I10 Essential (primary) hypertension: Secondary | ICD-10-CM | POA: Diagnosis not present

## 2016-02-23 DIAGNOSIS — Z471 Aftercare following joint replacement surgery: Secondary | ICD-10-CM | POA: Diagnosis not present

## 2016-02-23 DIAGNOSIS — R42 Dizziness and giddiness: Secondary | ICD-10-CM | POA: Diagnosis not present

## 2016-02-23 DIAGNOSIS — I350 Nonrheumatic aortic (valve) stenosis: Secondary | ICD-10-CM | POA: Diagnosis not present

## 2016-02-24 DIAGNOSIS — I10 Essential (primary) hypertension: Secondary | ICD-10-CM | POA: Diagnosis not present

## 2016-02-24 DIAGNOSIS — M1990 Unspecified osteoarthritis, unspecified site: Secondary | ICD-10-CM | POA: Diagnosis not present

## 2016-02-24 DIAGNOSIS — I350 Nonrheumatic aortic (valve) stenosis: Secondary | ICD-10-CM | POA: Diagnosis not present

## 2016-02-24 DIAGNOSIS — R42 Dizziness and giddiness: Secondary | ICD-10-CM | POA: Diagnosis not present

## 2016-02-24 DIAGNOSIS — Z471 Aftercare following joint replacement surgery: Secondary | ICD-10-CM | POA: Diagnosis not present

## 2016-02-24 DIAGNOSIS — Z96653 Presence of artificial knee joint, bilateral: Secondary | ICD-10-CM | POA: Diagnosis not present

## 2016-02-27 DIAGNOSIS — R42 Dizziness and giddiness: Secondary | ICD-10-CM | POA: Diagnosis not present

## 2016-02-27 DIAGNOSIS — I350 Nonrheumatic aortic (valve) stenosis: Secondary | ICD-10-CM | POA: Diagnosis not present

## 2016-02-27 DIAGNOSIS — Z471 Aftercare following joint replacement surgery: Secondary | ICD-10-CM | POA: Diagnosis not present

## 2016-02-27 DIAGNOSIS — I10 Essential (primary) hypertension: Secondary | ICD-10-CM | POA: Diagnosis not present

## 2016-02-27 DIAGNOSIS — Z96653 Presence of artificial knee joint, bilateral: Secondary | ICD-10-CM | POA: Diagnosis not present

## 2016-02-27 DIAGNOSIS — M1990 Unspecified osteoarthritis, unspecified site: Secondary | ICD-10-CM | POA: Diagnosis not present

## 2016-02-29 DIAGNOSIS — I10 Essential (primary) hypertension: Secondary | ICD-10-CM | POA: Diagnosis not present

## 2016-02-29 DIAGNOSIS — M1990 Unspecified osteoarthritis, unspecified site: Secondary | ICD-10-CM | POA: Diagnosis not present

## 2016-02-29 DIAGNOSIS — Z471 Aftercare following joint replacement surgery: Secondary | ICD-10-CM | POA: Diagnosis not present

## 2016-02-29 DIAGNOSIS — R42 Dizziness and giddiness: Secondary | ICD-10-CM | POA: Diagnosis not present

## 2016-02-29 DIAGNOSIS — Z96653 Presence of artificial knee joint, bilateral: Secondary | ICD-10-CM | POA: Diagnosis not present

## 2016-02-29 DIAGNOSIS — I350 Nonrheumatic aortic (valve) stenosis: Secondary | ICD-10-CM | POA: Diagnosis not present

## 2016-03-01 DIAGNOSIS — M1990 Unspecified osteoarthritis, unspecified site: Secondary | ICD-10-CM | POA: Diagnosis not present

## 2016-03-01 DIAGNOSIS — R42 Dizziness and giddiness: Secondary | ICD-10-CM | POA: Diagnosis not present

## 2016-03-01 DIAGNOSIS — Z96653 Presence of artificial knee joint, bilateral: Secondary | ICD-10-CM | POA: Diagnosis not present

## 2016-03-01 DIAGNOSIS — I10 Essential (primary) hypertension: Secondary | ICD-10-CM | POA: Diagnosis not present

## 2016-03-01 DIAGNOSIS — I350 Nonrheumatic aortic (valve) stenosis: Secondary | ICD-10-CM | POA: Diagnosis not present

## 2016-03-01 DIAGNOSIS — Z471 Aftercare following joint replacement surgery: Secondary | ICD-10-CM | POA: Diagnosis not present

## 2016-03-02 DIAGNOSIS — M25562 Pain in left knee: Secondary | ICD-10-CM | POA: Diagnosis not present

## 2016-03-02 DIAGNOSIS — R29898 Other symptoms and signs involving the musculoskeletal system: Secondary | ICD-10-CM | POA: Diagnosis not present

## 2016-03-02 DIAGNOSIS — M25662 Stiffness of left knee, not elsewhere classified: Secondary | ICD-10-CM | POA: Diagnosis not present

## 2016-03-02 DIAGNOSIS — G8929 Other chronic pain: Secondary | ICD-10-CM | POA: Diagnosis not present

## 2016-03-02 DIAGNOSIS — R269 Unspecified abnormalities of gait and mobility: Secondary | ICD-10-CM | POA: Diagnosis not present

## 2016-03-02 DIAGNOSIS — Z96652 Presence of left artificial knee joint: Secondary | ICD-10-CM | POA: Diagnosis not present

## 2016-03-05 DIAGNOSIS — M25562 Pain in left knee: Secondary | ICD-10-CM | POA: Diagnosis not present

## 2016-03-05 DIAGNOSIS — R29898 Other symptoms and signs involving the musculoskeletal system: Secondary | ICD-10-CM | POA: Diagnosis not present

## 2016-03-05 DIAGNOSIS — Z96652 Presence of left artificial knee joint: Secondary | ICD-10-CM | POA: Diagnosis not present

## 2016-03-05 DIAGNOSIS — R269 Unspecified abnormalities of gait and mobility: Secondary | ICD-10-CM | POA: Diagnosis not present

## 2016-03-05 DIAGNOSIS — G8929 Other chronic pain: Secondary | ICD-10-CM | POA: Diagnosis not present

## 2016-03-05 DIAGNOSIS — M25662 Stiffness of left knee, not elsewhere classified: Secondary | ICD-10-CM | POA: Diagnosis not present

## 2016-03-07 DIAGNOSIS — R29898 Other symptoms and signs involving the musculoskeletal system: Secondary | ICD-10-CM | POA: Diagnosis not present

## 2016-03-07 DIAGNOSIS — R269 Unspecified abnormalities of gait and mobility: Secondary | ICD-10-CM | POA: Diagnosis not present

## 2016-03-07 DIAGNOSIS — M25562 Pain in left knee: Secondary | ICD-10-CM | POA: Diagnosis not present

## 2016-03-07 DIAGNOSIS — Z96652 Presence of left artificial knee joint: Secondary | ICD-10-CM | POA: Diagnosis not present

## 2016-03-07 DIAGNOSIS — G8929 Other chronic pain: Secondary | ICD-10-CM | POA: Diagnosis not present

## 2016-03-07 DIAGNOSIS — M25662 Stiffness of left knee, not elsewhere classified: Secondary | ICD-10-CM | POA: Diagnosis not present

## 2016-03-08 DIAGNOSIS — Z471 Aftercare following joint replacement surgery: Secondary | ICD-10-CM | POA: Diagnosis not present

## 2016-03-08 DIAGNOSIS — Z96652 Presence of left artificial knee joint: Secondary | ICD-10-CM | POA: Diagnosis not present

## 2016-03-09 DIAGNOSIS — M25662 Stiffness of left knee, not elsewhere classified: Secondary | ICD-10-CM | POA: Diagnosis not present

## 2016-03-09 DIAGNOSIS — R29898 Other symptoms and signs involving the musculoskeletal system: Secondary | ICD-10-CM | POA: Diagnosis not present

## 2016-03-09 DIAGNOSIS — M25562 Pain in left knee: Secondary | ICD-10-CM | POA: Diagnosis not present

## 2016-03-09 DIAGNOSIS — R269 Unspecified abnormalities of gait and mobility: Secondary | ICD-10-CM | POA: Diagnosis not present

## 2016-03-09 DIAGNOSIS — G8929 Other chronic pain: Secondary | ICD-10-CM | POA: Diagnosis not present

## 2016-03-09 DIAGNOSIS — Z96652 Presence of left artificial knee joint: Secondary | ICD-10-CM | POA: Diagnosis not present

## 2016-03-14 DIAGNOSIS — R269 Unspecified abnormalities of gait and mobility: Secondary | ICD-10-CM | POA: Diagnosis not present

## 2016-03-14 DIAGNOSIS — R29898 Other symptoms and signs involving the musculoskeletal system: Secondary | ICD-10-CM | POA: Diagnosis not present

## 2016-03-14 DIAGNOSIS — G8929 Other chronic pain: Secondary | ICD-10-CM | POA: Diagnosis not present

## 2016-03-14 DIAGNOSIS — M25562 Pain in left knee: Secondary | ICD-10-CM | POA: Diagnosis not present

## 2016-03-14 DIAGNOSIS — Z96652 Presence of left artificial knee joint: Secondary | ICD-10-CM | POA: Diagnosis not present

## 2016-03-14 DIAGNOSIS — M25662 Stiffness of left knee, not elsewhere classified: Secondary | ICD-10-CM | POA: Diagnosis not present

## 2016-03-16 DIAGNOSIS — Z96652 Presence of left artificial knee joint: Secondary | ICD-10-CM | POA: Diagnosis not present

## 2016-03-16 DIAGNOSIS — G8929 Other chronic pain: Secondary | ICD-10-CM | POA: Diagnosis not present

## 2016-03-16 DIAGNOSIS — R269 Unspecified abnormalities of gait and mobility: Secondary | ICD-10-CM | POA: Diagnosis not present

## 2016-03-16 DIAGNOSIS — M25662 Stiffness of left knee, not elsewhere classified: Secondary | ICD-10-CM | POA: Diagnosis not present

## 2016-03-16 DIAGNOSIS — R29898 Other symptoms and signs involving the musculoskeletal system: Secondary | ICD-10-CM | POA: Diagnosis not present

## 2016-03-16 DIAGNOSIS — M25562 Pain in left knee: Secondary | ICD-10-CM | POA: Diagnosis not present

## 2016-03-21 DIAGNOSIS — M25562 Pain in left knee: Secondary | ICD-10-CM | POA: Diagnosis not present

## 2016-03-21 DIAGNOSIS — R269 Unspecified abnormalities of gait and mobility: Secondary | ICD-10-CM | POA: Diagnosis not present

## 2016-03-21 DIAGNOSIS — Z96652 Presence of left artificial knee joint: Secondary | ICD-10-CM | POA: Diagnosis not present

## 2016-03-21 DIAGNOSIS — G8929 Other chronic pain: Secondary | ICD-10-CM | POA: Diagnosis not present

## 2016-03-21 DIAGNOSIS — R29898 Other symptoms and signs involving the musculoskeletal system: Secondary | ICD-10-CM | POA: Diagnosis not present

## 2016-03-21 DIAGNOSIS — M25662 Stiffness of left knee, not elsewhere classified: Secondary | ICD-10-CM | POA: Diagnosis not present

## 2016-03-23 DIAGNOSIS — M25562 Pain in left knee: Secondary | ICD-10-CM | POA: Diagnosis not present

## 2016-03-23 DIAGNOSIS — M25662 Stiffness of left knee, not elsewhere classified: Secondary | ICD-10-CM | POA: Diagnosis not present

## 2016-03-23 DIAGNOSIS — R269 Unspecified abnormalities of gait and mobility: Secondary | ICD-10-CM | POA: Diagnosis not present

## 2016-03-23 DIAGNOSIS — R29898 Other symptoms and signs involving the musculoskeletal system: Secondary | ICD-10-CM | POA: Diagnosis not present

## 2016-03-23 DIAGNOSIS — Z96652 Presence of left artificial knee joint: Secondary | ICD-10-CM | POA: Diagnosis not present

## 2016-03-23 DIAGNOSIS — G8929 Other chronic pain: Secondary | ICD-10-CM | POA: Diagnosis not present

## 2016-03-27 DIAGNOSIS — M25662 Stiffness of left knee, not elsewhere classified: Secondary | ICD-10-CM | POA: Diagnosis not present

## 2016-03-27 DIAGNOSIS — R29898 Other symptoms and signs involving the musculoskeletal system: Secondary | ICD-10-CM | POA: Diagnosis not present

## 2016-03-27 DIAGNOSIS — R269 Unspecified abnormalities of gait and mobility: Secondary | ICD-10-CM | POA: Diagnosis not present

## 2016-03-27 DIAGNOSIS — G8929 Other chronic pain: Secondary | ICD-10-CM | POA: Diagnosis not present

## 2016-03-27 DIAGNOSIS — Z96652 Presence of left artificial knee joint: Secondary | ICD-10-CM | POA: Diagnosis not present

## 2016-03-27 DIAGNOSIS — M25562 Pain in left knee: Secondary | ICD-10-CM | POA: Diagnosis not present

## 2016-03-29 DIAGNOSIS — Z96652 Presence of left artificial knee joint: Secondary | ICD-10-CM | POA: Diagnosis not present

## 2016-03-29 DIAGNOSIS — Z471 Aftercare following joint replacement surgery: Secondary | ICD-10-CM | POA: Diagnosis not present

## 2016-04-09 DIAGNOSIS — G8929 Other chronic pain: Secondary | ICD-10-CM | POA: Diagnosis not present

## 2016-04-09 DIAGNOSIS — Z96652 Presence of left artificial knee joint: Secondary | ICD-10-CM | POA: Diagnosis not present

## 2016-04-09 DIAGNOSIS — R29898 Other symptoms and signs involving the musculoskeletal system: Secondary | ICD-10-CM | POA: Diagnosis not present

## 2016-04-09 DIAGNOSIS — M25562 Pain in left knee: Secondary | ICD-10-CM | POA: Diagnosis not present

## 2016-04-09 DIAGNOSIS — M25662 Stiffness of left knee, not elsewhere classified: Secondary | ICD-10-CM | POA: Diagnosis not present

## 2016-04-09 DIAGNOSIS — R269 Unspecified abnormalities of gait and mobility: Secondary | ICD-10-CM | POA: Diagnosis not present

## 2016-04-12 DIAGNOSIS — R269 Unspecified abnormalities of gait and mobility: Secondary | ICD-10-CM | POA: Diagnosis not present

## 2016-04-12 DIAGNOSIS — G8929 Other chronic pain: Secondary | ICD-10-CM | POA: Diagnosis not present

## 2016-04-12 DIAGNOSIS — M25662 Stiffness of left knee, not elsewhere classified: Secondary | ICD-10-CM | POA: Diagnosis not present

## 2016-04-12 DIAGNOSIS — R29898 Other symptoms and signs involving the musculoskeletal system: Secondary | ICD-10-CM | POA: Diagnosis not present

## 2016-04-12 DIAGNOSIS — Z96652 Presence of left artificial knee joint: Secondary | ICD-10-CM | POA: Diagnosis not present

## 2016-04-12 DIAGNOSIS — M25562 Pain in left knee: Secondary | ICD-10-CM | POA: Diagnosis not present

## 2016-04-18 DIAGNOSIS — R29898 Other symptoms and signs involving the musculoskeletal system: Secondary | ICD-10-CM | POA: Diagnosis not present

## 2016-04-18 DIAGNOSIS — M25662 Stiffness of left knee, not elsewhere classified: Secondary | ICD-10-CM | POA: Diagnosis not present

## 2016-04-18 DIAGNOSIS — Z96652 Presence of left artificial knee joint: Secondary | ICD-10-CM | POA: Diagnosis not present

## 2016-04-18 DIAGNOSIS — G8929 Other chronic pain: Secondary | ICD-10-CM | POA: Diagnosis not present

## 2016-04-18 DIAGNOSIS — R269 Unspecified abnormalities of gait and mobility: Secondary | ICD-10-CM | POA: Diagnosis not present

## 2016-04-18 DIAGNOSIS — M25562 Pain in left knee: Secondary | ICD-10-CM | POA: Diagnosis not present

## 2016-04-20 DIAGNOSIS — M25662 Stiffness of left knee, not elsewhere classified: Secondary | ICD-10-CM | POA: Diagnosis not present

## 2016-04-20 DIAGNOSIS — Z96652 Presence of left artificial knee joint: Secondary | ICD-10-CM | POA: Diagnosis not present

## 2016-04-20 DIAGNOSIS — R269 Unspecified abnormalities of gait and mobility: Secondary | ICD-10-CM | POA: Diagnosis not present

## 2016-04-20 DIAGNOSIS — G8929 Other chronic pain: Secondary | ICD-10-CM | POA: Diagnosis not present

## 2016-04-20 DIAGNOSIS — M25562 Pain in left knee: Secondary | ICD-10-CM | POA: Diagnosis not present

## 2016-04-20 DIAGNOSIS — R29898 Other symptoms and signs involving the musculoskeletal system: Secondary | ICD-10-CM | POA: Diagnosis not present

## 2016-05-05 DIAGNOSIS — Z23 Encounter for immunization: Secondary | ICD-10-CM | POA: Diagnosis not present

## 2016-05-08 DIAGNOSIS — Z96652 Presence of left artificial knee joint: Secondary | ICD-10-CM | POA: Diagnosis not present

## 2016-05-08 DIAGNOSIS — Z471 Aftercare following joint replacement surgery: Secondary | ICD-10-CM | POA: Diagnosis not present

## 2016-07-25 ENCOUNTER — Ambulatory Visit: Payer: Medicare Other | Admitting: Cardiovascular Disease

## 2016-08-17 DIAGNOSIS — M25551 Pain in right hip: Secondary | ICD-10-CM | POA: Diagnosis not present

## 2016-08-17 DIAGNOSIS — S76011A Strain of muscle, fascia and tendon of right hip, initial encounter: Secondary | ICD-10-CM | POA: Diagnosis not present

## 2016-08-17 DIAGNOSIS — M7662 Achilles tendinitis, left leg: Secondary | ICD-10-CM | POA: Diagnosis not present

## 2016-08-24 ENCOUNTER — Other Ambulatory Visit: Payer: Self-pay | Admitting: Cardiovascular Disease
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
## 2016-09-05 ENCOUNTER — Encounter (INDEPENDENT_AMBULATORY_CARE_PROVIDER_SITE_OTHER): Payer: Self-pay

## 2016-09-05 ENCOUNTER — Ambulatory Visit (INDEPENDENT_AMBULATORY_CARE_PROVIDER_SITE_OTHER): Payer: Medicare Other | Admitting: Cardiovascular Disease

## 2016-09-05 VITALS — BP 130/70 | HR 75 | Ht 61.0 in | Wt 232.0 lb

## 2016-09-05 DIAGNOSIS — I350 Nonrheumatic aortic (valve) stenosis: Secondary | ICD-10-CM

## 2016-09-05 DIAGNOSIS — I10 Essential (primary) hypertension: Secondary | ICD-10-CM | POA: Diagnosis not present

## 2016-09-05 NOTE — Patient Instructions (Addendum)
Medication Instructions:  Your physician recommends that you continue on your current medications as directed. Please refer to the Current Medication list given to you today.  Labwork: NONE  Testing/Procedures: Your physician has requested that you have an echocardiogram in March on same day as office appt. Echocardiography is a painless test that uses sound waves to create images of your heart. It provides your doctor with information about the size and shape of your heart and how well your heart's chambers and valves are working. This procedure takes approximately one hour. There are no restrictions for this procedure.  Follow-Up: Your physician wants you to follow-up in: 3 months with Dr. Johnsie Cancel.    If you need a refill on your cardiac medications before your next appointment, please call your pharmacy.

## 2016-10-09 DIAGNOSIS — Z471 Aftercare following joint replacement surgery: Secondary | ICD-10-CM | POA: Diagnosis not present

## 2016-10-09 DIAGNOSIS — Z96652 Presence of left artificial knee joint: Secondary | ICD-10-CM | POA: Diagnosis not present

## 2016-10-11 ENCOUNTER — Ambulatory Visit: Payer: Medicare Other | Admitting: Cardiovascular Disease

## 2016-10-19 DIAGNOSIS — Z23 Encounter for immunization: Secondary | ICD-10-CM | POA: Diagnosis not present

## 2016-11-12 ENCOUNTER — Other Ambulatory Visit (HOSPITAL_COMMUNITY): Payer: Medicare Other

## 2016-11-23 DIAGNOSIS — M7662 Achilles tendinitis, left leg: Secondary | ICD-10-CM | POA: Diagnosis not present

## 2016-11-23 DIAGNOSIS — Z96652 Presence of left artificial knee joint: Secondary | ICD-10-CM | POA: Diagnosis not present

## 2016-11-23 DIAGNOSIS — Z471 Aftercare following joint replacement surgery: Secondary | ICD-10-CM | POA: Diagnosis not present

## 2016-12-22 DIAGNOSIS — J069 Acute upper respiratory infection, unspecified: Secondary | ICD-10-CM | POA: Diagnosis not present

## 2016-12-22 DIAGNOSIS — B9789 Other viral agents as the cause of diseases classified elsewhere: Secondary | ICD-10-CM | POA: Diagnosis not present

## 2017-01-08 ENCOUNTER — Ambulatory Visit (HOSPITAL_COMMUNITY): Payer: Medicare Other | Attending: Cardiovascular Disease

## 2017-01-08 ENCOUNTER — Other Ambulatory Visit: Payer: Self-pay

## 2017-01-08 DIAGNOSIS — I361 Nonrheumatic tricuspid (valve) insufficiency: Secondary | ICD-10-CM | POA: Insufficient documentation

## 2017-01-08 DIAGNOSIS — I352 Nonrheumatic aortic (valve) stenosis with insufficiency: Secondary | ICD-10-CM | POA: Insufficient documentation

## 2017-01-08 DIAGNOSIS — I350 Nonrheumatic aortic (valve) stenosis: Secondary | ICD-10-CM

## 2017-01-08 DIAGNOSIS — I10 Essential (primary) hypertension: Secondary | ICD-10-CM | POA: Diagnosis not present

## 2017-01-08 DIAGNOSIS — I119 Hypertensive heart disease without heart failure: Secondary | ICD-10-CM | POA: Insufficient documentation

## 2017-01-08 DIAGNOSIS — I348 Other nonrheumatic mitral valve disorders: Secondary | ICD-10-CM | POA: Insufficient documentation

## 2017-01-09 ENCOUNTER — Telehealth: Payer: Self-pay | Admitting: Cardiovascular Disease

## 2017-01-09 NOTE — Telephone Encounter (Signed)
Walk in pt form-pt is having dental work, pls call about antibiotics. Placed in Dr. Johnsie Cancel box

## 2017-01-10 ENCOUNTER — Encounter: Payer: Self-pay | Admitting: Cardiovascular Disease

## 2017-01-10 NOTE — Progress Notes (Signed)
Patient ID: LAKISHA PEYSER, female   DOB: March 11, 1946, 71 y.o.   MRN: [id number]   71 y.o.  from [place]. Relocated here with husband. Here to discuss need for heart cath and AVR.   Echo 11/02/15  moderate AS mean gradient 59mmHg peak 58 mmHg reviewed: Echo 01/08/17 severe AS mean gradient 43 mmHg peak 81 mmHg mild to moderate AR  Study Conclusions  - Left ventricle: The cavity size was normal. There was severe   focal basal and moderate concentric hypertrophy of the septum   with otherwise mild concentric hypertrophy. Systolic function was   normal. The estimated ejection fraction was in the range of 60%   to 65%. There was dynamic obstruction at restin the mid cavity,   with a peak velocity of 263 cm/sec and a peak gradient of 28 mm   Hg. Wall motion was normal; there were no regional wall motion   abnormalities. Doppler parameters are consistent with abnormal   left ventricular relaxation (grade 1 diastolic dysfunction).   Doppler parameters are consistent with high ventricular filling   pressure. - Aortic valve: Noncoronary cusp mobility was restricted. There was   severe stenosis. There was mild to moderate regurgitation.   Regurgitation pressure half-time: 463 ms. - Mitral valve: Severely calcified annulus. Transvalvular velocity   was within the normal range. There was no evidence for stenosis.   There was no regurgitation. - Left atrium: The atrium was moderately dilated. - Right ventricle: The cavity size was normal. Wall thickness was   normal. Systolic function was normal. - Atrial septum: No defect or patent foramen ovale was identified   by color flow Doppler. - Tricuspid valve: There was mild regurgitation. - Pulmonary arteries: Systolic pressure was within the normal   range. PA peak pressure: 31 mm Hg (S).  Impressions:  - Compared with the echo 11/2015, aortic stenosis is now severe.  Carotid  11/02/14 1-39% bilateral   F/U duplex March 2018  11/08/14 normal  lexiscan myovue no ischemia or infarct reviewed    Uneventful knee replacement with Dr Maureen Ralphs in April 2016  I Has lost some weight   Spent over 40 minutes discussing progression of disease natural history and RX She is extremely anxious. She wants to go to grandson's [place] She is also very upset that I cannot do her heart cath  I spoke with Dr Burt Knack who is doing her cath tomorrow. He came by to meet her and allay her fears  Also discussed issues with surgery She would like to avoid sternotomy. Discussed referral to Dr Cyndia Bent or Roxy Manns Don't think she is a TAVR patient due to young age and lack of co morbidities  ROS: Denies fever, malais, weight loss, blurry vision, decreased visual acuity, cough, sputum, SOB, hemoptysis, pleuritic pain, palpitaitons, heartburn, abdominal pain, melena, lower extremity edema, claudication, or rash.  All other systems reviewed and negative  General: BP 130/70   Pulse 79   Ht 5\' 1"  (1.549 m)   Wt 235 lb 3.2 oz (106.7 kg)   SpO2 97%   BMI 44.44 kg/m  Affect appropriate Overweight white female HEENT: normal Neck supple with no adenopathy JVP normal no bruits no thyromegaly Lungs clear with no wheezing and good diaphragmatic motion Heart:  S1/S2 severe AS  murmur, no rub, gallop or click PMI normal Abdomen: benighn, BS positve, no tenderness, no AAA no bruit.  No HSM or HJR Distal pulses intact with no bruits No edema Neuro non-focal Skin  warm and dry No muscular weakness Post left TKR     Current Outpatient Prescriptions  Medication Sig Dispense Refill  . aspirin EC 81 MG tablet Take 81 mg by mouth every evening.    Marland Kitchen atorvastatin (LIPITOR) 40 MG tablet TAKE ONE TABLET BY MOUTH ONCE DAILY 90 tablet 3  . BIOTIN PO Take 1 tablet by mouth daily.    . Multiple Vitamin (MULTIVITAMIN WITH MINERALS) TABS tablet Take 1 tablet by mouth daily.    . naproxen sodium (ANAPROX) 220 MG tablet Take 220 mg by mouth daily as needed (pain).      . Vitamin E 100 UNIT/GM CREA Apply 1 g topically daily as needed (scars).      No current facility-administered medications for this visit.     Allergies  Advil [ibuprofen]; Penicillins; Demerol [meperidine]; Other; and Cortisone  Electrocardiogram:  SR normal rate 77  09/11/13    10/20/14  SR rate 79  Normal   09/15/15  SR NSR normal ECG 09/06/15 SR rate 73 normal    Assessment and Plan AS: progressive disease reviewed echo done 5.8/18 and mean gradient now 43 mmHg And peak gradient 81 mmHg Also with mild to moderate AR  Cath arranged for tomorrow With Dr Burt Knack Hopefully from right brachial vein and right radial artery She has a fear Of laying flat. Then she will be referred to CVTS for AVR. She will need dental work completed Before this Mostly cosmetic    Bruit  Duplex 1-39% disease in 2016 have ordered f/u duplex ? Referred AS murmur  Chol: labs today she would like to change to simvastatin if LDL under 100 probably can  HTN: Well controlled.  Continue current medications and low sodium Dash type diet.   Ortho: post left TKR Alusio 02/20/16    Jenkins Rouge

## 2017-01-14 ENCOUNTER — Encounter: Payer: Self-pay | Admitting: Cardiovascular Disease

## 2017-01-14 ENCOUNTER — Ambulatory Visit (INDEPENDENT_AMBULATORY_CARE_PROVIDER_SITE_OTHER): Payer: Medicare Other | Admitting: Cardiovascular Disease

## 2017-01-14 VITALS — BP 130/70 | HR 79 | Ht 61.0 in | Wt 235.2 lb

## 2017-01-14 DIAGNOSIS — I350 Nonrheumatic aortic (valve) stenosis: Secondary | ICD-10-CM

## 2017-01-14 LAB — CBC WITH DIFFERENTIAL/PLATELET
BASOS: 1 %
Basophils Absolute: 0 10*3/uL (ref 0.0–0.2)
EOS (ABSOLUTE): 0.1 10*3/uL (ref 0.0–0.4)
Eos: 2 %
Hematocrit: 42.3 % (ref 34.0–46.6)
Hemoglobin: 14.4 g/dL (ref 11.1–15.9)
Immature Grans (Abs): 0 10*3/uL (ref 0.0–0.1)
Immature Granulocytes: 0 %
LYMPHS ABS: 1.3 10*3/uL (ref 0.7–3.1)
Lymphs: 19 %
MCH: 28.8 pg (ref 26.6–33.0)
MCHC: 34 g/dL (ref 31.5–35.7)
MCV: 85 fL (ref 79–97)
MONOS ABS: 0.3 10*3/uL (ref 0.1–0.9)
Monocytes: 5 %
NEUTROS ABS: 5 10*3/uL (ref 1.4–7.0)
Neutrophils: 73 %
PLATELETS: 216 10*3/uL (ref 150–379)
RBC: 5 x10E6/uL (ref 3.77–5.28)
RDW: 14.3 % (ref 12.3–15.4)
WBC: 6.8 10*3/uL (ref 3.4–10.8)

## 2017-01-14 LAB — BASIC METABOLIC PANEL
BUN/Creatinine Ratio: 16 (ref 12–28)
BUN: 11 mg/dL (ref 8–27)
CO2: 24 mmol/L (ref 18–29)
Calcium: 9.6 mg/dL (ref 8.7–10.3)
Chloride: 100 mmol/L (ref 96–106)
Creatinine, Ser: 0.68 mg/dL (ref 0.57–1.00)
GFR calc Af Amer: 102 mL/min/{1.73_m2} (ref >59)
GFR, EST NON AFRICAN AMERICAN: 88 mL/min/{1.73_m2} (ref >59)
Glucose: 120 mg/dL — ABNORMAL HIGH (ref 65–99)
POTASSIUM: 4.5 mmol/L (ref 3.5–5.2)
Sodium: 141 mmol/L (ref 134–144)

## 2017-01-14 LAB — PROTIME-INR
INR: 1 (ref 0.8–1.2)
Prothrombin Time: 10.8 s (ref 9.1–12.0)

## 2017-01-14 NOTE — Patient Instructions (Addendum)
Medication Instructions:  Your physician has recommended you make the following change in your medication:  1-Decrease Aspirin 81 mg by mouth daily  Labwork: Your physician recommends that you have lab work today.  Testing/Procedures: NONE  Follow-Up: You have been referred to Dr. Cyndia Bent   Your physician wants you to follow-up next available with Dr. Johnsie Cancel.   If you need a refill on your cardiac medications before your next appointment, please call your pharmacy.   [place] [facility] [address], [address] [place] [address] Dept: [phone] Loc: [place]                01/11/2017  You are scheduled for a Cardiac Catheterization on Tuesday, May 15 with Dr. [name].  1. Please arrive at the [facility] (Main Entrance A) at [facility]: [address] [place], [place] [address] at 11:30 AM (two hours before your procedure to ensure your preparation). Free valet parking service is available.   Special note: Every effort is made to have your procedure done on time. Please understand that emergencies sometimes delay scheduled procedures.  2. Diet: Do not eat or drink anything after midnight prior to your procedure except sips of water to take medications.  3. Labs: You will need to have blood drawn on Monday, May 14 at [facility] [address]  Open: 7:30am - 5pm    Phone: [phone]. You do not need to be fasting.  4. Medication instructions in preparation for your procedure:  On the morning of your procedure, take your Aspirin and any morning medicines NOT listed above.  You may use sips of water.  5. Plan for one night stay--bring personal belongings. 6. Bring a current list of your medications and current insurance cards. 7. You MUST have a responsible person to drive you home. 8. Someone MUST be  with you the first 24 hours after you arrive home or your discharge will be delayed. 9. Please wear clothes that are easy to get on and off and wear slip-on shoes.  Thank you for allowing [place] to care for you!   -- [facility]

## 2017-01-15 ENCOUNTER — Ambulatory Visit (HOSPITAL_COMMUNITY)
Admission: RE | Admit: 2017-01-15 | Discharge: 2017-01-15 | Disposition: A | Discharge status: Home / Self Care | Payer: Medicare Other | Source: Ambulatory Visit | Attending: Cardiovascular Disease | Admitting: Cardiovascular Disease

## 2017-01-15 ENCOUNTER — Encounter (HOSPITAL_COMMUNITY)
Admission: RE | Disposition: A | Discharge status: Home / Self Care | Payer: Self-pay | Source: Ambulatory Visit | Attending: Cardiovascular Disease

## 2017-01-15 ENCOUNTER — Other Ambulatory Visit: Payer: Self-pay

## 2017-01-15 DIAGNOSIS — I2584 Coronary atherosclerosis due to calcified coronary lesion: Secondary | ICD-10-CM | POA: Diagnosis not present

## 2017-01-15 DIAGNOSIS — Z886 Allergy status to analgesic agent status: Secondary | ICD-10-CM | POA: Insufficient documentation

## 2017-01-15 DIAGNOSIS — I2510 Atherosclerotic heart disease of native coronary artery without angina pectoris: Secondary | ICD-10-CM | POA: Diagnosis not present

## 2017-01-15 DIAGNOSIS — I350 Nonrheumatic aortic (valve) stenosis: Secondary | ICD-10-CM | POA: Diagnosis not present

## 2017-01-15 DIAGNOSIS — Z880 Allergy status to penicillin: Secondary | ICD-10-CM | POA: Diagnosis not present

## 2017-01-15 DIAGNOSIS — Z7982 Long term (current) use of aspirin: Secondary | ICD-10-CM | POA: Diagnosis not present

## 2017-01-15 DIAGNOSIS — I10 Essential (primary) hypertension: Secondary | ICD-10-CM | POA: Insufficient documentation

## 2017-01-15 HISTORY — PX: LEFT HEART CATH AND CORONARY ANGIOGRAPHY: CATH118249

## 2017-01-15 SURGERY — LEFT HEART CATH AND CORONARY ANGIOGRAPHY
Anesthesia: LOCAL

## 2017-01-15 MED ORDER — FENTANYL CITRATE (PF) 100 MCG/2ML IJ SOLN
INTRAMUSCULAR | Status: DC | PRN
  Administered 2017-01-15: 25 ug via INTRAVENOUS

## 2017-01-15 MED ORDER — IOPAMIDOL (ISOVUE-370) INJECTION 76%
INTRAVENOUS | Status: DC | PRN
  Administered 2017-01-15: 80 mL via INTRA_ARTERIAL

## 2017-01-15 MED ORDER — VERAPAMIL HCL 2.5 MG/ML IV SOLN
INTRAVENOUS | Status: AC
  Filled 2017-01-15: qty 2

## 2017-01-15 MED ORDER — SODIUM CHLORIDE 0.9% FLUSH: 3.0000 mL | INTRAVENOUS | Status: DC | PRN

## 2017-01-15 MED ORDER — ASPIRIN 81 MG PO CHEW
81.0000 mg | CHEWABLE_TABLET | ORAL | Status: AC
  Administered 2017-01-15: 81 mg via ORAL

## 2017-01-15 MED ORDER — VERAPAMIL HCL 2.5 MG/ML IV SOLN
INTRAVENOUS | Status: DC | PRN
  Administered 2017-01-15: 15:00:00 via INTRA_ARTERIAL

## 2017-01-15 MED ORDER — HEPARIN (PORCINE) IN NACL 2-0.9 UNIT/ML-% IJ SOLN
INTRAMUSCULAR | Status: AC | PRN
  Administered 2017-01-15: 1000 mL

## 2017-01-15 MED ORDER — MIDAZOLAM HCL 2 MG/2ML IJ SOLN
INTRAMUSCULAR | Status: AC
  Filled 2017-01-15: qty 2

## 2017-01-15 MED ORDER — SODIUM CHLORIDE 0.9 % IV SOLN: 250.0000 mL | INTRAVENOUS | Status: DC | PRN

## 2017-01-15 MED ORDER — ASPIRIN 81 MG PO CHEW
CHEWABLE_TABLET | ORAL | Status: AC
  Filled 2017-01-15: qty 1

## 2017-01-15 MED ORDER — SODIUM CHLORIDE 0.9% FLUSH: 3.0000 mL | Freq: Two times a day (BID) | INTRAVENOUS | Status: DC

## 2017-01-15 MED ORDER — ONDANSETRON HCL 4 MG/2ML IJ SOLN: 4.0000 mg | Freq: Four times a day (QID) | INTRAMUSCULAR | Status: DC | PRN

## 2017-01-15 MED ORDER — HEPARIN (PORCINE) IN NACL 2-0.9 UNIT/ML-% IJ SOLN
INTRAMUSCULAR | Status: AC
  Filled 2017-01-15: qty 1000

## 2017-01-15 MED ORDER — MIDAZOLAM HCL 2 MG/2ML IJ SOLN
INTRAMUSCULAR | Status: DC | PRN
  Administered 2017-01-15: 2 mg via INTRAVENOUS

## 2017-01-15 MED ORDER — SODIUM CHLORIDE 0.9 % WEIGHT BASED INFUSION: 1.0000 mL/kg/h | INTRAVENOUS | Status: DC

## 2017-01-15 MED ORDER — SODIUM CHLORIDE 0.9 % WEIGHT BASED INFUSION: 3.0000 mL/kg/h | INTRAVENOUS | Status: AC

## 2017-01-15 MED ORDER — LIDOCAINE HCL 1 % IJ SOLN
INTRAMUSCULAR | Status: AC
  Filled 2017-01-15: qty 20

## 2017-01-15 MED ORDER — HEPARIN SODIUM (PORCINE) 1000 UNIT/ML IJ SOLN
INTRAMUSCULAR | Status: DC | PRN
  Administered 2017-01-15: 5000 [IU] via INTRAVENOUS

## 2017-01-15 MED ORDER — LIDOCAINE HCL (PF) 1 % IJ SOLN
INTRAMUSCULAR | Status: DC | PRN
  Administered 2017-01-15: 2 mL

## 2017-01-15 MED ORDER — FENTANYL CITRATE (PF) 100 MCG/2ML IJ SOLN
INTRAMUSCULAR | Status: AC
  Filled 2017-01-15: qty 2

## 2017-01-15 MED ORDER — HEPARIN SODIUM (PORCINE) 1000 UNIT/ML IJ SOLN
INTRAMUSCULAR | Status: AC
  Filled 2017-01-15: qty 1

## 2017-01-15 MED ORDER — IOPAMIDOL (ISOVUE-370) INJECTION 76%
INTRAVENOUS | Status: AC
  Filled 2017-01-15: qty 100

## 2017-01-15 MED ORDER — ACETAMINOPHEN 325 MG PO TABS: 650.0000 mg | ORAL_TABLET | ORAL | Status: DC | PRN

## 2017-01-15 SURGICAL SUPPLY — 9 items
CATH 5FR JL3.5 JR4 ANG PIG MP (CATHETERS) ×2 IMPLANT
DEVICE RAD COMP TR BAND LRG (VASCULAR PRODUCTS) ×2 IMPLANT
GLIDESHEATH SLEND SS 6F .021 (SHEATH) ×2 IMPLANT
GUIDEWIRE INQWIRE 1.5J.035X260 (WIRE) ×1 IMPLANT
INQWIRE 1.5J .035X260CM (WIRE) ×2
KIT HEART LEFT (KITS) ×2 IMPLANT
PACK CARDIAC CATHETERIZATION (CUSTOM PROCEDURE TRAY) ×2 IMPLANT
TRANSDUCER W/STOPCOCK (MISCELLANEOUS) ×2 IMPLANT
TUBING CIL FLEX 10 FLL-RA (TUBING) ×2 IMPLANT

## 2017-01-15 NOTE — Interval H&P Note (Signed)
History and Physical Interval Note:  01/15/2017 2:57 PM  Debra Knight  has presented today for surgery, with the diagnosis of severe aortic stenosis  The various methods of treatment have been discussed with the patient and family. After consideration of risks, benefits and other options for treatment, the patient has consented to  Procedure(s): Left Heart Cath and Coronary Angiography (N/A) as a surgical intervention .  The patient's history has been reviewed, patient examined, no change in status, stable for surgery.  I have reviewed the patient's chart and labs.  Questions were answered to the patient's satisfaction.     [name]

## 2017-01-15 NOTE — Interval H&P Note (Signed)
History and Physical Interval Note:  01/15/2017 12:47 PM  Debra Knight  has presented today for surgery, with the diagnosis of severe aortic stenosis  The various methods of treatment have been discussed with the patient and family. After consideration of risks, benefits and other options for treatment, the patient has consented to  Procedure(s): Right/Left Heart Cath and Coronary Angiography (N/A) as a surgical intervention .  The patient's history has been reviewed, patient examined, no change in status, stable for surgery.  I have reviewed the patient's chart and labs.  Questions were answered to the patient's satisfaction.     [name]

## 2017-01-15 NOTE — H&P (View-Only) (Signed)
Patient ID: Debra Knight, female   DOB: 15-Jan-1946, 71 y.o.   MRN: [id number]   71 y.o.  from [place]. Relocated here with husband. Here to discuss need for heart cath and AVR.   Echo 11/02/15  moderate AS mean gradient 63mmHg peak 58 mmHg reviewed: Echo 01/08/17 severe AS mean gradient 43 mmHg peak 81 mmHg mild to moderate AR  Study Conclusions  - Left ventricle: The cavity size was normal. There was severe   focal basal and moderate concentric hypertrophy of the septum   with otherwise mild concentric hypertrophy. Systolic function was   normal. The estimated ejection fraction was in the range of 60%   to 65%. There was dynamic obstruction at restin the mid cavity,   with a peak velocity of 263 cm/sec and a peak gradient of 28 mm   Hg. Wall motion was normal; there were no regional wall motion   abnormalities. Doppler parameters are consistent with abnormal   left ventricular relaxation (grade 1 diastolic dysfunction).   Doppler parameters are consistent with high ventricular filling   pressure. - Aortic valve: Noncoronary cusp mobility was restricted. There was   severe stenosis. There was mild to moderate regurgitation.   Regurgitation pressure half-time: 463 ms. - Mitral valve: Severely calcified annulus. Transvalvular velocity   was within the normal range. There was no evidence for stenosis.   There was no regurgitation. - Left atrium: The atrium was moderately dilated. - Right ventricle: The cavity size was normal. Wall thickness was   normal. Systolic function was normal. - Atrial septum: No defect or patent foramen ovale was identified   by color flow Doppler. - Tricuspid valve: There was mild regurgitation. - Pulmonary arteries: Systolic pressure was within the normal   range. PA peak pressure: 31 mm Hg (S).  Impressions:  - Compared with the echo 11/2015, aortic stenosis is now severe.  Carotid  11/02/14 1-39% bilateral   F/U duplex March 2018  11/08/14 normal  lexiscan myovue no ischemia or infarct reviewed    Uneventful knee replacement with Dr Maureen Ralphs in April 2016  I Has lost some weight   Spent over 40 minutes discussing progression of disease natural history and RX She is extremely anxious. She wants to go to grandson[place] She is also very upset that I cannot do her heart cath  I spoke with Dr Burt Knack who is doing her cath tomorrow. He came by to meet her and allay her fears  Also discussed issues with surgery She would like to avoid sternotomy. Discussed referral to Dr Cyndia Bent or Roxy Manns Don't think she is a TAVR patient due to young age and lack of co morbidities  ROS: Denies fever, malais, weight loss, blurry vision, decreased visual acuity, cough, sputum, SOB, hemoptysis, pleuritic pain, palpitaitons, heartburn, abdominal pain, melena, lower extremity edema, claudication, or rash.  All other systems reviewed and negative  General: BP 130/70   Pulse 79   Ht 5\' 1"  (1.549 m)   Wt 235 lb 3.2 oz (106.7 kg)   SpO2 97%   BMI 44.44 kg/m  Affect appropriate Overweight white female HEENT: normal Neck supple with no adenopathy JVP normal no bruits no thyromegaly Lungs clear with no wheezing and good diaphragmatic motion Heart:  S1/S2 severe AS  murmur, no rub, gallop or click PMI normal Abdomen: benighn, BS positve, no tenderness, no AAA no bruit.  No HSM or HJR Distal pulses intact with no bruits No edema Neuro non-focal Skin  warm and dry No muscular weakness Post left TKR     Current Outpatient Prescriptions  Medication Sig Dispense Refill  . aspirin EC 81 MG tablet Take 81 mg by mouth every evening.    Marland Kitchen atorvastatin (LIPITOR) 40 MG tablet TAKE ONE TABLET BY MOUTH ONCE DAILY 90 tablet 3  . BIOTIN PO Take 1 tablet by mouth daily.    . Multiple Vitamin (MULTIVITAMIN WITH MINERALS) TABS tablet Take 1 tablet by mouth daily.    . naproxen sodium (ANAPROX) 220 MG tablet Take 220 mg by mouth daily as needed (pain).      . Vitamin E 100 UNIT/GM CREA Apply 1 g topically daily as needed (scars).      No current facility-administered medications for this visit.     Allergies  Advil [ibuprofen]; Penicillins; Demerol [meperidine]; Other; and Cortisone  Electrocardiogram:  SR normal rate 77  09/11/13    10/20/14  SR rate 79  Normal   09/15/15  SR NSR normal ECG 09/06/15 SR rate 73 normal    Assessment and Plan AS: progressive disease reviewed echo done 5.8/18 and mean gradient now 43 mmHg And peak gradient 81 mmHg Also with mild to moderate AR  Cath arranged for tomorrow With Dr Burt Knack Hopefully from right brachial vein and right radial artery She has a fear Of laying flat. Then she will be referred to [facility] for AVR. She will need dental work completed Before this Mostly cosmetic    Bruit  Duplex 1-39% disease in 2016 have ordered f/u duplex ? Referred AS murmur  Chol: labs today she would like to change to simvastatin if LDL under 100 probably can  HTN: Well controlled.  Continue current medications and low sodium Dash type diet.   Ortho: post left TKR Alusio 02/20/16    Jenkins Rouge

## 2017-01-15 NOTE — Discharge Instructions (Signed)
Radial Site Care °Refer to this sheet in the next few weeks. These instructions provide you with information about caring for yourself after your procedure. Your health care provider may also give you more specific instructions. Your treatment has been planned according to current medical practices, but problems sometimes occur. Call your health care provider if you have any problems or questions after your procedure. °What can I expect after the procedure? °After your procedure, it is typical to have the following: °· Bruising at the radial site that usually fades within 1-2 weeks. °· Blood collecting in the tissue (hematoma) that may be painful to the touch. It should usually decrease in size and tenderness within 1-2 weeks. °Follow these instructions at home: °· Take medicines only as directed by your health care provider. °· You may shower 24-48 hours after the procedure or as directed by your health care provider. Remove the bandage (dressing) and gently wash the site with plain soap and water. Pat the area dry with a clean towel. Do not rub the site, because this may cause bleeding. °· Do not take baths, swim, or use a hot tub until your health care provider approves. °· Check your insertion site every day for redness, swelling, or drainage. °· Do not apply powder or lotion to the site. °· Do not flex or bend the affected arm for 24 hours or as directed by your health care provider. °· Do not push or pull heavy objects with the affected arm for 24 hours or as directed by your health care provider. °· Do not lift over 10 lb (4.5 kg) for 5 days after your procedure or as directed by your health care provider. °· Ask your health care provider when it is okay to: °¨ Return to work or school. °¨ Resume usual physical activities or sports. °¨ Resume sexual activity. °· Do not drive home if you are discharged the same day as the procedure. Have someone else drive you. °· You may drive 24 hours after the procedure  unless otherwise instructed by your health care provider. °· Do not operate machinery or power tools for 24 hours after the procedure. °· If your procedure was done as an outpatient procedure, which means that you went home the same day as your procedure, a responsible adult should be with you for the first 24 hours after you arrive home. °· Keep all follow-up visits as directed by your health care provider. This is important. °Contact a health care provider if: °· You have a fever. °· You have chills. °· You have increased bleeding from the radial site. Hold pressure on the site. °Get help right away if: °· You have unusual pain at the radial site. °· You have redness, warmth, or swelling at the radial site. °· You have drainage (other than a small amount of blood on the dressing) from the radial site. °· The radial site is bleeding, and the bleeding does not stop after 30 minutes of holding steady pressure on the site. °· Your arm or hand becomes pale, cool, tingly, or numb. °This information is not intended to replace advice given to you by your health care provider. Make sure you discuss any questions you have with your health care provider. °Document Released: 09/22/2010 Document Revised: 01/26/2016 Document Reviewed: 03/08/2014 °Elsevier Interactive Patient Education © 2017 [employer] ° °

## 2017-01-16 ENCOUNTER — Encounter (HOSPITAL_COMMUNITY): Payer: Self-pay | Admitting: Cardiovascular Disease

## 2017-01-23 ENCOUNTER — Encounter: Payer: Medicare Other | Admitting: Surgery

## 2017-02-05 DIAGNOSIS — M940 Chondrocostal junction syndrome [Tietze]: Secondary | ICD-10-CM | POA: Diagnosis not present

## 2017-02-05 DIAGNOSIS — R05 Cough: Secondary | ICD-10-CM | POA: Diagnosis not present

## 2017-02-05 DIAGNOSIS — R0781 Pleurodynia: Secondary | ICD-10-CM | POA: Diagnosis not present

## 2017-02-05 DIAGNOSIS — J208 Acute bronchitis due to other specified organisms: Secondary | ICD-10-CM | POA: Diagnosis not present

## 2017-02-06 ENCOUNTER — Other Ambulatory Visit: Payer: Self-pay | Admitting: *Deleted

## 2017-02-06 ENCOUNTER — Institutional Professional Consult (permissible substitution) (INDEPENDENT_AMBULATORY_CARE_PROVIDER_SITE_OTHER): Payer: Medicare Other | Admitting: Surgery

## 2017-02-06 ENCOUNTER — Encounter: Payer: Self-pay | Admitting: Surgery

## 2017-02-06 VITALS — BP 165/83 | HR 78 | Resp 16 | Ht 61.0 in | Wt 235.0 lb

## 2017-02-06 DIAGNOSIS — I350 Nonrheumatic aortic (valve) stenosis: Secondary | ICD-10-CM | POA: Diagnosis not present

## 2017-02-07 ENCOUNTER — Encounter: Payer: Self-pay | Admitting: Surgery

## 2017-02-07 DIAGNOSIS — R05 Cough: Secondary | ICD-10-CM | POA: Diagnosis not present

## 2017-02-07 DIAGNOSIS — J3489 Other specified disorders of nose and nasal sinuses: Secondary | ICD-10-CM | POA: Diagnosis not present

## 2017-02-07 DIAGNOSIS — J40 Bronchitis, not specified as acute or chronic: Secondary | ICD-10-CM | POA: Diagnosis not present

## 2017-02-07 DIAGNOSIS — R062 Wheezing: Secondary | ICD-10-CM | POA: Diagnosis not present

## 2017-02-07 DIAGNOSIS — R0602 Shortness of breath: Secondary | ICD-10-CM | POA: Diagnosis not present

## 2017-02-07 NOTE — Progress Notes (Signed)
Cardiothoracic Surgery Consultation  PCP is none per patient Referring Provider is Jenkins Rouge, MD  Chief Complaint  Patient presents with  . Aortic Stenosis    ECHO 01/08/17, CATH 01/15/17    HPI:  The patient is a 71 year old woman with a history of obesity, hyperlipidemia, OSA, DJD in her knees s/p bilateral TKR, and aortic stenosis that has been followed by Dr. Johnsie Cancel. Her most recent echo on 01/08/2017 showed progression to severe AS with a mean gradient of 43 mm Hg and a peak of 81 mm Hg. The indexed valve area is 0.5 cm2/m2. The LVEF was 60-65% with severe basal septal hypertrophy and mild concentric LVH. There was dynamic obstruction at rest in the mid-cavity with a peak velocity of 263 cm/sec and a peak gradient of 28 mm Hg. There was mild to moderate AI. Cardiac cath on 01/15/2017 showed a mean transvalvular gradient of 32 mm Hg and a peak of 48 mm Hg. There was an 80% proximal LCX stenosis before the OM1 and non-obstructive disease in the RCA and LAD.   She reports some bronchitis diagnosed yesterday with coughing and wheezing. She has had this before. At baseline she denies any shortness of breath or fatigue but is not very active due to her obesity and prior knee replacements. She denies and dizziness or syncope. She had had vertigo in the past which was visual and not lightheadedness.    Past Medical History:  Diagnosis Date  . Aortic stenosis    moderate on Echo 09/2013, Dr. Johnsie Cancel monitoring  . Carotid bruit    LICA 82-99% 11/7167 doppler "right"  . Enlarged thyroid   . Epiglottitis    hx of 40 years ago  . Family history of adverse reaction to anesthesia    "father- age 60 had confusion from anesthesia"  . GERD (gastroesophageal reflux disease)   . Heart murmur   . Hyperlipidemia   . OSA (obstructive sleep apnea)    no cpap   . Osteoarthritis of both knees   . Seasonal allergies   . Tinnitus   . Vertigo 07/2014   8 weeks  . Vertigo     Past Surgical History:    Procedure Laterality Date  . BIOPSY THYROID  2015  . DILATION AND CURETTAGE OF UTERUS    . LEFT HEART CATH AND CORONARY ANGIOGRAPHY N/A 01/15/2017   Procedure: Left Heart Cath and Coronary Angiography;  Surgeon: [name], MD;  Location: [facility];  Service: Cardiovascular;  Laterality: N/A;  . TONSILLECTOMY  age 62  . TOTAL KNEE ARTHROPLASTY Right 12/13/2014   Procedure: RIGHT TOTAL KNEE ARTHROPLASTY;  Surgeon: Gaynelle Arabian, MD;  Location: [facility];  Service: Orthopedics;  Laterality: Right;  . TOTAL KNEE ARTHROPLASTY Left 02/20/2016   Procedure: LEFT TOTAL KNEE ARTHROPLASTY;  Surgeon: Gaynelle Arabian, MD;  Location: [facility];  Service: Orthopedics;  Laterality: Left;    Family History  Problem Relation Age of Onset  . Heart disease Mother        deceased at 38 MI  . Prostate cancer Father        deceased at 76  . Diabetes Maternal Grandfather   . Diabetes Maternal Uncle   . Hashimoto's thyroiditis Daughter   . Hashimoto's thyroiditis Son     Social History Social History  Substance Use Topics  . Smoking status: Former Smoker    Packs/day: 2.00    Years: 15.00    Types: Cigarettes    Quit date:  09/03/1968  . Smokeless tobacco: Never Used     Comment: 1-2 ppd.   . Alcohol use No    Current Outpatient Prescriptions  Medication Sig Dispense Refill  . aspirin EC 81 MG tablet Take 81 mg by mouth every evening.    Marland Kitchen BIOTIN PO Take 1 tablet by mouth daily.    Marland Kitchen doxycycline (VIBRAMYCIN) 100 MG capsule Take 100 mg by mouth 2 (two) times daily. X 10 DAYS, ENDING 02/13/17    . Multiple Vitamin (MULTIVITAMIN WITH MINERALS) TABS tablet Take 1 tablet by mouth daily.    . naproxen sodium (ANAPROX) 220 MG tablet Take 220 mg by mouth daily as needed (pain).     . Pseudoephedrine-Guaifenesin (MUCINEX D PO) Take 1 capsule by mouth every 4 (four) hours as needed.    . Vitamin E 100 UNIT/GM CREA Apply 1 g topically daily as needed (scars).     Marland Kitchen atorvastatin (LIPITOR) 40 MG tablet TAKE  ONE TABLET BY MOUTH ONCE DAILY 90 tablet 3   No current facility-administered medications for this visit.     Allergies  Allergen Reactions  . Advil [Ibuprofen] Anaphylaxis, Swelling and Other (See Comments)    Throat swelling per patient  . Penicillins Hives, Swelling and Other (See Comments)    Tongue swelling Has patient had a PCN reaction causing immediate rash, facial/tongue/throat swelling, SOB or lightheadedness with hypotension: yes Has patient had a PCN reaction causing severe rash involving mucus membranes or skin necrosis: no Has patient had a PCN reaction that required hospitalization no Has patient had a PCN reaction occurring within the last 10 years: no If all of the above answers are "NO", then may proceed with Cepha  . Demerol [Meperidine] Other (See Comments)    vomiting  . Other Hives and Other (See Comments)    Duck  . Cortisone Anxiety and Other (See Comments)    Loopy     Review of Systems  Constitutional: Negative for activity change, appetite change, fatigue, fever and unexpected weight change.  HENT: Positive for dental problem.        Some loose teeth that need extraction and has been told that she will need further reconstruction after that.  Eyes: Negative.   Respiratory: Positive for cough and wheezing. Negative for shortness of breath.   Cardiovascular: Negative for chest pain, palpitations and leg swelling.  Gastrointestinal: Negative.   Endocrine: Negative.   Genitourinary: Negative.   Musculoskeletal: Negative.   Skin: Negative.   Neurological: Negative for dizziness and syncope.  Hematological: Negative.   Psychiatric/Behavioral: Negative.     BP (!) 165/83 (BP Location: Right Arm, Patient Position: Sitting, Cuff Size: Large)   Pulse 78   Resp 16   Ht 5\' 1"  (1.549 m)   Wt 235 lb (106.6 kg)   SpO2 95%   BMI 44.40 kg/m  Physical Exam  Constitutional: She is oriented to person, place, and time.  Obese woman in no distress  HENT:    Head: Normocephalic and atraumatic.  Mouth/Throat: Oropharynx is clear and moist.  Eyes: EOM are normal. Pupils are equal, round, and reactive to light.  Neck: Normal range of motion. Neck supple. No JVD present. No thyromegaly present.  Cardiovascular: Normal rate and regular rhythm.   Murmur heard. 3/6 systolic murmur RSB with soft S2. No diastolic murmur  Pulmonary/Chest: Effort normal. No respiratory distress. She has wheezes.  Abdominal: Soft. Bowel sounds are normal. She exhibits no distension. There is no tenderness.  Musculoskeletal: Normal range of  motion. She exhibits no edema.  Lymphadenopathy:    She has no cervical adenopathy.  Neurological: She is alert and oriented to person, place, and time. She has normal strength. No cranial nerve deficit or sensory deficit.  Skin: Skin is warm and dry.  Psychiatric: She has a normal mood and affect.     Diagnostic Tests:        [facility]                        [address], [place] [address]                            [phone]  ------------------------------------------------------------------- Transthoracic Echocardiography  Patient:    Zoriah, Pulice MR #:       [id number] Study Date: 01/08/2017 Gender:     F Age:        24 Height:     154.9 cm Weight:     105.2 kg BSA:        2.19 m^2 Pt. Status: Room:   ATTENDING    Jenkins Rouge, M.D.  ORDERING     Jenkins Rouge, M.D.  REFERRING    Jenkins Rouge, M.D.  SONOGRAPHER  Oletta Lamas, Will  PERFORMING   [facility], Outpatient  cc:  ------------------------------------------------------------------- LV EF: 60% -   65%  ------------------------------------------------------------------- Indications:      (I35.0).  ------------------------------------------------------------------- History:   PMH:  Acquired from the patient and from the patient&'s chart.  Aortic stenosis.  Risk factors:  Hypertension.  Obese. Dyslipidemia.  ------------------------------------------------------------------- Study Conclusions  - Left ventricle: The cavity size was normal. There was severe   focal basal and moderate concentric hypertrophy of the septum   with otherwise mild concentric hypertrophy. Systolic function was   normal. The estimated ejection fraction was in the range of 60%   to 65%. There was dynamic obstruction at restin the mid cavity,   with a peak velocity of 263 cm/sec and a peak gradient of 28 mm   Hg. Wall motion was normal; there were no regional wall motion   abnormalities. Doppler parameters are consistent with abnormal   left ventricular relaxation (grade 1 diastolic dysfunction).   Doppler parameters are consistent with high ventricular filling   pressure. - Aortic valve: Noncoronary cusp mobility was restricted. There was   severe stenosis. There was mild to moderate regurgitation.   Regurgitation pressure half-time: 463 ms. - Mitral valve: Severely calcified annulus. Transvalvular velocity   was within the normal range. There was no evidence for stenosis.   There was no regurgitation. - Left atrium: The atrium was moderately dilated. - Right ventricle: The cavity size was normal. Wall thickness was   normal. Systolic function was normal. - Atrial septum: No defect or patent foramen ovale was identified   by color flow Doppler. - Tricuspid valve: There was mild regurgitation. - Pulmonary arteries: Systolic pressure was within the normal   range. PA peak pressure: 31 mm Hg (S).  Impressions:  - Compared with the echo 11/2015, aortic stenosis is now severe.  ------------------------------------------------------------------- Study data:  Comparison was made to the study of 11/02/2015.  Study status:  Routine.  Procedure:  The patient reported no pain pre or post test. Transthoracic echocardiography for left ventricular function evaluation and  for assessment of  valvular function. Image quality was adequate.  Study completion:  There were no complications.          Transthoracic echocardiography.  M-mode, complete 2D, spectral Doppler, and color Doppler.  Birthdate: Patient birthdate: [birth year].  Age:  Patient is 71 yr old.  Sex: Gender: female.    BMI: 43.9 kg/m^2.  Blood pressure:     130/70 Patient status:  Outpatient.  Study date:  Study date: 01/08/2017. Study time: 04:13 PM.  Location:  [facility]  -------------------------------------------------------------------  ------------------------------------------------------------------- Left ventricle:  The cavity size was normal. There was severe focal basal and moderate concentric hypertrophy of the septum with otherwise mild concentric hypertrophy. Systolic function was normal. The estimated ejection fraction was in the range of 60% to 65%. There was dynamic obstruction at restin the mid cavity, with a peak velocity of 263 cm/sec and a peak gradient of 28 mm Hg. Wall motion was normal; there were no regional wall motion abnormalities. Doppler parameters are consistent with abnormal left ventricular relaxation (grade 1 diastolic dysfunction). Doppler parameters are consistent with high ventricular filling pressure.   ------------------------------------------------------------------- Aortic valve:   Trileaflet; severely thickened, severely calcified leaflets.  Noncoronary cusp mobility was restricted.  Doppler: There was severe stenosis.   There was mild to moderate regurgitation.    VTI ratio of LVOT to aortic valve: 0.36. Valve area (VTI): 1.02 cm^2. Indexed valve area (VTI): 0.47 cm^2/m^2. Peak velocity ratio of LVOT to aortic valve: 0.36. Valve area (Vmax): 1.02 cm^2. Indexed valve area (Vmax): 0.47 cm^2/m^2. Mean velocity ratio of LVOT to aortic valve: 0.38. Valve area (Vmean): 1.09 cm^2. Indexed valve area (Vmean): 0.5 cm^2/m^2.    Mean gradient (S): 43 mm Hg. Peak  gradient (S): 81 mm Hg.  ------------------------------------------------------------------- Aorta:  Aortic root: The aortic root was normal in size.  ------------------------------------------------------------------- Mitral valve:   Severely calcified annulus. Mobility was not restricted.  Doppler:  Transvalvular velocity was within the normal range. There was no evidence for stenosis. There was no regurgitation.    Peak gradient (D): 6 mm Hg.  ------------------------------------------------------------------- Left atrium:  The atrium was moderately dilated.  ------------------------------------------------------------------- Atrial septum:  No defect or patent foramen ovale was identified by color flow Doppler.  ------------------------------------------------------------------- Right ventricle:  The cavity size was normal. Wall thickness was normal. Systolic function was normal.  ------------------------------------------------------------------- Pulmonic valve:    Structurally normal valve.   Cusp separation was normal.  Doppler:  Transvalvular velocity was within the normal range. There was no evidence for stenosis. There was no regurgitation.  ------------------------------------------------------------------- Tricuspid valve:   Structurally normal valve.    Doppler: Transvalvular velocity was within the normal range. There was mild regurgitation.  ------------------------------------------------------------------- Pulmonary artery:   The main pulmonary artery was normal-sized. Systolic pressure was within the normal range.  ------------------------------------------------------------------- Right atrium:  The atrium was normal in size.  ------------------------------------------------------------------- Pericardium:  There was no pericardial effusion.  ------------------------------------------------------------------- Systemic veins: Inferior vena  cava: The vessel was normal in size. The respirophasic diameter changes were in the normal range (>= 50%), consistent with normal central venous pressure.  ------------------------------------------------------------------- Measurements   Left ventricle                            Value          Reference  LV ID, ED, PLAX chordal           (L)  32.7  mm       43 - 52  LV ID, ES, PLAX chordal           (L)     21.1  mm       23 - 38  LV fx shortening, PLAX chordal            35    %        >=29  LV PW thickness, ED                       12.3  mm       ---------  IVS/LV PW ratio, ED               (H)     1.7            <=1.3  Stroke volume, 2D                         94    ml       ---------  Stroke volume/bsa, 2D                     43    ml/m^2   ---------  LV e&', lateral                            4.48  cm/s     ---------  LV E/e&', lateral                          26.34          ---------  LV e&', medial                             3.9   cm/s     ---------  LV E/e&', medial                           30.26          ---------  LV e&', average                            4.19  cm/s     ---------  LV E/e&', average                          28.16          ---------    Ventricular septum                        Value          Reference  IVS thickness, ED                         20.96 mm       ---------    LVOT                                      Value          Reference  LVOT ID, S  19    mm       ---------  LVOT area                                 2.84  cm^2     ---------  LVOT ID                                   19    mm       ---------  LVOT peak velocity, S                     162   cm/s     ---------  LVOT mean velocity, S                     114   cm/s     ---------  LVOT VTI, S                               33.1  cm       ---------  LVOT peak gradient, S                     10    mm Hg    ---------  Stroke volume (SV), LVOT DP               93.8   ml       ---------  Stroke index (SV/bsa), LVOT DP            42.8  ml/m^2   ---------    Aortic valve                              Value          Reference  Aortic valve peak velocity, S             450   cm/s     ---------  Aortic valve mean velocity, S             298   cm/s     ---------  Aortic valve VTI, S                       92.1  cm       ---------  Aortic mean gradient, S                   43    mm Hg    ---------  Aortic peak gradient, S                   81    mm Hg    ---------  VTI ratio, LVOT/AV                        0.36           ---------  Aortic valve area, VTI                    1.02  cm^2     ---------  Aortic valve area/bsa, VTI                0.47  cm^2/m^2 ---------  Velocity ratio, peak, LVOT/AV             0.36           ---------  Aortic valve area, peak velocity          1.02  cm^2     ---------  Aortic valve area/bsa, peak               0.47  cm^2/m^2 ---------  velocity  Velocity ratio, mean, LVOT/AV             0.38           ---------  Aortic valve area, mean velocity          1.09  cm^2     ---------  Aortic valve area/bsa, mean               0.5   cm^2/m^2 ---------  velocity  Aortic regurg pressure half-time          463   ms       ---------    Aorta                                     Value          Reference  Aortic root ID, ED                        30    mm       ---------  Ascending aorta ID, A-P, S                34    mm       ---------    Left atrium                               Value          Reference  LA ID, A-P, ES                            36    mm       ---------  LA ID/bsa, A-P                            1.64  cm/m^2   <=2.2  LA volume, S                              71    ml       ---------  LA volume/bsa, S                          32.4  ml/m^2   ---------  LA volume, ES, 1-p A4C                    78    ml       ---------  LA volume/bsa, ES, 1-p A4C                35.6  ml/m^2   ---------  LA volume, ES, 1-p A2C  64    ml       ---------  LA volume/bsa, ES, 1-p A2C                29.2  ml/m^2   ---------    Mitral valve                              Value          Reference  Mitral E-wave peak velocity               118   cm/s     ---------  Mitral A-wave peak velocity               172   cm/s     ---------  Mitral deceleration time          (H)     553   ms       150 - 230  Mitral peak gradient, D                   6     mm Hg    ---------  Mitral E/A ratio, peak                    0.7            ---------    Pulmonary arteries                        Value          Reference  PA pressure, S, DP                (H)     31    mm Hg    <=30    Tricuspid valve                           Value          Reference  Tricuspid regurg peak velocity            265   cm/s     ---------  Tricuspid peak RV-RA gradient             28    mm Hg    ---------    Systemic veins                            Value          Reference  Estimated CVP                             3     mm Hg    ---------    Right ventricle                           Value          Reference  RV pressure, S, DP                (H)     31    mm Hg    <=30  RV s&', lateral, S  15.8  cm/s     ---------  Legend: (L)  and  (H)  mark values outside specified reference range.  ------------------------------------------------------------------- Prepared and Electronically Authenticated by  Skeet Latch, MD 2018-05-08T17:02:56   Physicians   Panel Physicians Referring Physician Case Authorizing Physician  [name], MD (Primary)    Procedures   Left Heart Cath and Coronary Angiography  Conclusion   1. Known severe aortic stenosis with peak and mean transvalvular gradients by cardiac catheterization of 48 and 32 mmHg, respectively  2. Severe single-vessel coronary artery disease involving the left circumflex  3. Mild nonobstructive disease involving the RCA and LAD  4. Severely calcified aortic  valve with restricted aortic valve leaflet mobility and severe mitral annular calcification  Recommendations: Patient to go for cardiac surgical evaluation for consideration of aortic valve replacement plus/minus grafting of the first OM  Indications   Severe aortic stenosis [I35.0 (ICD-10-CM)]  Procedural Details/Technique   Technical Details INDICATION: Severe aortic stenosis (pre-op study)  PROCEDURAL DETAILS: The right wrist was prepped, draped, and anesthetized with 1% lidocaine. Using the modified Seldinger technique, a 5/6 French Slender sheath was introduced into the right radial artery. 3 mg of verapamil was administered through the sheath, weight-based unfractionated heparin was administered intravenously. Standard Judkins catheters were used for selective coronary angiography. Left ventricular pressure is recorded with a JR4 catheter. A pullback gradient is measured across the aortic valve. Catheter exchanges were performed over an exchange length guidewire. There were no immediate procedural complications. A TR band was used for radial hemostasis at the completion of the procedure. The patient was transferred to the post catheterization recovery area for further monitoring.    Estimated blood loss <50 mL.  During this procedure the patient was administered the following to achieve and maintain moderate conscious sedation: Versed 2 mg, Fentanyl 25 mcg, while the patient's heart rate, blood pressure, and oxygen saturation were continuously monitored. The period of conscious sedation was 31 minutes, of which I was present face-to-face 100% of this time.    Coronary Findings   Dominance: Right  Left Main  The vessel exhibits minimal luminal irregularities.  Left Anterior Descending  Prox LAD to Mid LAD lesion, 40% stenosed. The lesion is calcified.  Left Circumflex  Prox Cx lesion, 80% stenosed. The lesion is eccentric. The lesion is moderately calcified. There is a tight eccentric  lesion in the proximal circumflex just before the bifurcation of the AV circumflex and the first obtuse marginal branch. The lesion is difficult to appreciate in most views but is well demonstrated on the caudal and RAO caudal views.  Mid Cx lesion, 50% stenosed. The lesion is eccentric.  Right Coronary Artery  The RCA is a dominant vessel. It is moderately calcified throughout. There is no high-grade obstruction.  Prox RCA lesion, 30% stenosed.  Mid RCA lesion, 30% stenosed. The lesion is eccentric.  First Right Posterolateral  Vessel is small in size.  1st RPLB lesion, 40% stenosed.  Left Heart   Mitral Valve The annulus is calcified.    Aortic Valve The aortic valve is calcified. There is restricted aortic valve motion.    Coronary Diagrams   Diagnostic Diagram       Implants     No implant documentation for this case.  PACS Images   Show images for Cardiac catheterization   Link to Procedure Log   Procedure Log    Hemo Data    Most Recent Value  Aortic Mean Gradient 32.3 mmHg  Aortic  Peak Gradient 48 mmHg  AO Systolic Pressure 188 mmHg  AO Diastolic Pressure 67 mmHg  AO Mean 94 mmHg  LV Systolic Pressure 416 mmHg  LV Diastolic Pressure 1 mmHg  LV EDP 11 mmHg  Arterial Occlusion Pressure Extended Systolic Pressure 606 mmHg  Arterial Occlusion Pressure Extended Diastolic Pressure 59 mmHg  Arterial Occlusion Pressure Extended Mean Pressure 84 mmHg  Left Ventricular Apex Extended Systolic Pressure 301 mmHg  Left Ventricular Apex Extended Diastolic Pressure 0 mmHg  Left Ventricular Apex Extended EDP Pressure 9 mmHg    RISK SCORES About the STS Risk Calculator Procedure: AV Replacement + CAB  Risk of Mortality: 1.61%  Morbidity or Mortality: 13.654%  Long Length of Stay: 6.496%  Short Length of Stay: 34.778%  Permanent Stroke: 1.799%  Prolonged Ventilation: 9.271%  DSW Infection: 0.373%  Renal Failure: 3.239%  Reoperation: 6.147%   Impression:  This 71  year old woman has stage C severe, asymptomatic aortic stenosis with an increasing gradient by echo. I have personally reviewed her echo and cath images. Her aortic valve is trileaflet with severely calcified and thickened leaflets with restricted mobility. Her LVEF remains normal with moderate concentric LVH and a hyperdynamic ventricle with kissing walls at the end of systole. There is more pronounced focal basal septal hypertrophy but that does not look like it is causing obstruction of the LVOT to me. A basal septal myomectomy could be done but I am not sure that it would change the mid-cavitary gradient. Her cath shows significant proximal LCX stenosis but she has never had any chest pain or pressure. She denies any symptoms but she is not very active which she says is due to her obesity and her knees, although she denies any pain in her knees after replacement. I think AVR and revascularization of her LCX is indicated. Her STS PROM for CABG and AVR is only 1.6% so I don't think she is a candidate for TAVR based on that. I am concerned about the size of there aortic root and valve on echo and the LVOT only measured 1.9 cm. I think it is worthwhile doing a gated cardiac CT to assess this further. I think she would have a slow recovery after open surgery due to her obesity and deconditioning, reduced mobility. I spent a long time in the office reviewing her studies and discussing treatment options with her and her husband. All of their questions have been answered.   Plan:  She will have a gated cardiac CT and will return to see me afterwards to review the results and make further plans.   I spent 60 minutes performing this consultation and > 50% of this time was spent face to face counseling and coordinating the care of this patient's severe aortic stenosis and single vessel CAD.   [name], MD [facility] [phone]

## 2017-02-14 ENCOUNTER — Encounter (HOSPITAL_COMMUNITY): Payer: Self-pay

## 2017-02-14 ENCOUNTER — Ambulatory Visit (HOSPITAL_COMMUNITY)
Admission: RE | Admit: 2017-02-14 | Discharge: 2017-02-14 | Disposition: A | Discharge status: Home / Self Care | Payer: Medicare Other | Source: Ambulatory Visit | Attending: Surgery | Admitting: Surgery

## 2017-02-14 DIAGNOSIS — I700 Atherosclerosis of aorta: Secondary | ICD-10-CM | POA: Diagnosis not present

## 2017-02-14 DIAGNOSIS — R918 Other nonspecific abnormal finding of lung field: Secondary | ICD-10-CM | POA: Insufficient documentation

## 2017-02-14 DIAGNOSIS — K760 Fatty (change of) liver, not elsewhere classified: Secondary | ICD-10-CM | POA: Insufficient documentation

## 2017-02-14 DIAGNOSIS — I350 Nonrheumatic aortic (valve) stenosis: Secondary | ICD-10-CM | POA: Diagnosis not present

## 2017-02-14 MED ORDER — METOPROLOL TARTRATE 5 MG/5ML IV SOLN: 5.0000 mg | INTRAVENOUS | Status: DC | PRN

## 2017-02-14 MED ORDER — IOPAMIDOL (ISOVUE-370) INJECTION 76%
INTRAVENOUS | Status: AC
  Administered 2017-02-14: 80 mL
  Filled 2017-02-14: qty 100

## 2017-02-14 MED ORDER — METOPROLOL TARTRATE 5 MG/5ML IV SOLN
INTRAVENOUS | Status: AC
  Administered 2017-02-14: 5 mg
  Filled 2017-02-14: qty 10

## 2017-02-26 ENCOUNTER — Encounter: Payer: Self-pay | Admitting: Surgery

## 2017-02-26 ENCOUNTER — Ambulatory Visit (INDEPENDENT_AMBULATORY_CARE_PROVIDER_SITE_OTHER): Payer: Medicare Other | Admitting: Surgery

## 2017-02-26 VITALS — BP 153/79 | HR 75 | Resp 20 | Ht 61.0 in | Wt 235.0 lb

## 2017-02-26 DIAGNOSIS — I350 Nonrheumatic aortic (valve) stenosis: Secondary | ICD-10-CM

## 2017-02-27 ENCOUNTER — Other Ambulatory Visit: Payer: Self-pay | Admitting: *Deleted

## 2017-02-27 DIAGNOSIS — I350 Nonrheumatic aortic (valve) stenosis: Secondary | ICD-10-CM

## 2017-03-01 ENCOUNTER — Encounter: Payer: Self-pay | Admitting: Surgery

## 2017-03-01 NOTE — Progress Notes (Addendum)
HPI:  The patient returns today to discuss the results of her recent gated cardiac CT and make further plans for treating her severe aortic stenosis. Her cath also showed an 80% proximal LCX stenosis before OM1. Her gated cardiac CT shows a trileaflet aortic valve with severe calcification and an annulus suitable for a 23 mm Sapien 3 valve. She has been doing fairly well at home and only has shortness of breath with significant exertion which she rarely does due to her obesity and DJD.  Current Outpatient Prescriptions  Medication Sig Dispense Refill  . aspirin EC 81 MG tablet Take 81 mg by mouth every evening.    Marland Kitchen atorvastatin (LIPITOR) 40 MG tablet TAKE ONE TABLET BY MOUTH ONCE DAILY 90 tablet 3  . BIOTIN PO Take 1 tablet by mouth daily.    . Multiple Vitamin (MULTIVITAMIN WITH MINERALS) TABS tablet Take 1 tablet by mouth daily.    . naproxen sodium (ANAPROX) 220 MG tablet Take 220 mg by mouth daily as needed (pain).     . Vitamin E 100 UNIT/GM CREA Apply 1 g topically daily as needed (scars).      No current facility-administered medications for this visit.      Physical Exam: BP (!) 153/79   Pulse 75   Resp 20   Ht 5\' 1"  (1.549 m)   Wt 235 lb (106.6 kg)   BMI 44.40 kg/m  She looks well Cardiac exam shows a regular rate and rhythm with a 3/6 systolic murmur along the RSB.  Diagnostic Tests:  ADDENDUM REPORT: 02/14/2017 19:42  CLINICAL DATA:  Aortic stenosis  EXAM: Cardiac TAVR CT  TECHNIQUE: The patient was scanned on a Siemens 128 scanner. A 120 kV retrospective scan was triggered in the ascending thoracic aorta at 120 HU's. Gantry rotation speed was 300 msecs and collimation was .67mm. 5mg  of lopressor given. The 3D data set was reconstructed in 5% intervals of the R-R cycle. Systolic and diastolic phases were analyzed on a dedicated work station using MPR, MIP and VRT modes. The patient received 80 cc of contrast.  FINDINGS: Aortic Valve: Tri leaflet  and severely calcified with restricted motion Mild calcification of the STJ and aortic atherosclerosis  Aorta: Mild calcific atherosclerosis with normal origin of the great vessels  Sinotubular Junction:  26 mm  Ascending Thoracic Aorta:  33 mm  Aortic Arch:  24 mm  Descending Thoracic Aorta:  22 mm  Sinus of Valsalva Measurements:  Non-coronary:  27 mm  Right -coronary:  27 mm  Left -coronary:  26 mm  Coronary Artery Height above Annulus:  Left Main:  Somewhat shallow at 9.8 mm  Right Coronary:  12.5 mm  Virtual Basal Annulus Measurements:  Maximum/Minimum Diameter:  20 mm x 25.5 mm  Perimeter:  72 mm  Area:  376 mm2  Coronary Arteries:  LM somewhat shallow in regard to deployment  Optimum Fluoroscopic Angle for Delivery:  LAO 11 CAU 12 degrees  IMPRESSION: 1) Severely calcified tri leaflet aortic valve with annulus suitable for a 23 mm Sapien 3 valve  2) Large nodular area of annular calcification at the base of the left cusp that may increase the risk of peri valvular regurgitation  3) LM some what shallow only 9.8 mm above annulus  4) No LAA thrombus  5) Optimum angle for deployment LAO 11 CAU 12 degrees  Jenkins Rouge   Electronically Signed   By: Jenkins Rouge M.D.   On: 02/14/2017 19:42  Impression:  This 71 year old woman has stage C severe, asymptomatic aortic stenosis. She is NYHA class l and has no limitation of activity due to heart failure because she is inactive due to her obesity and DJD with previous knee replacements. She also has an 80% LCX stenosis but no anginal symptoms. Her STS PROM for AVR and CABG is only 1.6% but I think she would have a slow, difficult recovery due to her obesity and DJD. She says she had a tough time recovering after her knee replacements. Her annulus is also relatively small for her BSA of 2.2 and I suspect that she would need a root replacement to get an adequately sized  valve to prevent patient-prosthesis mismatch. TAVR with a 23 mm Sapien 3 valve would give her a slightly larger valve but still small for her BSA. She would certainly have a smoother and quicker recovery after TAVR. It may be best to wait a while until she can have a Medtronic Evolut valve that may give her a larger orifice and lower gradient. She is really not have any significant symptoms at this time and I think it would be safe to wait for a couple months if that would give her a better result. I discussed this with her and her husband and they are inclined to wait.   Plan:  I will discuss her case with the heart valve team before making any further decisions about her care. We will then contact her with the follow up plan.     I spent 15 minutes performing this established patient evaluation and > 50% of this time was spent face to face counseling and coordinating the care of this patient's aortic stenosis.   [name], MD [facility] [phone]

## 2017-03-14 ENCOUNTER — Encounter (HOSPITAL_COMMUNITY): Payer: Self-pay | Admitting: Dentistry

## 2017-03-14 ENCOUNTER — Ambulatory Visit (HOSPITAL_COMMUNITY): Payer: Self-pay | Admitting: Dentistry

## 2017-03-14 VITALS — BP 157/60 | HR 72 | Temp 98.2°F

## 2017-03-14 DIAGNOSIS — K045 Chronic apical periodontitis: Secondary | ICD-10-CM

## 2017-03-14 DIAGNOSIS — I350 Nonrheumatic aortic (valve) stenosis: Secondary | ICD-10-CM

## 2017-03-14 DIAGNOSIS — K036 Deposits [accretions] on teeth: Secondary | ICD-10-CM

## 2017-03-14 DIAGNOSIS — K0889 Other specified disorders of teeth and supporting structures: Secondary | ICD-10-CM

## 2017-03-14 DIAGNOSIS — K06020 Generalized gingival recession, unspecified: Secondary | ICD-10-CM

## 2017-03-14 DIAGNOSIS — K08409 Partial loss of teeth, unspecified cause, unspecified class: Secondary | ICD-10-CM

## 2017-03-14 DIAGNOSIS — M264 Malocclusion, unspecified: Secondary | ICD-10-CM

## 2017-03-14 DIAGNOSIS — M2632 Excessive spacing of fully erupted teeth: Secondary | ICD-10-CM

## 2017-03-14 DIAGNOSIS — Z01818 Encounter for other preprocedural examination: Secondary | ICD-10-CM

## 2017-03-14 DIAGNOSIS — K0530 Chronic periodontitis, unspecified: Secondary | ICD-10-CM

## 2017-03-14 DIAGNOSIS — M270 Developmental disorders of jaws: Secondary | ICD-10-CM

## 2017-03-14 NOTE — Patient Instructions (Signed)
[facility]    Department of Dental Medicine     DR. Siyona Coto      HEART VALVES AND MOUTH CARE:  FACTS:   If you have any infection in your mouth, it can infect your heart valve.  If you heart valve is infected, you will be seriously ill.  Infections in the mouth can be SILENT and do not always cause pain.  Examples of infections in the mouth are gum disease, dental cavities, and abscesses.  Some possible signs of infection are: Bad breath, bleeding gums, or teeth that are sensitive to sweets, hot, and/or cold. There are many other signs as well.  WHAT YOU HAVE TO DO:   Brush your teeth after meals and at bedtime. Spend at least 2 minutes brushing well, especially behind your back teeth and all around your teeth that stand alone. Brush at the gumline also.  Do not go to bed without brushing your teeth and flossing.  If you gums bleed when you brush or floss, do NOT stop brushing or flossing. It usually means that your gums need more attention and better cleaning.   If your Dentist or Dr. Vibha Ferdig gave you a prescription mouthwash to use, make sure to use it as directed. If you run out of the medication, get a refill at the pharmacy.   If you were given any other medications or directions by your Dentist, please follow them. If you did not understand the directions or forget what you were told, please call. We will be happy to refresh her memory.  If you need antibiotics before dental procedures, make sure you take them one hour prior to every dental visit as directed.   Get a dental checkup every 4-6 months in order to keep your mouth healthy, or to find and treat any new infection. You will most likely need your teeth cleaned or gums treated at the same time.  If you are not able to come in for your scheduled appointment, call your Dentist as soon as possible to reschedule.  If you have a problem in between dental visits, call your Dentist.  

## 2017-03-14 NOTE — Progress Notes (Signed)
DENTAL CONSULTATION  Date of Consultation:  03/14/2017 Patient Name:   Debra Knight Date of Birth:   June 09, 1946 Medical Record Number: [id number]  VITALS: BP (!) 157/60 (BP Location: Left Arm)   Pulse 72   Temp 98.2 F (36.8 C) (Oral)   CHIEF COMPLAINT: Patient referred by Dr. Cyndia Knight for dental consultation.  HPI: Debra Knight is a 71 year old female recently diagnosed with severe aortic stenosis. Patient with anticipated aortic valve replacement. The patient is now seen as part of a medically necessary pre-heart valve surgery dental protocol examination to rule out dental infection that may affect the patient's systemic health and anticipated heart valve surgery.  The patient currently denies acute toothaches, swellings, or abscesses. Patient was recently evaluated in late May of 2018 by a dentist in [place], [place].  The patient was subsequently referred to a periodontist, Dr. Levada Knight, in [facility] for an evaluation 2-3 weeks ago. The periodontist recommended multiple dental extractions and periodontal therapy. The patient did not proceed with any dental treatment at that time due to pending heart surgery.  The patient indicates that was interested in another dental opinion prior to proceeding with the recommendations of the Periodontist. Patient has not had regular dental care for the past 7 years.  The patient denies having any partial dentures. The patient denies having dental phobia.    PROBLEM LIST: Patient Active Problem List   Diagnosis Date Noted  . Aortic stenosis 09/11/2013    Priority: High  . Arthritis of knee, degenerative 12/13/2014  . Preop cardiovascular exam 10/20/2014  . Osteoarthritis of both knees   . Abnormal thyroid exam   . HTN (hypertension) 09/11/2013  . Carotid bruit 09/11/2013  . Hyperlipidemia   . OSA (obstructive sleep apnea) 04/09/2013    PMH: Past Medical History:  Diagnosis Date  . Aortic stenosis    moderate on  Echo 09/2013, Dr. Johnsie Knight monitoring  . Carotid bruit    LICA 27-25% 11/6642 doppler "right"  . Enlarged thyroid   . Epiglottitis    hx of 40 years ago  . Family history of adverse reaction to anesthesia    "father- age 17 had confusion from anesthesia"  . GERD (gastroesophageal reflux disease)   . Heart murmur   . Hyperlipidemia   . OSA (obstructive sleep apnea)    no cpap   . Osteoarthritis of both knees   . Seasonal allergies   . Tinnitus   . Vertigo 07/2014   8 weeks    PSH: Past Surgical History:  Procedure Laterality Date  . BIOPSY THYROID  2015  . DILATION AND CURETTAGE OF UTERUS    . LEFT HEART CATH AND CORONARY ANGIOGRAPHY N/A 01/15/2017   Procedure: Left Heart Cath and Coronary Angiography;  Surgeon: Debra Mocha, MD;  Location: [facility];  Service: Cardiovascular;  Laterality: N/A;  . TONSILLECTOMY  age 60  . TOTAL KNEE ARTHROPLASTY Right 12/13/2014   Procedure: RIGHT TOTAL KNEE ARTHROPLASTY;  Surgeon: Debra Arabian, MD;  Location: [facility];  Service: Orthopedics;  Laterality: Right;  . TOTAL KNEE ARTHROPLASTY Left 02/20/2016   Procedure: LEFT TOTAL KNEE ARTHROPLASTY;  Surgeon: Debra Arabian, MD;  Location: [facility];  Service: Orthopedics;  Laterality: Left;    ALLERGIES: Allergies  Allergen Reactions  . Advil [Ibuprofen] Anaphylaxis, Swelling and Other (See Comments)    Throat swelling per patient  . Penicillins Hives, Swelling and Other (See Comments)    Tongue swelling Has patient had a PCN reaction causing immediate  rash, facial/tongue/throat swelling, SOB or lightheadedness with hypotension: yes Has patient had a PCN reaction causing severe rash involving mucus membranes or skin necrosis: no Has patient had a PCN reaction that required hospitalization no Has patient had a PCN reaction occurring within the last 10 years: no If all of the above answers are "NO", then may proceed with Cepha  . Demerol [Meperidine] Other (See Comments)    vomiting  . Other  Hives and Other (See Comments)    Duck  . Cortisone Anxiety and Other (See Comments)    Loopy     MEDICATIONS: Current Outpatient Prescriptions  Medication Sig Dispense Refill  . aspirin EC 81 MG tablet Take 81 mg by mouth every evening.    Marland Kitchen atorvastatin (LIPITOR) 40 MG tablet TAKE ONE TABLET BY MOUTH ONCE DAILY 90 tablet 3  . BIOTIN PO Take 1 tablet by mouth daily.    . Multiple Vitamin (MULTIVITAMIN WITH MINERALS) TABS tablet Take 1 tablet by mouth daily.    . naproxen sodium (ANAPROX) 220 MG tablet Take 220 mg by mouth daily as needed (pain).     . Vitamin E 100 UNIT/GM CREA Apply 1 g topically daily as needed (scars).      No current facility-administered medications for this visit.     LABS: Lab Results  Component Value Date   WBC 6.8 01/14/2017   HGB 14.4 01/14/2017   HCT 42.3 01/14/2017   MCV 85 01/14/2017   PLT 216 01/14/2017      Component Value Date/Time   NA 141 01/14/2017 1125   K 4.5 01/14/2017 1125   CL 100 01/14/2017 1125   CO2 24 01/14/2017 1125   GLUCOSE 120 (H) 01/14/2017 1125   GLUCOSE 137 (H) 02/22/2016 0425   BUN 11 01/14/2017 1125   CREATININE 0.68 01/14/2017 1125   CALCIUM 9.6 01/14/2017 1125   GFRNONAA 88 01/14/2017 1125   GFRAA 102 01/14/2017 1125   Lab Results  Component Value Date   INR 1.0 01/14/2017   INR 1.09 02/14/2016   INR 1.05 12/07/2014   No results found for: PTT  SOCIAL HISTORY: Social History   Social History  . Marital status: Married    Spouse name: N/A  . Number of children: N/A  . Years of education: N/A   Occupational History  . self employed    Social History Main Topics  . Smoking status: Former Smoker    Packs/day: 2.00    Years: 15.00    Types: Cigarettes    Quit date: 09/03/1968  . Smokeless tobacco: Never Used     Comment: 1-2 ppd.   . Alcohol use No  . Drug use: No  . Sexual activity: Not on file   Other Topics Concern  . Not on file   Social History Narrative  . No narrative on file     FAMILY HISTORY: Family History  Problem Relation Age of Onset  . Heart disease Mother        deceased at 70 MI  . Prostate cancer Father        deceased at 63  . Diabetes Maternal Grandfather   . Diabetes Maternal Uncle   . Hashimoto's thyroiditis Daughter   . Hashimoto's thyroiditis Son     REVIEW OF SYSTEMS: Reviewed with the patient as per History of present illness. Psych: Patient denies having dental phobia.  DENTAL HISTORY: CHIEF COMPLAINT: Patient referred by Dr. Cyndia Knight for dental consultation.  HPI: RADHIKA DERSHEM is a 71 year old female  recently diagnosed with severe aortic stenosis. Patient with anticipated aortic valve replacement. The patient is now seen as part of a medically necessary pre-heart valve surgery dental protocol examination to rule out dental infection that may affect the patient's systemic health and anticipated heart valve surgery.  The patient currently denies acute toothaches, swellings, or abscesses. Patient was recently evaluated in late May of 2018 by a dentist in [place], [place].  The patient was subsequently referred to a periodontist, Dr. Levada Knight, in [facility] for an evaluation 2-3 weeks ago. The periodontist recommended multiple dental extractions and periodontal therapy. The patient did not proceed with any dental treatment at that time due to pending heart surgery.  The patient indicates that was interested in another dental opinion prior to proceeding with the recommendations of the Periodontist. Patient has not had regular dental care for the past 7 years.  The patient denies having any partial dentures. The patient denies having dental phobia.   DENTAL EXAMINATION: GENERAL: The patient is a well-developed, obese female in no acute distress. HEAD AND NECK: There is no palpable neck lymphadenopathy. The patient denies acute TMJ symptoms. INTRAORAL EXAM: The patient has normal saliva. The patient has bilateral  mandibular lingual tori in the area of tooth numbers 19-22 and 26-30. DENTITION: Patient is missing tooth numbers 1-4, 14, 17, 25 and 32. Patient has retained root segments in the area of tooth numbers 16 and 19. PERIODONTAL: Patient has chronic periodontitis with plaque and calculus accumulations, generalized gingival recession, generalized tooth mobility, and moderate to severe bone loss. DENTAL CARIES/SUBOPTIMAL RESTORATIONS: There are multiple dental caries and suboptimal dental restorations noted. ENDODONTIC: The patient currently denies acute pulpitis symptoms. The patient has periapical pathology and radiolucency associated with the apices of tooth numbers 5, 15, 16, 19, 23, 24, 30, and 31. CROWN AND BRIDGE: There are no crown or bridge restorations noted. PROSTHODONTIC: Patient denies having partial dentures. OCCLUSION: Patient has a poor occlusal scheme secondary to multiple missing teeth, multiple retained root segments, supra-eruption and drifting of the unopposed teeth into the edentulous areas, and lack or presence of the missing teeth with dental prostheses.  RADIOGRAPHIC INTERPRETATION: Orthopantogram was taken and supplemented with 13 periapical radiographs and 2 bitewings. There are multiple missing teeth. There is supra-eruption and drifting of the unopposed teeth into the edentulous areas. There multiple retained root segments. There multiple areas of periapical pathology and radiolucency. There is moderate to severe bone loss noted. There are radiopacities noted consistent with the presence of bilateral mandibular lingual tori.  ASSESSMENTS: 1. Severe aortic stenosis 2. Pre-heart valve surgery dental protocol 3. Chronic apical periodontitis 4. Dental caries 5. Multiple retained root segments 6. Bilateral mandibular lingual tori 7. Chronic periodontitis with bone loss 8. Generalized gingival recession 9. Multiple loose teeth 10. Accretions 11. Multiple missing teeth 12.  Multiple diastemas  13. Supra-eruption and drifting of the unopposed teeth into the edentulous areas 14. Poor occlusal scheme and malocclusion 15. Risk for complications up to and including death with anticipated invasive dental procedures in the operating room with general anesthesia due to overall cardiovascular and respiratory compromise   PLAN/RECOMMENDATIONS: 1. I discussed the risks, benefits, and complications of various treatment options with the patient in relationship to her medical and dental conditions, anticipated heart valve surgery, and risk for endocarditis. We discussed various treatment options to include no treatment, total and subtotal extractions with alveoloplasty, pre-prosthetic surgery as indicated, periodontal therapy, dental restorations, root canal therapy, crown and bridge therapy, implant therapy,  and replacement of missing teeth as indicated. We discussed referral to an oral surgeon as well as the provision of dental treatment in the operating room with general anesthesia. The patient currently wishes to proceed with continued workup by the cardiology team but is considering extraction of remaining teeth with alveoloplasty and pre-prosthetic surgery as needed in the operating room with general anesthesia with Dental Medicine. The patient will then follow-up with a dentist of her choice for fabrication of upper and lower complete dentures with or without implants after adequate healing.  I will assist in referral to an oral and maxillofacial prosthodontist if the patient so desires.   2. Discussion of findings with medical team and coordination of future medical and dental care as needed.  I spent in excess of  120 minutes during the conduct of this consultation and >50% of this time involved direct face-to-face encounter for counseling and/or coordination of the patient's care.    Lenn Cal, DDS

## 2017-03-18 ENCOUNTER — Telehealth: Payer: Self-pay | Admitting: Cardiovascular Disease

## 2017-03-18 NOTE — Telephone Encounter (Signed)
Informed patient that Dr. Johnsie Cancel is not in the office at this time. Referred her to Dr. Vivi Martens office since she recently has seen him, and she is having several test done tomorrow for him as well. Will send to Dr. Vivi Martens nurse.

## 2017-03-18 NOTE — Telephone Encounter (Signed)
New Message    Pt wants to know if she can fly to [place] for the weekend. Requests a call back.

## 2017-03-19 ENCOUNTER — Ambulatory Visit: Payer: Medicare Other | Attending: Surgery | Admitting: Physical Therapy

## 2017-03-19 ENCOUNTER — Ambulatory Visit (HOSPITAL_COMMUNITY)
Admission: RE | Admit: 2017-03-19 | Discharge: 2017-03-19 | Disposition: A | Discharge status: Home / Self Care | Payer: Medicare Other | Source: Ambulatory Visit | Attending: Surgery | Admitting: Surgery

## 2017-03-19 ENCOUNTER — Encounter: Payer: Self-pay | Admitting: Physical Therapy

## 2017-03-19 ENCOUNTER — Ambulatory Visit (HOSPITAL_BASED_OUTPATIENT_CLINIC_OR_DEPARTMENT_OTHER)
Admission: RE | Admit: 2017-03-19 | Discharge: 2017-03-19 | Disposition: A | Discharge status: Home / Self Care | Payer: Medicare Other | Source: Ambulatory Visit | Attending: Surgery | Admitting: Surgery

## 2017-03-19 ENCOUNTER — Ambulatory Visit (HOSPITAL_COMMUNITY): Payer: Medicare Other

## 2017-03-19 DIAGNOSIS — I2510 Atherosclerotic heart disease of native coronary artery without angina pectoris: Secondary | ICD-10-CM | POA: Insufficient documentation

## 2017-03-19 DIAGNOSIS — K5730 Diverticulosis of large intestine without perforation or abscess without bleeding: Secondary | ICD-10-CM | POA: Insufficient documentation

## 2017-03-19 DIAGNOSIS — J988 Other specified respiratory disorders: Secondary | ICD-10-CM | POA: Diagnosis not present

## 2017-03-19 DIAGNOSIS — R293 Abnormal posture: Secondary | ICD-10-CM | POA: Insufficient documentation

## 2017-03-19 DIAGNOSIS — I350 Nonrheumatic aortic (valve) stenosis: Secondary | ICD-10-CM

## 2017-03-19 DIAGNOSIS — I700 Atherosclerosis of aorta: Secondary | ICD-10-CM | POA: Insufficient documentation

## 2017-03-19 DIAGNOSIS — R2689 Other abnormalities of gait and mobility: Secondary | ICD-10-CM | POA: Diagnosis not present

## 2017-03-19 DIAGNOSIS — Z452 Encounter for adjustment and management of vascular access device: Secondary | ICD-10-CM | POA: Diagnosis not present

## 2017-03-19 DIAGNOSIS — I6523 Occlusion and stenosis of bilateral carotid arteries: Secondary | ICD-10-CM | POA: Diagnosis not present

## 2017-03-19 LAB — VAS US CAROTID
LCCADSYS: -89 cm/s
LCCAPDIAS: 13 cm/s
LCCAPSYS: 83 cm/s
LEFT ECA DIAS: -6 cm/s
LEFT VERTEBRAL DIAS: 11 cm/s
Left CCA dist dias: -24 cm/s
Left ICA dist dias: -23 cm/s
Left ICA dist sys: -86 cm/s
Left ICA prox dias: -23 cm/s
Left ICA prox sys: -88 cm/s
RCCADSYS: -116 cm/s
RCCAPDIAS: 16 cm/s
RCCAPSYS: 109 cm/s
RIGHT ECA DIAS: -11 cm/s
RIGHT VERTEBRAL DIAS: -12 cm/s

## 2017-03-19 LAB — PULMONARY FUNCTION TEST
DL/VA % PRED: 93 %
DL/VA: 4.13 ml/min/mmHg/L
DLCO UNC: 15.19 ml/min/mmHg
DLCO unc % pred: 75 %
FEF 25-75 POST: 2.02 L/s
FEF 25-75 Pre: 1.86 L/sec
FEF2575-%Change-Post: 9 %
FEF2575-%Pred-Post: 124 %
FEF2575-%Pred-Pre: 113 %
FEV1-%Change-Post: 1 %
FEV1-%PRED-POST: 106 %
FEV1-%PRED-PRE: 104 %
FEV1-POST: 2.05 L
FEV1-Pre: 2.02 L
FEV1FVC-%Change-Post: 3 %
FEV1FVC-%Pred-Pre: 105 %
FEV6-%CHANGE-POST: -2 %
FEV6-%PRED-PRE: 104 %
FEV6-%Pred-Post: 102 %
FEV6-POST: 2.49 L
FEV6-Pre: 2.55 L
FEV6FVC-%PRED-POST: 105 %
FEV6FVC-%Pred-Pre: 105 %
FVC-%Change-Post: -2 %
FVC-%PRED-PRE: 99 %
FVC-%Pred-Post: 97 %
FVC-POST: 2.5 L
FVC-PRE: 2.55 L
POST FEV6/FVC RATIO: 100 %
PRE FEV1/FVC RATIO: 79 %
Post FEV1/FVC ratio: 82 %
Pre FEV6/FVC Ratio: 100 %
RV % pred: 89 %
RV: 1.87 L
TLC % PRED: 97 %
TLC: 4.46 L

## 2017-03-19 LAB — POCT I-STAT CREATININE: CREATININE: 0.5 mg/dL (ref 0.44–1.00)

## 2017-03-19 MED ORDER — ALBUTEROL SULFATE (2.5 MG/3ML) 0.083% IN NEBU
2.5000 mg | INHALATION_SOLUTION | Freq: Once | RESPIRATORY_TRACT | Status: AC
  Administered 2017-03-19: 2.5 mg via RESPIRATORY_TRACT

## 2017-03-19 MED ORDER — IOPAMIDOL (ISOVUE-370) INJECTION 76%
INTRAVENOUS | Status: AC
  Administered 2017-03-19: 100 mL
  Filled 2017-03-19: qty 100

## 2017-03-19 NOTE — Therapy (Signed)
[place], [place], [address] Phone: [phone]   Fax:  [phone]  Physical Therapy Evaluation  Patient Details  Name: Debra Knight MRN: [id number] Date of Birth: 10/09/1945 Referring Provider: Dr. Gilford Raid  Encounter Date: 03/19/2017      PT End of Session - 03/19/17 1514    Visit Number 1   PT Start Time 0347   PT Stop Time 4259   PT Time Calculation (min) 45 min      Past Medical History:  Diagnosis Date  . Aortic stenosis    moderate on Echo 09/2013, Dr. Johnsie Cancel monitoring  . Carotid bruit    LICA 56-38% 03/5642 doppler "right"  . Enlarged thyroid   . Epiglottitis    hx of 40 years ago  . Family history of adverse reaction to anesthesia    "father- age 72 had confusion from anesthesia"  . GERD (gastroesophageal reflux disease)   . Heart murmur   . Hyperlipidemia   . OSA (obstructive sleep apnea)    no cpap   . Osteoarthritis of both knees   . Seasonal allergies   . Tinnitus   . Vertigo 07/2014   8 weeks    Past Surgical History:  Procedure Laterality Date  . BIOPSY THYROID  2015  . DILATION AND CURETTAGE OF UTERUS    . LEFT HEART CATH AND CORONARY ANGIOGRAPHY N/A 01/15/2017   Procedure: Left Heart Cath and Coronary Angiography;  Surgeon: [name], MD;  Location: [facility];  Service: Cardiovascular;  Laterality: N/A;  . TONSILLECTOMY  age 63  . TOTAL KNEE ARTHROPLASTY Right 12/13/2014   Procedure: RIGHT TOTAL KNEE ARTHROPLASTY;  Surgeon: Gaynelle Arabian, MD;  Location: [facility];  Service: Orthopedics;  Laterality: Right;  . TOTAL KNEE ARTHROPLASTY Left 02/20/2016   Procedure: LEFT TOTAL KNEE ARTHROPLASTY;  Surgeon: Gaynelle Arabian, MD;  Location: [facility];  Service: Orthopedics;  Laterality: Left;    There were no vitals filed for this visit.       Subjective Assessment - 03/19/17 1516    Subjective Pt reports she mainly noticing symptoms when trying to walk fast  which she first noticed in May at a college graduation.    Patient Stated Goals to fix heart   Currently in Pain? No/denies            [facility] PT Assessment - 03/19/17 0001      Assessment   Medical Diagnosis several aortic stenosis   Referring Provider Dr. Gilford Raid   Onset Date/Surgical Date --  May 2018     Precautions   Precautions None     Restrictions   Weight Bearing Restrictions No     Balance Screen   Has the patient fallen in the past 6 months Yes   How many times? 1  environmental hazard   Has the patient had a decrease in activity level because of a fear of falling?  No   Is the patient reluctant to leave their home because of a fear of falling?  No     Home [employer] residence   Living Arrangements Spouse/significant other   [place] to enter   Entrance Stairs-Number of Steps 1   Entrance Stairs-Rails None   Home Layout Multi-level;Able to live on main level with bedroom/bathroom     Prior Function   Level of Independence Independent     Posture/Postural Control   Posture/Postural Control Postural limitations  Postural Limitations Rounded Shoulders;Forward head     ROM / Strength   AROM / PROM / Strength AROM;Strength     AROM   Overall AROM Comments grossly WNL     Strength   Overall Strength Comments grossly 4+/5 throughout   Strength Assessment Site Hand   Right/Left hand Right;Left   Right Hand Grip (lbs) 35   Left Hand Grip (lbs) 40  L hand dominant     Ambulation/Gait   Gait Comments Pt ambulates with decr. knee flexion bil and with excessive lateral trunk sway. Pt's distance was limited by 57% for age/gender in 6 minute walk test.      6 minute walk test results    Aerobic Endurance Distance Walked 665          [facility] Pre-Surgical Assessment - 03/19/17 0001    5 Meter Walk Test- trial 1 9 sec   5 Meter Walk Test- trial 2 8 sec.    5 Meter Walk Test- trial 3 8 sec.  O2 dropped to 93%    5 meter walk test average 8.33 sec   4 Stage Balance Test tolerated for:  8 sec.   4 Stage Balance Test Position 4   Sit To Stand Test- trial 1 19 sec.   ADL/IADL Independent with: Bathing;Dressing;Meal prep;Finances   ADL/IADL Needs Assistance with: Valla Leaver work   ADL/IADL Fraility Index Vulnerable   6 Minute Walk- Baseline yes   BP (mmHg) 149/71   HR (bpm) 75   02 Sat (%RA) 97 %   Modified Borg Scale for Dyspnea 0- Nothing at all   Perceived Rate of Exertion (Borg) 6-   6 Minute Walk Post Test yes   BP (mmHg) (!)  162/105  127 70 after 5 minutes   HR (bpm) 101   02 Sat (%RA) 87 %   Modified Borg Scale for Dyspnea 3- Moderate shortness of breath or breathing difficulty   Perceived Rate of Exertion (Borg) 13- Somewhat hard          Objective measurements completed on examination: See above findings.                  PT Education - 03/19/17 1621    Education provided Yes   Education Details community exercise options once cleared by MD after surgery   Person(s) Educated Patient   Methods Explanation   Comprehension Verbalized understanding                     Plan - 03/19/17 1622    Clinical Impression Statement see below   PT Frequency One time visit   Consulted and Agree with Plan of Care Patient     Clinical Impression Statement: Pt is a 71 yo female presenting to OP PT for evaluation prior to possible TAVR surgery due to severe aortic stenosis. Pt reports onset of shortness of breath when walking faster speeds approximately 2 months ago. Pt reports anxiety over heart valve issues which is leader to her becoming even more sedentary. Pt presents with good ROM and strength, good balance and is not at high fall risk 4 stage balance test, slow walking speed and poor aerobic endurance per 6 minute walk test. Pt ambulated a total of 665 feet in 6 minute walk. Diastolic BP increased significantly and oxygen saturation decreased significantly with 6  minute walk test. Based on the Short Physical Performance Battery, patient has a frailty rating of 7/12 with </= 5/12 considered frail.  Patient demonstrated the following deficits and impairments:     Visit Diagnosis: Other abnormalities of gait and mobility  Abnormal posture      G-Codes - 2017-04-03 1623    Functional Assessment Tool Used (Outpatient Only) 6 minute walk 665'   Functional Limitation Mobility: Walking and moving around   Mobility: Walking and Moving Around Current Status [phone]) At least 40 percent but less than 60 percent impaired, limited or restricted   Mobility: Walking and Moving Around Goal Status [phone]) At least 40 percent but less than 60 percent impaired, limited or restricted   Mobility: Walking and Moving Around Discharge Status [phone]) At least 40 percent but less than 60 percent impaired, limited or restricted       Problem List Patient Active Problem List   Diagnosis Date Noted  . Arthritis of knee, degenerative 12/13/2014  . Preop cardiovascular exam 10/20/2014  . Osteoarthritis of both knees   . Abnormal thyroid exam   . HTN (hypertension) 09/11/2013  . Aortic stenosis 09/11/2013  . Carotid bruit 09/11/2013  . Hyperlipidemia   . OSA (obstructive sleep apnea) 04/09/2013    Santa Abdelrahman, PT 04/03/17, 4:23 PM  [facility] [address] [place], [place], [address] Phone: [phone]   Fax:  [phone]  Name: BRENNLEY CURTICE MRN: [id number] Date of Birth: 1945-10-02

## 2017-03-19 NOTE — Progress Notes (Signed)
VASCULAR LAB PRELIMINARY  PRELIMINARY  PRELIMINARY  PRELIMINARY  Carotid duplex completed.    Preliminary report:  Bilateral:  1-39% ICA stenosis.  Vertebral artery flow is antegrade.     Shantice Menger, RVS 03/19/2017, 3:09 PM

## 2017-03-20 ENCOUNTER — Encounter: Payer: Self-pay | Admitting: *Deleted

## 2017-03-26 ENCOUNTER — Other Ambulatory Visit: Payer: Self-pay | Admitting: *Deleted

## 2017-03-26 DIAGNOSIS — I350 Nonrheumatic aortic (valve) stenosis: Secondary | ICD-10-CM

## 2017-04-02 ENCOUNTER — Telehealth (HOSPITAL_COMMUNITY): Payer: Self-pay | Admitting: Dentistry

## 2017-04-02 NOTE — Progress Notes (Signed)
Patient ID: Debra Knight, female   DOB: June 24, 1946, 71 y.o.   MRN: [id number]   71 y.o.  from [place]. Relocated here with husband. Has had progressive AS and being evaluated for surgery Echo reviewed 01/08/17 with EF 60-65% mean gradient 43 mmHg peak 81 mmHg with mild to moderate AR Reviewed Cath films Dr Burt Knack 01/15/17 with 80 % proximal circumflex stenosis Carotid duplex with no significant stenosis Has seen Dr Enrique Sack and needs dental work.  CTA showed annulus suitable for a 23 mm Sapien 3 valve with area of 376 mm2 Peripheral vascular sizes adequate for transfemoral approach  Surgical consult Dr Cyndia Bent done 02/26/17  Concern for needing root enlargement Also thought of waiting for Medtronic Evolut valve for larger orifice area.    Spent over 45 minutes discussing issues of surgery. In particular she has issues with her dental care Did not have a good interaction with Dr Lawana Chambers. Discussed need to get teeth sorted before procedure And she may need to do this in [facility] She asked for another referral for oral surgeon and I gave her Dr Kerry Kass name   ROS: Denies fever, malais, weight loss, blurry vision, decreased visual acuity, cough, sputum, SOB, hemoptysis, pleuritic pain, palpitaitons, heartburn, abdominal pain, melena, lower extremity edema, claudication, or rash.  All other systems reviewed and negative   General: BP (!) 144/66   Pulse 76   Ht 5\' 1"  (1.549 m)   Wt 245 lb 12.8 oz (111.5 kg)   SpO2 97%   BMI 46.44 kg/m  Affect appropriate Overweight white female HEENT: normal Neck supple with no adenopathy JVP normal no bruits no thyromegaly Lungs clear with no wheezing and good diaphragmatic motion Heart:  S1/S2 severe AS  murmur, no rub, gallop or click PMI normal Abdomen: benighn, BS positve, no tenderness, no AAA no bruit.  No HSM or HJR Distal pulses intact with no bruits No edema Neuro non-focal Skin warm and dry No muscular weakness Post left TKR      Current Outpatient Prescriptions  Medication Sig Dispense Refill  . aspirin EC 81 MG tablet Take 81 mg by mouth every evening.    Marland Kitchen atorvastatin (LIPITOR) 40 MG tablet TAKE ONE TABLET BY MOUTH ONCE DAILY 90 tablet 3  . BIOTIN PO Take 1 tablet by mouth daily.    . Multiple Vitamin (MULTIVITAMIN WITH MINERALS) TABS tablet Take 1 tablet by mouth daily.    . naproxen sodium (ANAPROX) 220 MG tablet Take 220 mg by mouth daily as needed (pain).     . Vitamin E 100 UNIT/GM CREA Apply 1 g topically daily as needed (scars).      No current facility-administered medications for this visit.     Allergies  Advil [ibuprofen]; Penicillins; Demerol [meperidine]; Other; and Cortisone  Electrocardiogram:  SR normal rate 77  09/11/13    10/20/14  SR rate 79  Normal   09/15/15  SR NSR normal ECG 09/06/15 SR rate 73 normal    Assessment and Plan AS: progressive disease reviewed echo done 5.8/18 and mean gradient now 43 mmHg And peak gradient 81 mmHg Also with mild to moderate AR   Needs to have dental work Done before TAVR Hopefully will get in to see Dr Buelah Manis She needs multiple teeth pulled And partial denture/bridge. She is tentatively scheduled for 8/21 which may be first day we Have Medtronic valve but I am not sure she will have her dental issues resolved by then   CAD:  Will need ?  LIMA to OM if she has open surgical repair no angina ASA/statin   Bruit  Duplex 1-39% disease more likely referred AS murmur  Chol: labs today she would like to change to simvastatin if LDL under 100 probably can  HTN: Well controlled.  Continue current medications and low sodium Dash type diet.   Ortho: post left TKR Alusio 02/20/16    Jenkins Rouge

## 2017-04-02 NOTE — Telephone Encounter (Signed)
04/02/2017  Patient:            Debra Knight Date of Birth:  18-Jun-1946 MRN:                [id number]   I had an extensive telephone conversation with the patient concerning my dental findings and discussion of various treatment options. I discussed my conversations with her primary dentist, Dr. Margaree Mackintosh and her periodontist, Dr. Levada Dy. We discussed treatment options to include total odontectomy with alveoloplasty and pre-prosthetic surgery as needed versus selective extraction of tooth numbers 5, 12, 13, 15, 16, 19, 23, 24, 26, 30, and 31 with alveoloplasty and bilateral mandibular tori reductions in the operating room with general anesthesia. Patient is adamant about not wanting extraction of remaining teeth at this time. Patient did agree to the selective extractions with alveoloplasty and pre-prosthetic surgery as needed as described above. Patient is to see Dr. Johnsie Cancel, her cardiologist, toorrow to obtain surgical clearance for the operating room procedure. Patient will then be scheduled for early next week as time and space permits in the operating room schedule. Patient is aware of the risk for bleeding, bruising, swelling, infection, nerve damage, soft tissue damage, damage to adjacent teeth, sinus involvement, root tip fracture, mandible fracture, and risk for complications up to and including death due to overall cardiovascular and respiratory compromise. We also discussed possible need for admission for 23 hour observation due to her history of sleep apnea with no current CPAP device.  Patient had all questions answered. I will schedule her operating room procedure after review of Dr. Kyla Balzarine note from tomorrow.  Dr. Teena Dunk

## 2017-04-03 ENCOUNTER — Ambulatory Visit (INDEPENDENT_AMBULATORY_CARE_PROVIDER_SITE_OTHER): Payer: Medicare Other | Admitting: Cardiovascular Disease

## 2017-04-03 ENCOUNTER — Encounter: Payer: Self-pay | Admitting: Cardiovascular Disease

## 2017-04-03 VITALS — BP 144/66 | HR 76 | Ht 61.0 in | Wt 245.8 lb

## 2017-04-03 DIAGNOSIS — I350 Nonrheumatic aortic (valve) stenosis: Secondary | ICD-10-CM | POA: Diagnosis not present

## 2017-04-03 NOTE — Patient Instructions (Signed)
Medication Instructions:  Your physician recommends that you continue on your current medications as directed. Please refer to the Current Medication list given to you today.  Labwork: NONE  Testing/Procedures: NONE  Follow-Up: Your physician wants you to follow-up in: 3 months with Dr. Nishan.   If you need a refill on your cardiac medications before your next appointment, please call your pharmacy.    

## 2017-04-11 ENCOUNTER — Encounter: Payer: Self-pay | Admitting: *Deleted

## 2017-04-17 ENCOUNTER — Encounter: Payer: Self-pay | Admitting: Thoracic Surgery (Cardiothoracic Vascular Surgery)

## 2017-04-17 ENCOUNTER — Encounter: Payer: Self-pay | Admitting: *Deleted

## 2017-04-17 ENCOUNTER — Institutional Professional Consult (permissible substitution) (INDEPENDENT_AMBULATORY_CARE_PROVIDER_SITE_OTHER): Payer: Medicare Other | Admitting: Thoracic Surgery (Cardiothoracic Vascular Surgery)

## 2017-04-17 VITALS — BP 150/44 | HR 80 | Resp 20 | Ht 61.0 in | Wt 242.0 lb

## 2017-04-17 DIAGNOSIS — I350 Nonrheumatic aortic (valve) stenosis: Secondary | ICD-10-CM | POA: Diagnosis not present

## 2017-04-17 NOTE — Progress Notes (Signed)
[facility]  CARDIOTHORACIC SURGERY CONSULTATION REPORT  Referring Provider is Josue Hector, MD PCP is Patient, No Pcp Per  Chief Complaint  Patient presents with  . Aortic Stenosis    2nd TAVR eval, review all studies, surgery scheduled 04/23/17    HPI:  Patient is a 71 year old obese female with history of aortic stenosis, hyperlipidemia, obstructive sleep apnea, and degenerative arthritis referred for second surgical opinion to discuss treatment options for management of severe aortic stenosis.  Patient states that she was first discovered to have a heart murmur on physical exam when she was being evaluated for obstructive sleep apnea. She was referred to Dr. Johnsie Cancel who has been following her for the last several years.  Echocardiogram performed in 2017 revealed moderate aortic stenosis with normal left ventricular systolic function. Follow-up echocardiogram performed 01/08/2017 revealed significant progression of disease with peak velocity across the aortic valve measured 4.5 m/s corresponding to mean transvalvular gradient estimated 43 mmHg and aortic valve area calculated only 0.47 cm. Left ventricular systolic function remained normal with ejection fraction estimated 60-65%.  Diagnostic cardiac catheterization was performed by Dr. Burt Knack on 01/15/2017. This confirmed the presence of severe aortic stenosis with peak to peak and mean transvalvular gradients measured 48 and 32 mmHg respectively. The patient was also noted to have severe single-vessel coronary artery disease with 80% stenosis involving the left circumflex coronary artery.  The patient was referred for surgical consultation and has been seen previously by Dr. Cyndia Bent and felt the patient would be at at least moderate risk for conventional surgery because of her age, comorbid medical conditions, limited mobility with physical deconditioning, and particularly because of the  presence of a relatively small size aortic root. He felt that aortic root enlargement or root replacement likely be necessary at the time of conventional surgery to avoid the need for the development of patient prosthesis mismatch. Transcatheter aortic valve replacement has been contemplated as an alternative and the patient has been referred for second surgical opinion.  The patient is married and lives with her husband in [place]. They are originally from [place] and relocated to [place] more than 7 years ago. 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 The patient admits that she lives and extremely sedentary lifestyle. She suffers from degenerative arthritis and had to undergo bilateral total knee replacement. Since then she admits that she doesn't do much of anything physically anymore. However, she is able to walk without significant need for physical assistance. She states that in May when she was walking and upset with her daughter she experienced an episode of tightness in her chest that subsided fairly quickly with rest. She denies any history of exertional shortness of breath or fatigue but she admits that she really doesn't exercise much at all. She has never had any PND, orthopnea, or lower extremity edema. She has a reported history of vertigo and dizziness without syncope.  Past Medical History:  Diagnosis Date  . Aortic stenosis    moderate on Echo 09/2013, Dr. Johnsie Cancel monitoring  . Carotid bruit    LICA 26-71% 10/4578 doppler "right"  . Enlarged thyroid   . Epiglottitis    hx of 40 years ago  . Family history of adverse reaction to anesthesia    "father- age 22 had confusion from anesthesia"  . GERD (gastroesophageal reflux disease)   . Heart murmur   . Hyperlipidemia   . OSA (  obstructive sleep apnea)    no cpap   . Osteoarthritis of both knees   . Seasonal allergies   . Tinnitus   . Vertigo 07/2014   8 weeks     Past Surgical History:  Procedure Laterality Date  . BIOPSY THYROID  2015  . DILATION AND CURETTAGE OF UTERUS    . LEFT HEART CATH AND CORONARY ANGIOGRAPHY N/A 01/15/2017   Procedure: Left Heart Cath and Coronary Angiography;  Surgeon: [name], MD;  Location: [facility];  Service: Cardiovascular;  Laterality: N/A;  . TONSILLECTOMY  age 27  . TOTAL KNEE ARTHROPLASTY Right 12/13/2014   Procedure: RIGHT TOTAL KNEE ARTHROPLASTY;  Surgeon: Gaynelle Arabian, MD;  Location: [facility];  Service: Orthopedics;  Laterality: Right;  . TOTAL KNEE ARTHROPLASTY Left 02/20/2016   Procedure: LEFT TOTAL KNEE ARTHROPLASTY;  Surgeon: Gaynelle Arabian, MD;  Location: [facility];  Service: Orthopedics;  Laterality: Left;    Family History  Problem Relation Age of Onset  . Heart disease Mother   . Heart attack Mother 19  . Prostate cancer Father   . Diabetes Maternal Grandfather   . Diabetes Maternal Uncle   . Hashimoto's thyroiditis Daughter   . Hashimoto's thyroiditis Son     Social History   Social History  . Marital status: Married    Spouse name: N/A  . Number of children: N/A  . Years of education: N/A   Occupational History  . self employed    Social History Main Topics  . Smoking status: Former Smoker    Packs/day: 2.00    Years: 15.00    Types: Cigarettes    Quit date: 09/03/1968  . Smokeless tobacco: Never Used     Comment: 1-2 ppd.   . Alcohol use No  . Drug use: No  . Sexual activity: Not on file   Other Topics Concern  . Not on file   Social History Narrative  . No narrative on file    Current Outpatient Prescriptions  Medication Sig Dispense Refill  . aspirin EC 81 MG tablet Take 81 mg by mouth every evening.    Marland Kitchen atorvastatin (LIPITOR) 40 MG tablet TAKE ONE TABLET BY MOUTH ONCE DAILY (Patient taking differently: TAKE ONE TABLET BY MOUTH ONCE AT BEDTIME.) 90 tablet 3  . BIOTIN PO Take 1 tablet by mouth daily.    . Multiple Vitamin (MULTIVITAMIN WITH MINERALS) TABS  tablet Take 1 tablet by mouth daily.    . naproxen sodium (ANAPROX) 220 MG tablet Take 220 mg by mouth 2 (two) times daily as needed (pain).      No current facility-administered medications for this visit.     Allergies  Allergen Reactions  . Advil [Ibuprofen] Anaphylaxis, Swelling and Other (See Comments)    Throat swelling per patient  . Penicillins Hives, Swelling and Other (See Comments)    Tongue swelling Has patient had a PCN reaction causing immediate rash, facial/tongue/throat swelling, SOB or lightheadedness with hypotension: yes Has patient had a PCN reaction causing severe rash involving mucus membranes or skin necrosis: no Has patient had a PCN reaction that required hospitalization no Has patient had a PCN reaction occurring within the last 10 years: no If all of the above answers are "NO", then may proceed with Cepha  . Demerol [Meperidine] Nausea And Vomiting  . Other Hives and Other (See Comments)    Duck  . Cortisone Anxiety and Other (See Comments)    Loopy  Review of Systems:   General:  normal appetite, normal energy, no weight gain, no weight loss, no fever  Cardiac:  One episode chest pain with exertion, no chest pain at rest, one episode SOB with exertion, no resting SOB, no PND, no orthopnea, no palpitations, no arrhythmia, no atrial fibrillation, no LE edema, + dizzy spells, no syncope  Respiratory:  no shortness of breath, no home oxygen, no productive cough, no dry cough, no bronchitis, no wheezing, no hemoptysis, no asthma, no pain with inspiration or cough, + sleep apnea, no CPAP at night  GI:   no difficulty swallowing, no reflux, no frequent heartburn, no hiatal hernia, no abdominal pain, no constipation, no diarrhea, no hematochezia, no hematemesis, no melena  GU:   no dysuria,  no frequency, no urinary tract infection, no hematuria, no kidney stones, no kidney disease  Vascular:  no pain suggestive of claudication, no pain in feet, no leg  cramps, no varicose veins, no DVT, no non-healing foot ulcer  Neuro:   no stroke, no TIA's, no seizures, no headaches, no temporary blindness one eye,  no slurred speech, no peripheral neuropathy, no chronic pain, some instability of gait, no memory/cognitive dysfunction  Musculoskeletal: + arthritis, no joint swelling, no myalgias, no difficulty walking, somewhat limited mobility   Skin:   no rash, no itching, no skin infections, no pressure sores or ulcerations  Psych:   no anxiety, no depression, no nervousness, no unusual recent stress  Eyes:   no blurry vision, no floaters, no recent vision changes, + wears glasses or contacts  ENT:   no hearing loss, no loose or painful teeth, scheduled for dental extraction later this week  Hematologic:  no easy bruising, no abnormal bleeding, no clotting disorder, no frequent epistaxis  Endocrine:  no diabetes, does not check CBG's at home           Physical Exam:   BP (!) 150/44   Pulse 80   Resp 20   Ht 5\' 1"  (1.549 m)   Wt 242 lb (109.8 kg)   SpO2 97% Comment: RA  BMI 45.73 kg/m   General:  Obese female NAD    HEENT:  Unremarkable   Neck:   no JVD, no bruits, no adenopathy   Chest:   clear to auscultation, symmetrical breath sounds, no wheezes, no rhonchi   CV:   RRR, grade IV/VI crescendo/decrescendo murmur heard best at RSB,  no diastolic murmur  Abdomen:  soft, non-tender, no masses   Extremities:  warm, well-perfused, pulses diminished but palpable, no LE edema  Rectal/GU  Deferred  Neuro:   Grossly non-focal and symmetrical throughout  Skin:   Clean and dry, no rashes, no breakdown   Diagnostic Tests:  Transthoracic Echocardiography  Patient:    Debra Knight, Debra Knight MR #:       [id number] Study Date: 01/08/2017 Gender:     F Age:        85 Height:     154.9 cm Weight:     105.2 kg BSA:        2.19 m^2 Pt. Status: Room:   ATTENDING    Jenkins Rouge, M.D.  ORDERING     Jenkins Rouge, M.D.  REFERRING    Jenkins Rouge,  M.D.  SONOGRAPHER  Oletta Lamas, Will  PERFORMING   [facility], Outpatient  cc:  ------------------------------------------------------------------- LV EF: 60% -   65%  ------------------------------------------------------------------- Indications:      (I35.0).  ------------------------------------------------------------------- History:   PMH:  Acquired from  the patient and from the patient&'s chart.  Aortic stenosis.  Risk factors:  Hypertension. Obese. Dyslipidemia.  ------------------------------------------------------------------- Study Conclusions  - Left ventricle: The cavity size was normal. There was severe   focal basal and moderate concentric hypertrophy of the septum   with otherwise mild concentric hypertrophy. Systolic function was   normal. The estimated ejection fraction was in the range of 60%   to 65%. There was dynamic obstruction at restin the mid cavity,   with a peak velocity of 263 cm/sec and a peak gradient of 28 mm   Hg. Wall motion was normal; there were no regional wall motion   abnormalities. Doppler parameters are consistent with abnormal   left ventricular relaxation (grade 1 diastolic dysfunction).   Doppler parameters are consistent with high ventricular filling   pressure. - Aortic valve: Noncoronary cusp mobility was restricted. There was   severe stenosis. There was mild to moderate regurgitation.   Regurgitation pressure half-time: 463 ms. - Mitral valve: Severely calcified annulus. Transvalvular velocity   was within the normal range. There was no evidence for stenosis.   There was no regurgitation. - Left atrium: The atrium was moderately dilated. - Right ventricle: The cavity size was normal. Wall thickness was   normal. Systolic function was normal. - Atrial septum: No defect or patent foramen ovale was identified   by color flow Doppler. - Tricuspid valve: There was mild regurgitation. - Pulmonary arteries: Systolic pressure was within  the normal   range. PA peak pressure: 31 mm Hg (S).  Impressions:  - Compared with the echo 11/2015, aortic stenosis is now severe.  ------------------------------------------------------------------- Study data:  Comparison was made to the study of 11/02/2015.  Study status:  Routine.  Procedure:  The patient reported no pain pre or post test. Transthoracic echocardiography for left ventricular function evaluation and for assessment of valvular function. Image quality was adequate.  Study completion:  There were no complications.          Transthoracic echocardiography.  M-mode, complete 2D, spectral Doppler, and color Doppler.  Birthdate: Patient birthdate: 11/29/1945.  Age:  Patient is 71 yr old.  Sex: Gender: female.    BMI: 43.9 kg/m^2.  Blood pressure:     130/70 Patient status:  Outpatient.  Study date:  Study date: 01/08/2017. Study time: 04:13 PM.  Location:  [facility]  -------------------------------------------------------------------  ------------------------------------------------------------------- Left ventricle:  The cavity size was normal. There was severe focal basal and moderate concentric hypertrophy of the septum with otherwise mild concentric hypertrophy. Systolic function was normal. The estimated ejection fraction was in the range of 60% to 65%. There was dynamic obstruction at restin the mid cavity, with a peak velocity of 263 cm/sec and a peak gradient of 28 mm Hg. Wall motion was normal; there were no regional wall motion abnormalities. Doppler parameters are consistent with abnormal left ventricular relaxation (grade 1 diastolic dysfunction). Doppler parameters are consistent with high ventricular filling pressure.   ------------------------------------------------------------------- Aortic valve:   Trileaflet; severely thickened, severely calcified leaflets.  Noncoronary cusp mobility was restricted.  Doppler: There was severe stenosis.    There was mild to moderate regurgitation.    VTI ratio of LVOT to aortic valve: 0.36. Valve area (VTI): 1.02 cm^2. Indexed valve area (VTI): 0.47 cm^2/m^2. Peak velocity ratio of LVOT to aortic valve: 0.36. Valve area (Vmax): 1.02 cm^2. Indexed valve area (Vmax): 0.47 cm^2/m^2. Mean velocity ratio of LVOT to aortic valve: 0.38. Valve area (Vmean): 1.09 cm^2. Indexed valve  area (Vmean): 0.5 cm^2/m^2.    Mean gradient (S): 43 mm Hg. Peak gradient (S): 81 mm Hg.  ------------------------------------------------------------------- Aorta:  Aortic root: The aortic root was normal in size.  ------------------------------------------------------------------- Mitral valve:   Severely calcified annulus. Mobility was not restricted.  Doppler:  Transvalvular velocity was within the normal range. There was no evidence for stenosis. There was no regurgitation.    Peak gradient (D): 6 mm Hg.  ------------------------------------------------------------------- Left atrium:  The atrium was moderately dilated.  ------------------------------------------------------------------- Atrial septum:  No defect or patent foramen ovale was identified by color flow Doppler.  ------------------------------------------------------------------- Right ventricle:  The cavity size was normal. Wall thickness was normal. Systolic function was normal.  ------------------------------------------------------------------- Pulmonic valve:    Structurally normal valve.   Cusp separation was normal.  Doppler:  Transvalvular velocity was within the normal range. There was no evidence for stenosis. There was no regurgitation.  ------------------------------------------------------------------- Tricuspid valve:   Structurally normal valve.    Doppler: Transvalvular velocity was within the normal range. There was mild regurgitation.  ------------------------------------------------------------------- Pulmonary  artery:   The main pulmonary artery was normal-sized. Systolic pressure was within the normal range.  ------------------------------------------------------------------- Right atrium:  The atrium was normal in size.  ------------------------------------------------------------------- Pericardium:  There was no pericardial effusion.  ------------------------------------------------------------------- Systemic veins: Inferior vena cava: The vessel was normal in size. The respirophasic diameter changes were in the normal range (>= 50%), consistent with normal central venous pressure.  ------------------------------------------------------------------- Measurements   Left ventricle                            Value          Reference  LV ID, ED, PLAX chordal           (L)     32.7  mm       43 - 52  LV ID, ES, PLAX chordal           (L)     21.1  mm       23 - 38  LV fx shortening, PLAX chordal            35    %        >=29  LV PW thickness, ED                       12.3  mm       ---------  IVS/LV PW ratio, ED               (H)     1.7            <=1.3  Stroke volume, 2D                         94    ml       ---------  Stroke volume/bsa, 2D                     43    ml/m^2   ---------  LV e&', lateral                            4.48  cm/s     ---------  LV E/e&', lateral  26.34          ---------  LV e&', medial                             3.9   cm/s     ---------  LV E/e&', medial                           30.26          ---------  LV e&', average                            4.19  cm/s     ---------  LV E/e&', average                          28.16          ---------    Ventricular septum                        Value          Reference  IVS thickness, ED                         20.96 mm       ---------    LVOT                                      Value          Reference  LVOT ID, S                                19    mm       ---------  LVOT area                                  2.84  cm^2     ---------  LVOT ID                                   19    mm       ---------  LVOT peak velocity, S                     162   cm/s     ---------  LVOT mean velocity, S                     114   cm/s     ---------  LVOT VTI, S                               33.1  cm       ---------  LVOT peak gradient, S                     10    mm Hg    ---------  Stroke volume (SV), LVOT DP  93.8  ml       ---------  Stroke index (SV/bsa), LVOT DP            42.8  ml/m^2   ---------    Aortic valve                              Value          Reference  Aortic valve peak velocity, S             450   cm/s     ---------  Aortic valve mean velocity, S             298   cm/s     ---------  Aortic valve VTI, S                       92.1  cm       ---------  Aortic mean gradient, S                   43    mm Hg    ---------  Aortic peak gradient, S                   81    mm Hg    ---------  VTI ratio, LVOT/AV                        0.36           ---------  Aortic valve area, VTI                    1.02  cm^2     ---------  Aortic valve area/bsa, VTI                0.47  cm^2/m^2 ---------  Velocity ratio, peak, LVOT/AV             0.36           ---------  Aortic valve area, peak velocity          1.02  cm^2     ---------  Aortic valve area/bsa, peak               0.47  cm^2/m^2 ---------  velocity  Velocity ratio, mean, LVOT/AV             0.38           ---------  Aortic valve area, mean velocity          1.09  cm^2     ---------  Aortic valve area/bsa, mean               0.5   cm^2/m^2 ---------  velocity  Aortic regurg pressure half-time          463   ms       ---------    Aorta                                     Value          Reference  Aortic root ID, ED                        30    mm       ---------  Ascending aorta ID, A-P, S                34    mm       ---------    Left atrium                               Value          Reference   LA ID, A-P, ES                            36    mm       ---------  LA ID/bsa, A-P                            1.64  cm/m^2   <=2.2  LA volume, S                              71    ml       ---------  LA volume/bsa, S                          32.4  ml/m^2   ---------  LA volume, ES, 1-p A4C                    78    ml       ---------  LA volume/bsa, ES, 1-p A4C                35.6  ml/m^2   ---------  LA volume, ES, 1-p A2C                    64    ml       ---------  LA volume/bsa, ES, 1-p A2C                29.2  ml/m^2   ---------    Mitral valve                              Value          Reference  Mitral E-wave peak velocity               118   cm/s     ---------  Mitral A-wave peak velocity               172   cm/s     ---------  Mitral deceleration time          (H)     553   ms       150 - 230  Mitral peak gradient, D                   6     mm Hg    ---------  Mitral E/A ratio, peak                    0.7            ---------    Pulmonary arteries                        Value  Reference  PA pressure, S, DP                (H)     31    mm Hg    <=30    Tricuspid valve                           Value          Reference  Tricuspid regurg peak velocity            265   cm/s     ---------  Tricuspid peak RV-RA gradient             28    mm Hg    ---------    Systemic veins                            Value          Reference  Estimated CVP                             3     mm Hg    ---------    Right ventricle                           Value          Reference  RV pressure, S, DP                (H)     31    mm Hg    <=30  RV s&', lateral, S                         15.8  cm/s     ---------  Legend: (L)  and  (H)  mark values outside specified reference range.  ------------------------------------------------------------------- Prepared and Electronically Authenticated by  Skeet Latch, MD 2018-05-08T17:02:56   Left Heart Cath and Coronary Angiography    Conclusion   1. Known severe aortic stenosis with peak and mean transvalvular gradients by cardiac catheterization of 48 and 32 mmHg, respectively  2. Severe single-vessel coronary artery disease involving the left circumflex  3. Mild nonobstructive disease involving the RCA and LAD  4. Severely calcified aortic valve with restricted aortic valve leaflet mobility and severe mitral annular calcification  Recommendations: Patient to go for cardiac surgical evaluation for consideration of aortic valve replacement plus/minus grafting of the first OM  Indications   Severe aortic stenosis [I35.0 (ICD-10-CM)]  Procedural Details/Technique   Technical Details INDICATION: Severe aortic stenosis (pre-op study)  PROCEDURAL DETAILS: The right wrist was prepped, draped, and anesthetized with 1% lidocaine. Using the modified Seldinger technique, a 5/6 French Slender sheath was introduced into the right radial artery. 3 mg of verapamil was administered through the sheath, weight-based unfractionated heparin was administered intravenously. Standard Judkins catheters were used for selective coronary angiography. Left ventricular pressure is recorded with a JR4 catheter. A pullback gradient is measured across the aortic valve. Catheter exchanges were performed over an exchange length guidewire. There were no immediate procedural complications. A TR band was used for radial hemostasis at the completion of the procedure. The patient was transferred to the post catheterization recovery area for further monitoring.    Estimated blood loss <50 mL.  During this procedure the patient was  administered the following to achieve and maintain moderate conscious sedation: Versed 2 mg, Fentanyl 25 mcg, while the patient's heart rate, blood pressure, and oxygen saturation were continuously monitored. The period of conscious sedation was 31 minutes, of which I was present face-to-face 100% of this time.    Coronary  Findings   Dominance: Right  Left Main  The vessel exhibits minimal luminal irregularities.  Left Anterior Descending  Prox LAD to Mid LAD lesion, 40% stenosed. The lesion is calcified.  Left Circumflex  Prox Cx lesion, 80% stenosed. The lesion is eccentric. The lesion is moderately calcified. There is a tight eccentric lesion in the proximal circumflex just before the bifurcation of the AV circumflex and the first obtuse marginal branch. The lesion is difficult to appreciate in most views but is well demonstrated on the caudal and RAO caudal views.  Mid Cx lesion, 50% stenosed. The lesion is eccentric.  Right Coronary Artery  The RCA is a dominant vessel. It is moderately calcified throughout. There is no high-grade obstruction.  Prox RCA lesion, 30% stenosed.  Mid RCA lesion, 30% stenosed. The lesion is eccentric.  First Right Posterolateral  Vessel is small in size.  1st RPLB lesion, 40% stenosed.  Left Heart   Mitral Valve The annulus is calcified.    Aortic Valve The aortic valve is calcified. There is restricted aortic valve motion.    Coronary Diagrams   Diagnostic Diagram       Implants     No implant documentation for this case.  PACS Images   Show images for Cardiac catheterization   Link to Procedure Log   Procedure Log    Hemo Data    Most Recent Value  Aortic Mean Gradient 32.3 mmHg  Aortic Peak Gradient 48 mmHg  AO Systolic Pressure 710 mmHg  AO Diastolic Pressure 67 mmHg  AO Mean 94 mmHg  LV Systolic Pressure 626 mmHg  LV Diastolic Pressure 1 mmHg  LV EDP 11 mmHg  Arterial Occlusion Pressure Extended Systolic Pressure 948 mmHg  Arterial Occlusion Pressure Extended Diastolic Pressure 59 mmHg  Arterial Occlusion Pressure Extended Mean Pressure 84 mmHg  Left Ventricular Apex Extended Systolic Pressure 546 mmHg  Left Ventricular Apex Extended Diastolic Pressure 0 mmHg  Left Ventricular Apex Extended EDP Pressure 9 mmHg     Cardiac TAVR  CT  TECHNIQUE: The patient was scanned on a Siemens 128 scanner. A 120 kV retrospective scan was triggered in the ascending thoracic aorta at 120 HU's. Gantry rotation speed was 300 msecs and collimation was .85mm. 5mg  of lopressor given. The 3D data set was reconstructed in 5% intervals of the R-R cycle. Systolic and diastolic phases were analyzed on a dedicated work station using MPR, MIP and VRT modes. The patient received 80 cc of contrast.  FINDINGS: Aortic Valve: Tri leaflet and severely calcified with restricted motion Mild calcification of the STJ and aortic atherosclerosis  Aorta: Mild calcific atherosclerosis with normal origin of the great vessels  Sinotubular Junction:  26 mm  Ascending Thoracic Aorta:  33 mm  Aortic Arch:  24 mm  Descending Thoracic Aorta:  22 mm  Sinus of Valsalva Measurements:  Non-coronary:  27 mm  Right -coronary:  27 mm  Left -coronary:  26 mm  Coronary Artery Height above Annulus:  Left Main:  Somewhat shallow at 9.8 mm  Right Coronary:  12.5 mm  Virtual Basal Annulus Measurements:  Maximum/Minimum Diameter:  20 mm x 25.5 mm  Perimeter:  72 mm  Area:  376 mm2  Coronary Arteries:  LM somewhat shallow in regard to deployment  Optimum Fluoroscopic Angle for Delivery:  LAO 11 CAU 12 degrees  IMPRESSION: 1) Severely calcified tri leaflet aortic valve with annulus suitable for a 23 mm Sapien 3 valve  2) Large nodular area of annular calcification at the base of the left cusp that may increase the risk of peri valvular regurgitation  3) LM some what shallow only 9.8 mm above annulus  4) No LAA thrombus  5) Optimum angle for deployment LAO 11 CAU 12 degrees  Jenkins Rouge   Electronically Signed   By: Jenkins Rouge M.D.   On: 02/14/2017 19:42   CT ANGIOGRAPHY CHEST, ABDOMEN AND PELVIS  TECHNIQUE: Multidetector CT imaging through the chest, abdomen and pelvis was performed using the  standard protocol during bolus administration of intravenous contrast. Multiplanar reconstructed images and MIPs were obtained and reviewed to evaluate the vascular anatomy.  CONTRAST:  100 mL of Isovue 370.  COMPARISON:  No priors.  FINDINGS: CTA CHEST FINDINGS  Cardiovascular: Heart size is normal. There is no significant pericardial fluid, thickening or pericardial calcification. There is aortic atherosclerosis, as well as atherosclerosis of the great vessels of the mediastinum and the coronary arteries, including calcified atherosclerotic plaque in the left anterior descending, left circumflex and right coronary arteries. Severe thickening calcification of the aortic valve, mitral annulus and posterior leaflet of the mitral valve.  Mediastinum/Lymph Nodes: No pathologically enlarged mediastinal or hilar lymph nodes. Hiatal hernia containing some retroperitoneal fat as well as a trace volume of fluid adjacent to the distal third of the esophagus. Small sliding-type hiatal hernia of the esophagus also noted. No axillary lymphadenopathy.  Lungs/Pleura: No acute consolidative airspace disease. No pleural effusions. There multiple tiny pulmonary nodules scattered throughout both lungs measuring 4 mm or less in size which are nonspecific. No larger more suspicious appearing pulmonary nodules or masses are noted.  Musculoskeletal/Soft Tissues: Old healed fractures of the anterior aspects of the left third, fourth and fifth ribs are incidentally noted. There are no aggressive appearing lytic or blastic lesions noted in the visualized portions of the skeleton.  CTA ABDOMEN AND PELVIS FINDINGS  Hepatobiliary: No cystic or solid hepatic lesions. No intra or extrahepatic biliary ductal dilatation. Gallbladder is normal in appearance.  Pancreas: No pancreatic mass. No pancreatic ductal dilatation. No pancreatic or peripancreatic fluid or inflammatory changes.  Spleen:  Unremarkable.  Adrenals/Urinary Tract: Bilateral kidneys and bilateral adrenal glands are normal in appearance. There is no hydroureteronephrosis. Urinary bladder is normal in appearance.  Stomach/Bowel: The appearance of the stomach is normal. There is no pathologic dilatation of small bowel or colon. Several colonic diverticulae are noted, without surrounding inflammatory changes to suggest an acute diverticulitis at this time. Although there is a small appendicolith in the neck of the appendix, the appendix (retrocecal) is otherwise normal in size and appearance without surrounding inflammatory changes.  Vascular/Lymphatic: Aortic atherosclerosis with vascular findings and measurements pertinent to potential TAVR procedure, as detailed below. No aneurysm or dissection identified in the abdominal or pelvic vasculature. Celiac axis, superior mesenteric artery and inferior mesenteric artery are all widely patent without hemodynamically significant stenosis. Single renal arteries are widely patent bilaterally. No lymphadenopathy identified in the abdomen or pelvis.  Reproductive: Uterus and ovaries are atrophic.  Other: No significant volume of ascites. No pneumoperitoneum. Small umbilical hernia containing only omental fat.  Musculoskeletal: There are no aggressive appearing lytic or blastic lesions noted in the visualized portions of the  skeleton.  VASCULAR MEASUREMENTS PERTINENT TO TAVR:  AORTA:  Minimal Aortic Diameter -  13 x 13 mm  Severity of Aortic Calcification -  mild  RIGHT PELVIS:  Right Common Iliac Artery -  Minimal Diameter - 9.5 x 8.3 mm  Tortuosity - mild  Calcification - mild  Right External Iliac Artery -  Minimal Diameter - 7.2 x 6.8 mm  Tortuosity - severe  Calcification - none  Right Common Femoral Artery -  Minimal Diameter - 8.6 x 7.4 mm  Tortuosity - mild  Calcification - none  LEFT PELVIS:  Left  Common Iliac Artery -  Minimal Diameter - 8.4 x 7.0 mm  Tortuosity - mild  Calcification - mild  Left External Iliac Artery -  Minimal Diameter - 7.1 x 7.3 mm  Tortuosity - severe  Calcification - none  Left Common Femoral Artery -  Minimal Diameter - 7.2 x 7.5 mm  Tortuosity - mild  Calcification - none  Review of the MIP images confirms the above findings.  IMPRESSION: 1. Vascular findings and measurements pertinent to potential TAVR procedure, as detailed above. This patient appears to have suitable pelvic arterial access bilaterally. 2. Severe thickening calcification of the aortic valve, compatible with the reported clinical history of severe aortic stenosis. 3. There is also extensive calcification of the mitral annulus and posterior leaflet of the mitral valve. 4. Aortic atherosclerosis, in addition to 3 vessel coronary artery disease. Assessment for potential risk factor modification, dietary therapy or pharmacologic therapy may be warranted, if clinically indicated. 5. Colonic diverticulosis without evidence of acute diverticulitis at this time. 6. Additional incidental findings, as above.  Aortic Atherosclerosis (ICD10-I70.0).   Electronically Signed   By: Vinnie Langton M.D.   On: 03/19/2017 15:12    Impression:  Patient has at least stage CI severe asymptomatic aortic stenosis. 2 months ago she experienced an episode of exertional shortness breath and chest discomfort when she was with her daughter and upset about something personal. She was walking at a brisk pace at that time. Symptoms subsided with rest. She otherwise denies symptoms of exertional shortness of breath or chest discomfort. She has had some dizzy spells attributed to vertigo with no history of syncope. I have personally reviewed the patient's recent transthoracic echocardiogram, diagnostic cardiac catheterization, and CT angiograms. Echocardiogram demonstrated the  presence of severe aortic stenosis with fairly rapid progression of disease over the past couple of years. Most recent echocardiogram demonstrates the presence of a trileaflet aortic valve with severe thickening, calcification, and restricted leaflet mobility involving all 3 leaflets. Peak velocity across the aortic valve measured greater than 4.5 m/s corresponding to mean transvalvular gradient estimated 41 mmHg. Left ventricular systolic function remains normal. Diagnostic cardiac catheterization confirmed the presence of aortic stenosis and was also notable for the presence of significant coronary artery disease with 80% stenosis of the left circumflex coronary artery prior to takeoff of a large obtuse marginal branch. At this juncture options could include continued medical therapy with close follow-up, exercise stress testing to confirm symptomatic status, or perhaps surgical intervention to consider conventional surgical aortic valve replacement with coronary artery bypass grafting versus transcatheter aortic valve replacement with continued medical therapy for management of the patient's coronary artery disease.  Cardiac-gated CTA of the heart reveals anatomical characteristics consistent with aortic stenosis suitable for treatment by transcatheter aortic valve replacement without any significant complicating features, although the patient's aortic annulus and aortic root is relatively small in size. I agree that risks associated  with conventional surgical aortic valve replacement the patient would likely be at least moderately elevated and considerably higher than those predicted using the STS risk calculator because of the need for aortic root enlargement or root replacement in order to avoid the development of patient prosthesis mismatch. Hemodynamic results following transcatheter aortic valve replacement would likely be better with a super annular valve such as the Medtronic Evolute valve.   CTA of the  aorta and iliac vessels demonstrate what appears to be adequate pelvic vascular access to facilitate a transfemoral approach.    Plan:  The patient and her husband were counseled at length regarding treatment alternatives for management of severe symptomatic aortic stenosis. Alternative approaches such as conventional aortic valve replacement, transcatheter aortic valve replacement, and continued medical therapy were compared and contrasted at length.  The importance of the presence of symptoms in the setting of patients with severe aortic stenosis was discussed as was the potential utility of stress testing in patients who lives a sedentary lifestyle. The risks associated with conventional surgical aortic valve replacement were been discussed in detail, as were expectations for post-operative convalescence.  Issues specific to transcatheter aortic valve replacement were discussed including questions about long term valve durability, the potential for paravalvular leak, possible increased risk of need for permanent pacemaker placement, and other technical complications related to the procedure itself.  Long-term prognosis with medical therapy was discussed. This discussion was placed in the context of the patient's own specific clinical presentation and past medical history.  All of their questions been addressed.  The patient desires to proceed with transcatheter aortic valve replacement as previously scheduled.  Following the decision to proceed with transcatheter aortic valve replacement, a discussion has been held regarding what types of management strategies would be attempted intraoperatively in the event of life-threatening complications, including whether or not the patient would be considered a candidate for the use of cardiopulmonary bypass and/or conversion to open sternotomy for attempted surgical intervention.  The patient has been advised of a variety of complications that might develop  including but not limited to risks of death, stroke, paravalvular leak, aortic dissection or other major vascular complications, aortic annulus rupture, device embolization, cardiac rupture or perforation, mitral regurgitation, acute myocardial infarction, arrhythmia, heart block or bradycardia requiring permanent pacemaker placement, congestive heart failure, respiratory failure, renal failure, pneumonia, infection, other late complications related to structural valve deterioration or migration, or other complications that might ultimately cause a temporary or permanent loss of functional independence or other long term morbidity.  The patient provides full informed consent for the procedure as described and all questions were answered.   I spent in excess of 90 minutes during the conduct of this office consultation and >50% of this time involved direct face-to-face encounter with the patient for counseling and/or coordination of their care.    Valentina Gu. Roxy Manns, MD 04/17/2017 3:16 PM

## 2017-04-17 NOTE — Pre-Procedure Instructions (Signed)
DORA SIMEONE  04/17/2017      [place], [address] [place] [address] Phone: [phone] Fax: [phone]  [id number] [address], [address] [place] [address] Phone: [phone] Fax: [phone]    Your procedure is scheduled on August 21  Report to [facility] at 1000 A.M.  Call this number if you have problems the morning of surgery:  [phone]   Remember:  Do not eat food or drink liquids after midnight.  Continue all other medications as directed by your physician except follow these instructions about you medications   Take these medicines the morning of surgery with A SIP OF WATER NONE  7 days prior to surgery STOP taking any Aleve, Naproxen, Ibuprofen, Motrin, Advil, Goody's, BC's, all herbal medications, fish oil, and all vitamins  Follow your doctors instructions regarding your Aspirin.  If no instructions were given by the doctor you will need to call the office to get instructions.  Your pre admission RN will also call for those instructions    Do not wear jewelry, make-up or nail polish.  Do not wear lotions, powders, or perfumes, or deoderant.  Do not shave 48 hours prior to surgery.  Men may shave face and neck.  Do not bring valuables to the hospital.  [facility] is not responsible for any belongings or valuables.  Contacts, dentures or bridgework may not be worn into surgery.  Leave your suitcase in the car.  After surgery it may be brought to your room.  For patients admitted to the hospital, discharge time will be determined by your treatment team.  Patients discharged the day of surgery will not be allowed to drive home.    Special instructions:   [facility]- Preparing For Surgery  Before surgery, you can play an important role. Because skin is not sterile, your skin needs to be as free of germs as possible. You can reduce the  number of germs on your skin by washing with CHG (chlorahexidine gluconate) Soap before surgery.  CHG is an antiseptic cleaner which kills germs and bonds with the skin to continue killing germs even after washing.  Please do not use if you have an allergy to CHG or antibacterial soaps. If your skin becomes reddened/irritated stop using the CHG.  Do not shave (including legs and underarms) for at least 48 hours prior to first CHG shower. It is OK to shave your face.  Please follow these instructions carefully.   1. Shower the NIGHT BEFORE SURGERY and the MORNING OF SURGERY with CHG.   2. If you chose to wash your hair, wash your hair first as usual with your normal shampoo.  3. After you shampoo, rinse your hair and body thoroughly to remove the shampoo.  4. Use CHG as you would any other liquid soap. You can apply CHG directly to the skin and wash gently with a scrungie or a clean washcloth.   5. Apply the CHG Soap to your body ONLY FROM THE NECK DOWN.  Do not use on open wounds or open sores. Avoid contact with your eyes, ears, mouth and genitals (private parts). Wash genitals (private parts) with your normal soap.  6. Wash thoroughly, paying special attention to the area where your surgery will be performed.  7. Thoroughly rinse your body with warm water from the neck down.  8. DO NOT shower/wash with your  normal soap after using and rinsing off the CHG Soap.  9. Pat yourself dry with a CLEAN TOWEL.   10. Wear CLEAN PAJAMAS   11. Place CLEAN SHEETS on your bed the night of your first shower and DO NOT SLEEP WITH PETS.    Day of Surgery: Do not apply any deodorants/lotions. Please wear clean clothes to the hospital/surgery center.      Please read over the following fact sheets that you were given.

## 2017-04-17 NOTE — Patient Instructions (Signed)
  Continue taking all current medications without change through the day before surgery.  Have nothing to eat or drink after midnight the night before surgery.  On the morning of surgery do not take any of your regular medications.    

## 2017-04-18 ENCOUNTER — Encounter: Payer: Medicare Other | Admitting: Thoracic Surgery (Cardiothoracic Vascular Surgery)

## 2017-04-18 ENCOUNTER — Ambulatory Visit (HOSPITAL_COMMUNITY)
Admission: RE | Admit: 2017-04-18 | Discharge: 2017-04-18 | Disposition: A | Discharge status: Home / Self Care | Payer: Medicare Other | Source: Ambulatory Visit | Attending: Cardiovascular Disease | Admitting: Cardiovascular Disease

## 2017-04-18 ENCOUNTER — Telehealth: Payer: Self-pay

## 2017-04-18 ENCOUNTER — Encounter (HOSPITAL_COMMUNITY)
Admission: RE | Admit: 2017-04-18 | Discharge: 2017-04-18 | Disposition: A | Discharge status: Home / Self Care | Payer: Medicare Other | Source: Ambulatory Visit | Attending: Cardiovascular Disease | Admitting: Cardiovascular Disease

## 2017-04-18 ENCOUNTER — Encounter (HOSPITAL_COMMUNITY): Payer: Self-pay

## 2017-04-18 DIAGNOSIS — I350 Nonrheumatic aortic (valve) stenosis: Secondary | ICD-10-CM | POA: Insufficient documentation

## 2017-04-18 DIAGNOSIS — Z952 Presence of prosthetic heart valve: Secondary | ICD-10-CM | POA: Diagnosis not present

## 2017-04-18 DIAGNOSIS — Z01818 Encounter for other preprocedural examination: Secondary | ICD-10-CM | POA: Insufficient documentation

## 2017-04-18 LAB — ABO/RH: ABO/RH(D): A POS

## 2017-04-18 LAB — URINALYSIS, ROUTINE W REFLEX MICROSCOPIC
BILIRUBIN URINE: NEGATIVE
Glucose, UA: NEGATIVE mg/dL
HGB URINE DIPSTICK: NEGATIVE
Ketones, ur: NEGATIVE mg/dL
Nitrite: NEGATIVE
PH: 5 (ref 5.0–8.0)
Protein, ur: NEGATIVE mg/dL
SPECIFIC GRAVITY, URINE: 1.015 (ref 1.005–1.030)

## 2017-04-18 LAB — COMPREHENSIVE METABOLIC PANEL
ALBUMIN: 3.8 g/dL (ref 3.5–5.0)
ALT: 21 U/L (ref 14–54)
AST: 20 U/L (ref 15–41)
Alkaline Phosphatase: 138 U/L — ABNORMAL HIGH (ref 38–126)
Anion gap: 13 (ref 5–15)
BUN: 14 mg/dL (ref 6–20)
CHLORIDE: 103 mmol/L (ref 101–111)
CO2: 21 mmol/L — ABNORMAL LOW (ref 22–32)
Calcium: 9.1 mg/dL (ref 8.9–10.3)
Creatinine, Ser: 0.6 mg/dL (ref 0.44–1.00)
GFR calc Af Amer: >60 mL/min (ref >60)
GFR calc non Af Amer: >60 mL/min (ref >60)
GLUCOSE: 119 mg/dL — AB (ref 65–99)
POTASSIUM: 3.9 mmol/L (ref 3.5–5.1)
SODIUM: 137 mmol/L (ref 135–145)
Total Bilirubin: 0.9 mg/dL (ref 0.3–1.2)
Total Protein: 7.1 g/dL (ref 6.5–8.1)

## 2017-04-18 LAB — CBC
HCT: 39.8 % (ref 36.0–46.0)
Hemoglobin: 13.2 g/dL (ref 12.0–15.0)
MCH: 28.4 pg (ref 26.0–34.0)
MCHC: 33.2 g/dL (ref 30.0–36.0)
MCV: 85.8 fL (ref 78.0–100.0)
PLATELETS: 244 10*3/uL (ref 150–400)
RBC: 4.64 MIL/uL (ref 3.87–5.11)
RDW: 14.5 % (ref 11.5–15.5)
WBC: 7.5 10*3/uL (ref 4.0–10.5)

## 2017-04-18 LAB — BLOOD GAS, ARTERIAL
ACID-BASE EXCESS: 1.6 mmol/L (ref 0.0–2.0)
BICARBONATE: 25.3 mmol/L (ref 20.0–28.0)
DRAWN BY: 470591
FIO2: 21
O2 Saturation: 92.3 %
PATIENT TEMPERATURE: 98.6
pCO2 arterial: 37.7 mmHg (ref 32.0–48.0)
pH, Arterial: 7.443 (ref 7.350–7.450)
pO2, Arterial: 63 mmHg — ABNORMAL LOW (ref 83.0–108.0)

## 2017-04-18 LAB — PROTIME-INR
INR: 0.98
Prothrombin Time: 13 seconds (ref 11.4–15.2)

## 2017-04-18 LAB — TYPE AND SCREEN
ABO/RH(D): A POS
Antibody Screen: NEGATIVE

## 2017-04-18 LAB — SURGICAL PCR SCREEN
MRSA, PCR: NEGATIVE
STAPHYLOCOCCUS AUREUS: POSITIVE — AB

## 2017-04-18 LAB — HEMOGLOBIN A1C
Hgb A1c MFr Bld: 7 % — ABNORMAL HIGH (ref 4.8–5.6)
Mean Plasma Glucose: 154.2 mg/dL

## 2017-04-18 LAB — APTT: APTT: 32 s (ref 24–36)

## 2017-04-18 NOTE — Progress Notes (Signed)
SPOKE WITH RYAN BROOKS WHO STATED PATIENT COULD CONTINUE TAKING ASPIRIN 81 MG.

## 2017-04-18 NOTE — Pre-Procedure Instructions (Addendum)
Debra Knight  04/18/2017      [place], [address] [place] [address] Phone: [phone] Fax: [phone]  [id number] [address], [address] [address] Phone: [phone] Fax: [phone]    Your procedure is scheduled on   Tuesday 04/23/17  Report to [facility] Admitting at  1030 A.M.  Call this number if you have problems the morning of surgery:  [phone]   Remember:  Do not eat food or drink liquids after midnight.  Take these medicines the morning of surgery with A SIP OF WATER -  NONE  7 days prior to surgery STOP taking any  Aleve, Naproxen, Ibuprofen, Motrin, Advil, Goody's, BC's, all herbal medications, fish oil, and all vitamins   Do not wear jewelry, make-up or nail polish.  Do not wear lotions, powders, or perfumes, or deoderant.  Do not shave 48 hours prior to surgery.  Men may shave face and neck.  Do not bring valuables to the hospital.  [facility] is not responsible for any belongings or valuables.  Contacts, dentures or bridgework may not be worn into surgery.  Leave your suitcase in the car.  After surgery it may be brought to your room.  For patients admitted to the hospital, discharge time will be determined by your treatment team.  Patients discharged the day of surgery will not be allowed to drive home.   Name and phone number of your driver:    Special instructions:  [facility] - Preparing for Surgery  Before surgery, you can play an important role.  Because skin is not sterile, your skin needs to be as free of germs as possible.  You can reduce the number of germs on you skin by washing with CHG (chlorahexidine gluconate) soap before surgery.  CHG is an antiseptic cleaner which kills germs and bonds with the skin to continue killing germs even after washing.  Please DO NOT use if you have an allergy to CHG or antibacterial soaps.  If  your skin becomes reddened/irritated stop using the CHG and inform your nurse when you arrive at Short Stay.  Do not shave (including legs and underarms) for at least 48 hours prior to the first CHG shower.  You may shave your face.  Please follow these instructions carefully:   1.  Shower with CHG Soap the night before surgery and the                                morning of Surgery.  2.  If you choose to wash your hair, wash your hair first as usual with your       normal shampoo.  3.  After you shampoo, rinse your hair and body thoroughly to remove the                      Shampoo.  4.  Use CHG as you would any other liquid soap.  You can apply chg directly       to the skin and wash gently with scrungie or a clean washcloth.  5.  Apply the CHG Soap to your body ONLY FROM THE NECK DOWN.        Do not use on open wounds or open sores.  Avoid contact with your eyes,  ears, mouth and genitals (private parts).  Wash genitals (private parts)       with your normal soap.  6.  Wash thoroughly, paying special attention to the area where your surgery        will be performed.  7.  Thoroughly rinse your body with warm water from the neck down.  8.  DO NOT shower/wash with your normal soap after using and rinsing off       the CHG Soap.  9.  Pat yourself dry with a clean towel.            10.  Wear clean pajamas.            11.  Place clean sheets on your bed the night of your first shower and do not        sleep with pets.  Day of Surgery  Do not apply any lotions/deoderants the morning of surgery.  Please wear clean clothes to the hospital/surgery center.    Please read over the following fact sheets that you were given. MRSA Information and Surgical Site Infection Prevention

## 2017-04-18 NOTE — Telephone Encounter (Signed)
Called Dr. Dorian Heckle office. No one was available to speak. Left message regarding what I was calling about with my name and number. Advised that I would be out of the office tomorrow [phone] and for them to ask for Dr. Johnsie Cancel assistant.

## 2017-04-18 NOTE — Telephone Encounter (Signed)
Please advise,   Patient is having several teeth removed, as well as undergoing intravenous sedation for alveoloplasty and periodontal cleaning. Dr. Buelah Manis, DDS has asked that we advise on any recommendations before they undergo the procedures. Patient reported to Dr. Buelah Manis, DDS that she has a history of aortic stenosis and is currently scheduled for an aortic valve replacement on 04/23/17. Please advise on what I need to let Dr. Buelah Manis know before they perform the dental procedures.   Thank you,  Corrine CMA AAMA

## 2017-04-18 NOTE — Telephone Encounter (Signed)
No special precautions other then use as little epinephrine as possible

## 2017-04-19 NOTE — Progress Notes (Signed)
error 

## 2017-04-22 MED ORDER — DOPAMINE-DEXTROSE 3.2-5 MG/ML-% IV SOLN
0.0000 ug/kg/min | INTRAVENOUS | Status: DC
  Filled 2017-04-22: qty 250

## 2017-04-22 MED ORDER — SODIUM CHLORIDE 0.9 % IV SOLN
INTRAVENOUS | Status: DC
  Filled 2017-04-22: qty 1

## 2017-04-22 MED ORDER — SODIUM CHLORIDE 0.9 % IV SOLN
30.0000 ug/min | INTRAVENOUS | Status: DC
  Filled 2017-04-22: qty 2

## 2017-04-22 MED ORDER — MAGNESIUM SULFATE 50 % IJ SOLN
40.0000 meq | INTRAMUSCULAR | Status: DC
  Filled 2017-04-22: qty 10

## 2017-04-22 MED ORDER — LEVOFLOXACIN IN D5W 500 MG/100ML IV SOLN
500.0000 mg | INTRAVENOUS | Status: AC
  Administered 2017-04-23: 500 mg via INTRAVENOUS
  Filled 2017-04-22: qty 100

## 2017-04-22 MED ORDER — DEXMEDETOMIDINE HCL IN NACL 400 MCG/100ML IV SOLN
0.1000 ug/kg/h | INTRAVENOUS | Status: AC
  Administered 2017-04-23: 0.7 ug/kg/h via INTRAVENOUS
  Filled 2017-04-22: qty 100

## 2017-04-22 MED ORDER — EPINEPHRINE PF 1 MG/ML IJ SOLN
0.0000 ug/min | INTRAVENOUS | Status: DC
  Filled 2017-04-22: qty 4

## 2017-04-22 MED ORDER — NITROGLYCERIN IN D5W 200-5 MCG/ML-% IV SOLN
2.0000 ug/min | INTRAVENOUS | Status: DC
  Filled 2017-04-22: qty 250

## 2017-04-22 MED ORDER — NOREPINEPHRINE BITARTRATE 1 MG/ML IV SOLN
0.0000 ug/min | INTRAVENOUS | Status: AC
  Administered 2017-04-23: 1 ug/min via INTRAVENOUS
  Filled 2017-04-22: qty 4

## 2017-04-22 MED ORDER — VANCOMYCIN HCL 10 G IV SOLR
1500.0000 mg | INTRAVENOUS | Status: AC
  Administered 2017-04-23: 1500 mg via INTRAVENOUS
  Filled 2017-04-22: qty 1500

## 2017-04-22 MED ORDER — POTASSIUM CHLORIDE 2 MEQ/ML IV SOLN
80.0000 meq | INTRAVENOUS | Status: DC
  Filled 2017-04-22: qty 40

## 2017-04-22 MED ORDER — SODIUM CHLORIDE 0.9 % IV SOLN
INTRAVENOUS | Status: DC
  Filled 2017-04-22: qty 30

## 2017-04-22 NOTE — Progress Notes (Signed)
Anesthesia Chart Review:  Pt is a 71 year old female scheduled for TAVR on 04/23/2017 with [name], MD  PMH includes:  Aortic stenosis, OSA, GERD.  Former smoker. BMI 46. S/p L TKA 02/20/16. S/p R TKA 12/13/14.   - Pt underwent dental procedures in preparation for TAVR with Dr. Buelah Manis, [place] on 04/19/17.   Medications include: ASA 81mg , lipitor  Preoperative labs reviewed.   - ABG with pO2 63.0 - UA with WBCs too numerous to count, few bacteria - HbA1c 7.0, glucose 119; I do not see DM in pt's history.  - I notified Ryan in Dr. Antionette Char office of these lab results.   CXR 04/18/17: No acute cardiopulmonary disease.  EKG 04/18/17: NSR  Carotid duplex 03/19/17:  - Bilateral - 1% to 39% ICA stenosis lower end of range.  - Vertebral artery flow is antegrade.  Cardiac cath 01/15/17:  1. Known severe aortic stenosis with peak and mean transvalvular gradients by cardiac catheterization of 48 and 32 mmHg, respectively 2. Severe single-vessel coronary artery disease involving the left circumflex 3. Mild nonobstructive disease involving the RCA and LAD 4. Severely calcified aortic valve with restricted aortic valve leaflet mobility and severe mitral annular calcification - Recommendations: Patient to go for cardiac surgical evaluation for consideration of aortic valve replacement plus/minus grafting of the first OM  Echo 01/08/17:  - Left ventricle: The cavity size was normal. There was severe focal basal and moderate concentric hypertrophy of the septum with otherwise mild concentric hypertrophy. Systolic function was normal. The estimated ejection fraction was in the range of 60% to 65%. There was dynamic obstruction at restin the mid cavity, with a peak velocity of 263 cm/sec and a peak gradient of 28 mmHg. Wall motion was normal; there were no regional wall motion abnormalities. Doppler parameters are consistent with abnormal left ventricular relaxation (grade 1 diastolic dysfunction). Doppler  parameters are consistent with high ventricular filling pressure. - Aortic valve: Noncoronary cusp mobility was restricted. There was severe stenosis. There was mild to moderate regurgitation. Regurgitation pressure half-time: 463 ms. - Mitral valve: Severely calcified annulus. Transvalvular velocity was within the normal range. There was no evidence for stenosis. There was no regurgitation. - Left atrium: The atrium was moderately dilated. - Right ventricle: The cavity size was normal. Wall thickness was normal. Systolic function was normal. - Atrial septum: No defect or patent foramen ovale was identified by color flow Doppler. - Tricuspid valve: There was mild regurgitation. - Pulmonary arteries: Systolic pressure was within the normal range. PA peak pressure: 31 mm Hg (S). - Impressions: Compared with the echo 11/2015, aortic stenosis is now severe.  If no changes, I anticipate pt can proceed with surgery as scheduled.   Willeen Cass, FNP-BC [facility]/Anesthesiology Phone: [phone] 04/22/2017 9:47 AM

## 2017-04-22 NOTE — H&P (Signed)
[address]       ,[place] [address]             [phone]      Cardiothoracic Surgery Admission History and Physical   PCP is none per patient Referring Provider is Jenkins Rouge, MD      Chief Complaint  Patient presents with  . Aortic Stenosis        HPI:  The patient is a 71 year old woman with a history of obesity, hyperlipidemia, OSA, DJD in her knees s/p bilateral TKR, and aortic stenosis that has been followed by Dr. Johnsie Cancel. Her most recent echo on 01/08/2017 showed progression to severe AS with a mean gradient of 43 mm Hg and a peak of 81 mm Hg. The indexed valve area is 0.5 cm2/m2. The LVEF was 60-65% with severe basal septal hypertrophy and mild concentric LVH. There was dynamic obstruction at rest in the mid-cavity with a peak velocity of 263 cm/sec and a peak gradient of 28 mm Hg. There was mild to moderate AI. Cardiac cath on 01/15/2017 showed a mean transvalvular gradient of 32 mm Hg and a peak of 48 mm Hg. There was an 80% proximal LCX stenosis before the OM1 and non-obstructive disease in the RCA and LAD.   She reports some bronchitis diagnosed yesterday with coughing and wheezing. She has had this before. At baseline she denies any shortness of breath or fatigue but is not very active due to her obesity and prior knee replacements. She denies and dizziness or syncope. She had had vertigo in the past which was visual and not lightheadedness.        Past Medical History:  Diagnosis Date  . Aortic stenosis    moderate on Echo 09/2013, Dr. Johnsie Cancel monitoring  . Carotid bruit    LICA 25-63% 04/9372 doppler "right"  . Enlarged thyroid   . Epiglottitis    hx of 40 years ago  . Family history of adverse reaction to anesthesia    "father- age 55 had confusion from anesthesia"  . GERD (gastroesophageal reflux disease)   . Heart murmur   . Hyperlipidemia   . OSA (obstructive sleep apnea)    no cpap   . Osteoarthritis of  both knees   . Seasonal allergies   . Tinnitus   . Vertigo 07/2014   8 weeks  . Vertigo          Past Surgical History:  Procedure Laterality Date  . BIOPSY THYROID  2015  . DILATION AND CURETTAGE OF UTERUS    . LEFT HEART CATH AND CORONARY ANGIOGRAPHY N/A 01/15/2017   Procedure: Left Heart Cath and Coronary Angiography;  Surgeon: [name], MD;  Location: [facility];  Service: Cardiovascular;  Laterality: N/A;  . TONSILLECTOMY  age 47  . TOTAL KNEE ARTHROPLASTY Right 12/13/2014   Procedure: RIGHT TOTAL KNEE ARTHROPLASTY;  Surgeon: Gaynelle Arabian, MD;  Location: [facility];  Service: Orthopedics;  Laterality: Right;  . TOTAL KNEE ARTHROPLASTY Left 02/20/2016   Procedure: LEFT TOTAL KNEE ARTHROPLASTY;  Surgeon: Gaynelle Arabian, MD;  Location: [facility];  Service: Orthopedics;  Laterality: Left;         Family History  Problem Relation Age of Onset  . Heart disease Mother        deceased at 30 MI  . Prostate cancer Father        deceased at 7  . Diabetes Maternal Grandfather   .  Diabetes Maternal Uncle   . Hashimoto's thyroiditis Daughter   . Hashimoto's thyroiditis Son     Social History       Social History  Substance Use Topics  . Smoking status: Former Smoker    Packs/day: 2.00    Years: 15.00    Types: Cigarettes    Quit date: 09/03/1968  . Smokeless tobacco: Never Used     Comment: 1-2 ppd.   . Alcohol use No          Current Outpatient Prescriptions  Medication Sig Dispense Refill  . aspirin EC 81 MG tablet Take 81 mg by mouth every evening.    Marland Kitchen BIOTIN PO Take 1 tablet by mouth daily.    Marland Kitchen doxycycline (VIBRAMYCIN) 100 MG capsule Take 100 mg by mouth 2 (two) times daily. X 10 DAYS, ENDING 02/13/17    . Multiple Vitamin (MULTIVITAMIN WITH MINERALS) TABS tablet Take 1 tablet by mouth daily.    . naproxen sodium (ANAPROX) 220 MG tablet Take 220 mg by mouth daily as needed (pain).     .  Pseudoephedrine-Guaifenesin (MUCINEX D PO) Take 1 capsule by mouth every 4 (four) hours as needed.    . Vitamin E 100 UNIT/GM CREA Apply 1 g topically daily as needed (scars).     Marland Kitchen atorvastatin (LIPITOR) 40 MG tablet TAKE ONE TABLET BY MOUTH ONCE DAILY 90 tablet 3   No current facility-administered medications for this visit.          Allergies  Allergen Reactions  . Advil [Ibuprofen] Anaphylaxis, Swelling and Other (See Comments)    Throat swelling per patient  . Penicillins Hives, Swelling and Other (See Comments)    Tongue swelling Has patient had a PCN reaction causing immediate rash, facial/tongue/throat swelling, SOB or lightheadedness with hypotension: yes Has patient had a PCN reaction causing severe rash involving mucus membranes or skin necrosis: no Has patient had a PCN reaction that required hospitalization no Has patient had a PCN reaction occurring within the last 10 years: no If all of the above answers are "NO", then may proceed with Cepha  . Demerol [Meperidine] Other (See Comments)    vomiting  . Other Hives and Other (See Comments)    Duck  . Cortisone Anxiety and Other (See Comments)    Loopy     Review of Systems  Constitutional: Negative for activity change, appetite change, fatigue, fever and unexpected weight change.  HENT: Positive for dental problem.        Some loose teeth that need extraction and has been told that she will need further reconstruction after that.  Eyes: Negative.   Respiratory: Positive for cough and wheezing. Negative for shortness of breath.   Cardiovascular: Negative for chest pain, palpitations and leg swelling.  Gastrointestinal: Negative.   Endocrine: Negative.   Genitourinary: Negative.   Musculoskeletal: Negative.   Skin: Negative.   Neurological: Negative for dizziness and syncope.  Hematological: Negative.   Psychiatric/Behavioral: Negative.     BP (!) 165/83 (BP Location: Right Arm, Patient  Position: Sitting, Cuff Size: Large)   Pulse 78   Resp 16   Ht 5\' 1"  (1.549 m)   Wt 235 lb (106.6 kg)   SpO2 95%   BMI 44.40 kg/m  Physical Exam  Constitutional: She is oriented to person, place, and time.  Obese woman in no distress  HENT:  Head: Normocephalic and atraumatic.  Mouth/Throat: Oropharynx is clear and moist.  Eyes: EOM are normal. Pupils are equal, round,  and reactive to light.  Neck: Normal range of motion. Neck supple. No JVD present. No thyromegaly present.  Cardiovascular: Normal rate and regular rhythm.   Murmur heard. 3/6 systolic murmur RSB with soft S2. No diastolic murmur  Pulmonary/Chest: Effort normal. No respiratory distress. She has wheezes.  Abdominal: Soft. Bowel sounds are normal. She exhibits no distension. There is no tenderness.  Musculoskeletal: Normal range of motion. She exhibits no edema.  Lymphadenopathy:    She has no cervical adenopathy.  Neurological: She is alert and oriented to person, place, and time. She has normal strength. No cranial nerve deficit or sensory deficit.  Skin: Skin is warm and dry.  Psychiatric: She has a normal mood and affect.     Diagnostic Tests:  [facility] [address], [place] [address] [phone]  ------------------------------------------------------------------- Transthoracic Echocardiography  Patient: Leighanne, Adolph MR #: [id number] Study Date: 01/08/2017 Gender: F Age: 80 Height: 154.9 cm Weight: 105.2 kg BSA: 2.19 m^2 Pt. Status: Room:  ATTENDING Jenkins Rouge, M.D. ORDERING Jenkins Rouge, M.D. REFERRING Jenkins Rouge, M.D. SONOGRAPHER Oletta Lamas, Will PERFORMING [facility], Outpatient  cc:  ------------------------------------------------------------------- LV EF: 60% -  65%  ------------------------------------------------------------------- Indications: (I35.0).  ------------------------------------------------------------------- History: PMH: Acquired from the patient and from the patient&'s chart. Aortic stenosis. Risk factors: Hypertension. Obese. Dyslipidemia.  ------------------------------------------------------------------- Study Conclusions  - Left ventricle: The cavity size was normal. There was severe focal basal and moderate concentric hypertrophy of the septum with otherwise mild concentric hypertrophy. Systolic function was normal. The estimated ejection fraction was in the range of 60% to 65%. There was dynamic obstruction at restin the mid cavity, with a peak velocity of 263 cm/sec and a peak gradient of 28 mm Hg. Wall motion was normal; there were no regional wall motion abnormalities. Doppler parameters are consistent with abnormal left ventricular relaxation (grade 1 diastolic dysfunction). Doppler parameters are consistent with high ventricular filling pressure. - Aortic valve: Noncoronary cusp mobility was restricted. There was severe stenosis. There was mild to moderate regurgitation. Regurgitation pressure half-time: 463 ms. - Mitral valve: Severely calcified annulus. Transvalvular velocity was within the normal range. There was no evidence for stenosis. There was no regurgitation. - Left atrium: The atrium was moderately dilated. - Right ventricle: The cavity size was normal. Wall thickness was normal. Systolic function was normal. - Atrial septum: No defect or patent foramen ovale was identified by color flow Doppler. - Tricuspid valve: There was mild regurgitation. - Pulmonary arteries: Systolic pressure was within the normal range. PA peak pressure: 31 mm Hg (S).  Impressions:  - Compared with the echo 11/2015, aortic stenosis is now  severe.  ------------------------------------------------------------------- Study data: Comparison was made to the study of 11/02/2015. Study status: Routine. Procedure: The patient reported no pain pre or post test. Transthoracic echocardiography for left ventricular function evaluation and for assessment of valvular function. Image quality was adequate. Study completion: There were no complications. Transthoracic echocardiography. M-mode, complete 2D, spectral Doppler, and color Doppler. Birthdate: Patient birthdate: December 24, 1945. Age: Patient is 71 yr old. Sex: Gender: female. BMI: 43.9 kg/m^2. Blood pressure: 130/70 Patient status: Outpatient. Study date: Study date: 01/08/2017. Study time: 04:13 PM. Location: [facility]  -------------------------------------------------------------------  ------------------------------------------------------------------- Left ventricle: The cavity size was normal. There was severe focal basal and moderate concentric hypertrophy of the septum with otherwise mild concentric hypertrophy. Systolic function was normal. The estimated ejection fraction was in the range of 60% to 65%. There was dynamic obstruction at restin the mid cavity,  with a peak velocity of 263 cm/sec and a peak gradient of 28 mm Hg. Wall motion was normal; there were no regional wall motion abnormalities. Doppler parameters are consistent with abnormal left ventricular relaxation (grade 1 diastolic dysfunction). Doppler parameters are consistent with high ventricular filling pressure.  ------------------------------------------------------------------- Aortic valve: Trileaflet; severely thickened, severely calcified leaflets. Noncoronary cusp mobility was restricted. Doppler: There was severe stenosis. There was mild to moderate regurgitation. VTI ratio of LVOT to aortic valve: 0.36. Valve area (VTI): 1.02 cm^2. Indexed  valve area (VTI): 0.47 cm^2/m^2. Peak velocity ratio of LVOT to aortic valve: 0.36. Valve area (Vmax): 1.02 cm^2. Indexed valve area (Vmax): 0.47 cm^2/m^2. Mean velocity ratio of LVOT to aortic valve: 0.38. Valve area (Vmean): 1.09 cm^2. Indexed valve area (Vmean): 0.5 cm^2/m^2. Mean gradient (S): 43 mm Hg. Peak gradient (S): 81 mm Hg.  ------------------------------------------------------------------- Aorta: Aortic root: The aortic root was normal in size.  ------------------------------------------------------------------- Mitral valve: Severely calcified annulus. Mobility was not restricted. Doppler: Transvalvular velocity was within the normal range. There was no evidence for stenosis. There was no regurgitation. Peak gradient (D): 6 mm Hg.  ------------------------------------------------------------------- Left atrium: The atrium was moderately dilated.  ------------------------------------------------------------------- Atrial septum: No defect or patent foramen ovale was identified by color flow Doppler.  ------------------------------------------------------------------- Right ventricle: The cavity size was normal. Wall thickness was normal. Systolic function was normal.  ------------------------------------------------------------------- Pulmonic valve: Structurally normal valve. Cusp separation was normal. Doppler: Transvalvular velocity was within the normal range. There was no evidence for stenosis. There was no regurgitation.  ------------------------------------------------------------------- Tricuspid valve: Structurally normal valve. Doppler: Transvalvular velocity was within the normal range. There was mild regurgitation.  ------------------------------------------------------------------- Pulmonary artery: The main pulmonary artery was normal-sized. Systolic pressure was within the normal  range.  ------------------------------------------------------------------- Right atrium: The atrium was normal in size.  ------------------------------------------------------------------- Pericardium: There was no pericardial effusion.  ------------------------------------------------------------------- Systemic veins: Inferior vena cava: The vessel was normal in size. The respirophasic diameter changes were in the normal range (>= 50%), consistent with normal central venous pressure.  ------------------------------------------------------------------- Measurements  Left ventricle Value Reference LV ID, ED, PLAX chordal (L) 32.7 mm 43 - 52 LV ID, ES, PLAX chordal (L) 21.1 mm 23 - 38 LV fx shortening, PLAX chordal 35 % >=29 LV PW thickness, ED 12.3 mm --------- IVS/LV PW ratio, ED (H) 1.7 <=1.3 Stroke volume, 2D 94 ml --------- Stroke volume/bsa, 2D 43 ml/m^2 --------- LV e&', lateral 4.48 cm/s --------- LV E/e&', lateral 26.34 --------- LV e&', medial 3.9 cm/s --------- LV E/e&', medial 30.26 --------- LV e&', average 4.19 cm/s --------- LV E/e&', average 28.16 ---------  Ventricular septum Value Reference IVS thickness, ED 20.96 mm ---------  LVOT Value Reference LVOT ID, S 19 mm --------- LVOT area 2.84 cm^2 --------- LVOT ID  19 mm --------- LVOT peak velocity, S 162 cm/s --------- LVOT mean velocity, S 114 cm/s --------- LVOT VTI, S 33.1 cm --------- LVOT peak gradient, S 10 mm Hg --------- Stroke volume (SV), LVOT DP 93.8 ml --------- Stroke index (SV/bsa), LVOT DP 42.8 ml/m^2 ---------  Aortic valve Value Reference Aortic valve peak velocity, S 450 cm/s --------- Aortic valve mean velocity, S 298 cm/s --------- Aortic valve VTI, S 92.1 cm --------- Aortic mean gradient, S 43 mm Hg --------- Aortic peak gradient, S 81 mm Hg --------- VTI ratio, LVOT/AV 0.36 --------- Aortic valve area, VTI 1.02 cm^2 --------- Aortic valve area/bsa, VTI 0.47 cm^2/m^2 --------- Velocity ratio, peak, LVOT/AV 0.36 --------- Aortic valve area, peak velocity 1.02 cm^2 --------- Aortic  valve area/bsa, peak 0.47 cm^2/m^2 --------- velocity Velocity ratio, mean, LVOT/AV 0.38 --------- Aortic valve area, mean velocity 1.09 cm^2 --------- Aortic valve area/bsa, mean 0.5 cm^2/m^2 --------- velocity Aortic regurg pressure half-time 463 ms ---------  Aorta Value Reference Aortic root ID, ED 30 mm --------- Ascending aorta ID, A-P, S 34 mm ---------  Left atrium Value Reference LA ID, A-P, ES 36 mm  --------- LA ID/bsa, A-P 1.64 cm/m^2 <=2.2 LA volume, S 71 ml --------- LA volume/bsa, S 32.4 ml/m^2 --------- LA volume, ES, 1-p A4C 78 ml --------- LA volume/bsa, ES, 1-p A4C 35.6 ml/m^2 --------- LA volume, ES, 1-p A2C 64 ml --------- LA volume/bsa, ES, 1-p A2C 29.2 ml/m^2 ---------  Mitral valve Value Reference Mitral E-wave peak velocity 118 cm/s --------- Mitral A-wave peak velocity 172 cm/s --------- Mitral deceleration time (H) 553 ms 150 - 230 Mitral peak gradient, D 6 mm Hg --------- Mitral E/A ratio, peak 0.7 ---------  Pulmonary arteries Value Reference PA pressure, S, DP (H) 31 mm Hg <=30  Tricuspid valve Value Reference Tricuspid regurg peak velocity 265 cm/s --------- Tricuspid peak RV-RA gradient 28 mm Hg ---------  Systemic veins Value Reference Estimated CVP 3 mm Hg ---------  Right ventricle Value Reference RV pressure, S, DP (H) 31 mm Hg <=30 RV s&', lateral, S 15.8 cm/s ---------  Legend: (L) and (H) mark values outside specified reference range.  ------------------------------------------------------------------- Prepared and Electronically Authenticated by  Skeet Latch, MD 2018-05-08T17:02:56   Physicians   Panel Physicians Referring Physician Case Authorizing Physician  [name],  MD (Primary)    Procedures   Left Heart Cath and Coronary Angiography  Conclusion   1. Known severe aortic stenosis with peak and mean transvalvular gradients by cardiac catheterization of 48 and 32 mmHg, respectively  2. Severe single-vessel coronary artery disease involving the left circumflex  3. Mild nonobstructive disease involving the RCA and LAD  4. Severely calcified aortic valve with restricted aortic valve leaflet mobility and severe mitral annular calcification  Recommendations: Patient to go for cardiac surgical evaluation for consideration of aortic valve replacement plus/minus grafting of the first OM  Indications   Severe aortic stenosis [I35.0 (ICD-10-CM)]  Procedural Details/Technique   Technical Details INDICATION: Severe aortic stenosis (pre-op study)  PROCEDURAL DETAILS: The right wrist was prepped, draped, and anesthetized with 1% lidocaine. Using the modified Seldinger technique, a 5/6 French Slender sheath was introduced into the right radial artery. 3 mg of verapamil was administered through the sheath, weight-based unfractionated heparin was administered intravenously. Standard Judkins catheters were used for selective coronary angiography. Left ventricular pressure is recorded with a JR4 catheter. A pullback gradient is measured across the aortic valve. Catheter exchanges were performed over an exchange length guidewire. There were no immediate procedural complications. A TR band was used for radial hemostasis at the completion of the procedure. The patient was transferred to the post catheterization recovery area for further monitoring.    Estimated blood loss <50 mL.  During this procedure the patient was administered the following to achieve and maintain moderate conscious sedation: Versed 2 mg, Fentanyl 25 mcg, while the patient's heart rate, blood pressure, and oxygen saturation were continuously monitored. The period of conscious sedation was 31  minutes, of which I was present face-to-face 100% of this time.    Coronary Findings   Dominance: Right  Left Main  The vessel exhibits minimal luminal irregularities.  Left Anterior Descending  Prox LAD to Mid LAD lesion, 40% stenosed. The lesion is calcified.  Left  Circumflex  Prox Cx lesion, 80% stenosed. The lesion is eccentric. The lesion is moderately calcified. There is a tight eccentric lesion in the proximal circumflex just before the bifurcation of the AV circumflex and the first obtuse marginal branch. The lesion is difficult to appreciate in most views but is well demonstrated on the caudal and RAO caudal views.  Mid Cx lesion, 50% stenosed. The lesion is eccentric.  Right Coronary Artery  The RCA is a dominant vessel. It is moderately calcified throughout. There is no high-grade obstruction.  Prox RCA lesion, 30% stenosed.  Mid RCA lesion, 30% stenosed. The lesion is eccentric.  First Right Posterolateral  Vessel is small in size.  1st RPLB lesion, 40% stenosed.  Left Heart   Mitral Valve The annulus is calcified.    Aortic Valve The aortic valve is calcified. There is restricted aortic valve motion.    Coronary Diagrams   Diagnostic Diagram       Implants        No implant documentation for this case.  PACS Images   Show images for Cardiac catheterization   Link to Procedure Log   Procedure Log    Hemo Data    Most Recent Value  Aortic Mean Gradient 32.3 mmHg  Aortic Peak Gradient 48 mmHg  AO Systolic Pressure 330 mmHg  AO Diastolic Pressure 67 mmHg  AO Mean 94 mmHg  LV Systolic Pressure 076 mmHg  LV Diastolic Pressure 1 mmHg  LV EDP 11 mmHg  Arterial Occlusion Pressure Extended Systolic Pressure 226 mmHg  Arterial Occlusion Pressure Extended Diastolic Pressure 59 mmHg  Arterial Occlusion Pressure Extended Mean Pressure 84 mmHg  Left Ventricular Apex Extended Systolic Pressure 333 mmHg  Left Ventricular Apex Extended Diastolic Pressure  0 mmHg  Left Ventricular Apex Extended EDP Pressure 9 mmHg   CT CORONARY MORPH W/CTA COR W/SCORE W/CA W/CM &/OR WO/CM (Accession [id number]) (Order [id number])  Imaging  Date: 02/14/2017 Department: [facility] CT IMAGING Released By: Danielle Dess Authorizing: [name], MD  Exam Information   Status Exam Begun  Exam Ended   Final [99] 02/14/2017 3:20 PM 02/14/2017 3:53 PM  PACS Images   Show images for CT CORONARY MORPH W/CTA COR W/SCORE W/CA W/CM &/OR WO/CM  Addendum   ADDENDUM REPORT: 02/14/2017 19:42  CLINICAL DATA:  Aortic stenosis  EXAM: Cardiac TAVR CT  TECHNIQUE: The patient was scanned on a Siemens 128 scanner. A 120 kV retrospective scan was triggered in the ascending thoracic aorta at 120 HU's. Gantry rotation speed was 300 msecs and collimation was .52mm. 5mg  of lopressor given. The 3D data set was reconstructed in 5% intervals of the R-R cycle. Systolic and diastolic phases were analyzed on a dedicated work station using MPR, MIP and VRT modes. The patient received 80 cc of contrast.  FINDINGS: Aortic Valve: Tri leaflet and severely calcified with restricted motion Mild calcification of the STJ and aortic atherosclerosis  Aorta: Mild calcific atherosclerosis with normal origin of the great vessels  Sinotubular Junction:  26 mm  Ascending Thoracic Aorta:  33 mm  Aortic Arch:  24 mm  Descending Thoracic Aorta:  22 mm  Sinus of Valsalva Measurements:  Non-coronary:  27 mm  Right -coronary:  27 mm  Left -coronary:  26 mm  Coronary Artery Height above Annulus:  Left Main:  Somewhat shallow at 9.8 mm  Right Coronary:  12.5 mm  Virtual Basal Annulus Measurements:  Maximum/Minimum Diameter:  20 mm x 25.5 mm  Perimeter:  72 mm  Area:  376 mm2  Coronary Arteries:  LM somewhat shallow in regard to deployment  Optimum Fluoroscopic Angle for Delivery:  LAO 11 CAU 12 degrees  IMPRESSION: 1)  Severely calcified tri leaflet aortic valve with annulus suitable for a 23 mm Sapien 3 valve  2) Large nodular area of annular calcification at the base of the left cusp that may increase the risk of peri valvular regurgitation  3) LM some what shallow only 9.8 mm above annulus  4) No LAA thrombus  5) Optimum angle for deployment LAO 11 CAU 12 degrees  Jenkins Rouge   Electronically Signed   By: Jenkins Rouge M.D.   On: 02/14/2017 19:42      CT Angio Abd/Pel w/ and/or w/o (Accession [id number]) (Order [id number])  Imaging  Date: 03/19/2017 Department: [facility] CT IMAGING Released By: Reggy Eye Authorizing: [name], MD  Exam Information   Status Exam Begun  Exam Ended   Final [99] 03/19/2017 2:01 PM 03/19/2017 2:13 PM  PACS Images   Show images for CT Angio Abd/Pel w/ and/or w/o  Study Result   CLINICAL DATA:  71 year old female with history of severe aortic stenosis. Preprocedural study prior to potential transcatheter aortic valve replacement (TAVR).  EXAM: CT ANGIOGRAPHY CHEST, ABDOMEN AND PELVIS  TECHNIQUE: Multidetector CT imaging through the chest, abdomen and pelvis was performed using the standard protocol during bolus administration of intravenous contrast. Multiplanar reconstructed images and MIPs were obtained and reviewed to evaluate the vascular anatomy.  CONTRAST:  100 mL of Isovue 370.  COMPARISON:  No priors.  FINDINGS: CTA CHEST FINDINGS  Cardiovascular: Heart size is normal. There is no significant pericardial fluid, thickening or pericardial calcification. There is aortic atherosclerosis, as well as atherosclerosis of the great vessels of the mediastinum and the coronary arteries, including calcified atherosclerotic plaque in the left anterior descending, left circumflex and right coronary arteries. Severe thickening calcification of the aortic valve, mitral annulus and posterior leaflet of  the mitral valve.  Mediastinum/Lymph Nodes: No pathologically enlarged mediastinal or hilar lymph nodes. Hiatal hernia containing some retroperitoneal fat as well as a trace volume of fluid adjacent to the distal third of the esophagus. Small sliding-type hiatal hernia of the esophagus also noted. No axillary lymphadenopathy.  Lungs/Pleura: No acute consolidative airspace disease. No pleural effusions. There multiple tiny pulmonary nodules scattered throughout both lungs measuring 4 mm or less in size which are nonspecific. No larger more suspicious appearing pulmonary nodules or masses are noted.  Musculoskeletal/Soft Tissues: Old healed fractures of the anterior aspects of the left third, fourth and fifth ribs are incidentally noted. There are no aggressive appearing lytic or blastic lesions noted in the visualized portions of the skeleton.  CTA ABDOMEN AND PELVIS FINDINGS  Hepatobiliary: No cystic or solid hepatic lesions. No intra or extrahepatic biliary ductal dilatation. Gallbladder is normal in appearance.  Pancreas: No pancreatic mass. No pancreatic ductal dilatation. No pancreatic or peripancreatic fluid or inflammatory changes.  Spleen: Unremarkable.  Adrenals/Urinary Tract: Bilateral kidneys and bilateral adrenal glands are normal in appearance. There is no hydroureteronephrosis. Urinary bladder is normal in appearance.  Stomach/Bowel: The appearance of the stomach is normal. There is no pathologic dilatation of small bowel or colon. Several colonic diverticulae are noted, without surrounding inflammatory changes to suggest an acute diverticulitis at this time. Although there is a small appendicolith in the neck of the appendix, the appendix (retrocecal) is otherwise normal in size and appearance  without surrounding inflammatory changes.  Vascular/Lymphatic: Aortic atherosclerosis with vascular findings and measurements pertinent to potential TAVR  procedure, as detailed below. No aneurysm or dissection identified in the abdominal or pelvic vasculature. Celiac axis, superior mesenteric artery and inferior mesenteric artery are all widely patent without hemodynamically significant stenosis. Single renal arteries are widely patent bilaterally. No lymphadenopathy identified in the abdomen or pelvis.  Reproductive: Uterus and ovaries are atrophic.  Other: No significant volume of ascites. No pneumoperitoneum. Small umbilical hernia containing only omental fat.  Musculoskeletal: There are no aggressive appearing lytic or blastic lesions noted in the visualized portions of the skeleton.  VASCULAR MEASUREMENTS PERTINENT TO TAVR:  AORTA:  Minimal Aortic Diameter -  13 x 13 mm  Severity of Aortic Calcification -  mild  RIGHT PELVIS:  Right Common Iliac Artery -  Minimal Diameter - 9.5 x 8.3 mm  Tortuosity - mild  Calcification - mild  Right External Iliac Artery -  Minimal Diameter - 7.2 x 6.8 mm  Tortuosity - severe  Calcification - none  Right Common Femoral Artery -  Minimal Diameter - 8.6 x 7.4 mm  Tortuosity - mild  Calcification - none  LEFT PELVIS:  Left Common Iliac Artery -  Minimal Diameter - 8.4 x 7.0 mm  Tortuosity - mild  Calcification - mild  Left External Iliac Artery -  Minimal Diameter - 7.1 x 7.3 mm  Tortuosity - severe  Calcification - none  Left Common Femoral Artery -  Minimal Diameter - 7.2 x 7.5 mm  Tortuosity - mild  Calcification - none  Review of the MIP images confirms the above findings.  IMPRESSION: 1. Vascular findings and measurements pertinent to potential TAVR procedure, as detailed above. This patient appears to have suitable pelvic arterial access bilaterally. 2. Severe thickening calcification of the aortic valve, compatible with the reported clinical history of severe aortic stenosis. 3. There is also extensive  calcification of the mitral annulus and posterior leaflet of the mitral valve. 4. Aortic atherosclerosis, in addition to 3 vessel coronary artery disease. Assessment for potential risk factor modification, dietary therapy or pharmacologic therapy may be warranted, if clinically indicated. 5. Colonic diverticulosis without evidence of acute diverticulitis at this time. 6. Additional incidental findings, as above.  Aortic Atherosclerosis (ICD10-I70.0).   Electronically Signed   By: Vinnie Langton M.D.   On: 03/19/2017 15:12     RISK SCORES About the STS Risk Calculator Procedure: AV Replacement + CAB  Risk of Mortality: 1.61%  Morbidity or Mortality: 13.654%  Long Length of Stay: 6.496%  Short Length of Stay: 34.778%  Permanent Stroke: 1.799%  Prolonged Ventilation: 9.271%  DSW Infection: 0.373%  Renal Failure: 3.239%  Reoperation: 6.147%   Impression:  This 71 year old woman has stage C severe, asymptomatic aortic stenosis. She also has an 80% LCX stenosis but no anginal symptoms. Her STS PROM for AVR and CABG is only 1.6% but I think she would have a slow, difficult recovery due to her obesity and DJD. She says she had a tough time recovering after her knee replacements. Her annulus is also relatively small for her BSA of 2.2 and I suspect that she would need a root replacement to get an adequately sized valve to prevent patient-prosthesis mismatch. TAVR with a 23 mm Sapien 3 valve would give her a slightly larger valve but still small for her BSA. She would certainly have a smoother and quicker recovery after TAVR. I think a Medtronic Evolut valve is probably  the best option for her. We discussed complications that might develop including but not limited to risks of death, stroke, paravalvular leak, aortic dissection or other major vascular complications, aortic annulus rupture, device embolization, cardiac rupture or perforation, mitral regurgitation, acute myocardial  infarction, arrhythmia, heart block or bradycardia requiring permanent pacemaker placement, congestive heart failure, respiratory failure, renal failure, pneumonia, infection, other late complications related to structural valve deterioration or migration, or other complications that might ultimately cause a temporary or permanent loss of functional independence or other long term morbidity. The patient provides full informed consent for the procedure as described and all questions were answered.      Plan:  Transfemoral TAVR using a Medtronic Evolut valve on 04/23/2017.  [name], MD [facility] [phone]

## 2017-04-23 ENCOUNTER — Inpatient Hospital Stay (HOSPITAL_COMMUNITY): Payer: Medicare Other

## 2017-04-23 ENCOUNTER — Encounter (HOSPITAL_COMMUNITY): Payer: Self-pay | Admitting: Surgery

## 2017-04-23 ENCOUNTER — Encounter (HOSPITAL_COMMUNITY)
Admission: RE | Disposition: A | Discharge status: Home / Self Care | Payer: Self-pay | Source: Home / Self Care | Attending: Cardiovascular Disease

## 2017-04-23 ENCOUNTER — Inpatient Hospital Stay (HOSPITAL_COMMUNITY): Payer: Medicare Other | Admitting: Emergency Medicine

## 2017-04-23 ENCOUNTER — Inpatient Hospital Stay (HOSPITAL_COMMUNITY): Payer: Medicare Other | Admitting: Vascular Surgery

## 2017-04-23 ENCOUNTER — Inpatient Hospital Stay (HOSPITAL_COMMUNITY)
Admission: RE | Admit: 2017-04-23 | Discharge: 2017-04-25 | DRG: 267 | Disposition: A | Discharge status: Home / Self Care | Payer: Medicare Other | Attending: Cardiovascular Disease | Admitting: Cardiovascular Disease

## 2017-04-23 ENCOUNTER — Other Ambulatory Visit: Payer: Self-pay

## 2017-04-23 DIAGNOSIS — I739 Peripheral vascular disease, unspecified: Secondary | ICD-10-CM

## 2017-04-23 DIAGNOSIS — D62 Acute posthemorrhagic anemia: Secondary | ICD-10-CM | POA: Diagnosis not present

## 2017-04-23 DIAGNOSIS — E669 Obesity, unspecified: Secondary | ICD-10-CM | POA: Diagnosis present

## 2017-04-23 DIAGNOSIS — E785 Hyperlipidemia, unspecified: Secondary | ICD-10-CM | POA: Diagnosis present

## 2017-04-23 DIAGNOSIS — J9811 Atelectasis: Secondary | ICD-10-CM | POA: Diagnosis not present

## 2017-04-23 DIAGNOSIS — K5730 Diverticulosis of large intestine without perforation or abscess without bleeding: Secondary | ICD-10-CM | POA: Diagnosis not present

## 2017-04-23 DIAGNOSIS — I779 Disorder of arteries and arterioles, unspecified: Secondary | ICD-10-CM | POA: Diagnosis present

## 2017-04-23 DIAGNOSIS — I083 Combined rheumatic disorders of mitral, aortic and tricuspid valves: Secondary | ICD-10-CM | POA: Diagnosis not present

## 2017-04-23 DIAGNOSIS — Z006 Encounter for examination for normal comparison and control in clinical research program: Secondary | ICD-10-CM

## 2017-04-23 DIAGNOSIS — K219 Gastro-esophageal reflux disease without esophagitis: Secondary | ICD-10-CM | POA: Diagnosis not present

## 2017-04-23 DIAGNOSIS — I10 Essential (primary) hypertension: Secondary | ICD-10-CM | POA: Diagnosis present

## 2017-04-23 DIAGNOSIS — Z87891 Personal history of nicotine dependence: Secondary | ICD-10-CM | POA: Diagnosis not present

## 2017-04-23 DIAGNOSIS — I2510 Atherosclerotic heart disease of native coronary artery without angina pectoris: Secondary | ICD-10-CM | POA: Diagnosis not present

## 2017-04-23 DIAGNOSIS — K449 Diaphragmatic hernia without obstruction or gangrene: Secondary | ICD-10-CM | POA: Diagnosis not present

## 2017-04-23 DIAGNOSIS — I350 Nonrheumatic aortic (valve) stenosis: Principal | ICD-10-CM | POA: Diagnosis present

## 2017-04-23 DIAGNOSIS — G4733 Obstructive sleep apnea (adult) (pediatric): Secondary | ICD-10-CM | POA: Diagnosis not present

## 2017-04-23 DIAGNOSIS — I700 Atherosclerosis of aorta: Secondary | ICD-10-CM | POA: Diagnosis not present

## 2017-04-23 DIAGNOSIS — I361 Nonrheumatic tricuspid (valve) insufficiency: Secondary | ICD-10-CM | POA: Diagnosis not present

## 2017-04-23 DIAGNOSIS — Z951 Presence of aortocoronary bypass graft: Secondary | ICD-10-CM | POA: Diagnosis not present

## 2017-04-23 DIAGNOSIS — Z6841 Body Mass Index (BMI) 40.0 and over, adult: Secondary | ICD-10-CM

## 2017-04-23 DIAGNOSIS — K429 Umbilical hernia without obstruction or gangrene: Secondary | ICD-10-CM | POA: Diagnosis not present

## 2017-04-23 DIAGNOSIS — I371 Nonrheumatic pulmonary valve insufficiency: Secondary | ICD-10-CM | POA: Diagnosis not present

## 2017-04-23 DIAGNOSIS — Z952 Presence of prosthetic heart valve: Secondary | ICD-10-CM

## 2017-04-23 HISTORY — PX: TRANSCATHETER AORTIC VALVE REPLACEMENT, TRANSFEMORAL: SHX6400

## 2017-04-23 HISTORY — DX: Nonrheumatic aortic (valve) stenosis: I35.0

## 2017-04-23 HISTORY — PX: TEE WITHOUT CARDIOVERSION: SHX5443

## 2017-04-23 HISTORY — DX: Atherosclerotic heart disease of native coronary artery without angina pectoris: I25.10

## 2017-04-23 LAB — POCT I-STAT, CHEM 8
BUN: 12 mg/dL (ref 6–20)
BUN: 11 mg/dL (ref 6–20)
BUN: 10 mg/dL (ref 6–20)
CHLORIDE: 105 mmol/L (ref 101–111)
CHLORIDE: 106 mmol/L (ref 101–111)
Calcium, Ion: 1.14 mmol/L — ABNORMAL LOW (ref 1.15–1.40)
Calcium, Ion: 1.12 mmol/L — ABNORMAL LOW (ref 1.15–1.40)
Calcium, Ion: 1.13 mmol/L — ABNORMAL LOW (ref 1.15–1.40)
Chloride: 104 mmol/L (ref 101–111)
Creatinine, Ser: 0.4 mg/dL — ABNORMAL LOW (ref 0.44–1.00)
Creatinine, Ser: 0.4 mg/dL — ABNORMAL LOW (ref 0.44–1.00)
Creatinine, Ser: 0.4 mg/dL — ABNORMAL LOW (ref 0.44–1.00)
Glucose, Bld: 155 mg/dL — ABNORMAL HIGH (ref 65–99)
Glucose, Bld: 180 mg/dL — ABNORMAL HIGH (ref 65–99)
Glucose, Bld: 177 mg/dL — ABNORMAL HIGH (ref 65–99)
HEMATOCRIT: 35 % — AB (ref 36.0–46.0)
HEMATOCRIT: 35 % — AB (ref 36.0–46.0)
HEMATOCRIT: 35 % — AB (ref 36.0–46.0)
HEMOGLOBIN: 11.9 g/dL — AB (ref 12.0–15.0)
Hemoglobin: 11.9 g/dL — ABNORMAL LOW (ref 12.0–15.0)
Hemoglobin: 11.9 g/dL — ABNORMAL LOW (ref 12.0–15.0)
POTASSIUM: 3.9 mmol/L (ref 3.5–5.1)
POTASSIUM: 3.6 mmol/L (ref 3.5–5.1)
POTASSIUM: 3.4 mmol/L — AB (ref 3.5–5.1)
SODIUM: 140 mmol/L (ref 135–145)
SODIUM: 141 mmol/L (ref 135–145)
Sodium: 141 mmol/L (ref 135–145)
TCO2: 26 mmol/L (ref 0–100)
TCO2: 27 mmol/L (ref 0–100)
TCO2: 26 mmol/L (ref 0–100)

## 2017-04-23 LAB — POCT I-STAT 3, ART BLOOD GAS (G3+)
Acid-base deficit: 1 mmol/L (ref 0.0–2.0)
Bicarbonate: 24.6 mmol/L (ref 20.0–28.0)
O2 SAT: 99 %
PCO2 ART: 42.3 mmHg (ref 32.0–48.0)
PO2 ART: 119 mmHg — AB (ref 83.0–108.0)
Patient temperature: 97.7
TCO2: 26 mmol/L (ref 0–100)
pH, Arterial: 7.371 (ref 7.350–7.450)

## 2017-04-23 LAB — PROTIME-INR
INR: 1.14
Prothrombin Time: 14.7 seconds (ref 11.4–15.2)

## 2017-04-23 LAB — POCT I-STAT 4, (NA,K, GLUC, HGB,HCT)
GLUCOSE: 160 mg/dL — AB (ref 65–99)
HEMATOCRIT: 33 % — AB (ref 36.0–46.0)
HEMOGLOBIN: 11.2 g/dL — AB (ref 12.0–15.0)
Potassium: 3.7 mmol/L (ref 3.5–5.1)
Sodium: 142 mmol/L (ref 135–145)

## 2017-04-23 LAB — CBC
HCT: 35.9 % — ABNORMAL LOW (ref 36.0–46.0)
Hemoglobin: 11.5 g/dL — ABNORMAL LOW (ref 12.0–15.0)
MCH: 27.6 pg (ref 26.0–34.0)
MCHC: 32 g/dL (ref 30.0–36.0)
MCV: 86.3 fL (ref 78.0–100.0)
PLATELETS: 218 10*3/uL (ref 150–400)
RBC: 4.16 MIL/uL (ref 3.87–5.11)
RDW: 14.5 % (ref 11.5–15.5)
WBC: 8.4 10*3/uL (ref 4.0–10.5)

## 2017-04-23 LAB — APTT: aPTT: 30 seconds (ref 24–36)

## 2017-04-23 SURGERY — IMPLANTATION, AORTIC VALVE, TRANSCATHETER, FEMORAL APPROACH
Anesthesia: General | Site: Chest

## 2017-04-23 MED ORDER — MIDAZOLAM HCL 2 MG/2ML IJ SOLN: 2.0000 mg | INTRAMUSCULAR | Status: DC | PRN

## 2017-04-23 MED ORDER — MORPHINE SULFATE (PF) 4 MG/ML IV SOLN: 2.0000 mg | INTRAVENOUS | Status: DC | PRN

## 2017-04-23 MED ORDER — SODIUM CHLORIDE 0.9 % IV SOLN: 250.0000 mL | INTRAVENOUS | Status: DC | PRN

## 2017-04-23 MED ORDER — IODIXANOL 320 MG/ML IV SOLN
INTRAVENOUS | Status: DC | PRN
  Administered 2017-04-23: 50 mL via INTRA_ARTERIAL

## 2017-04-23 MED ORDER — NITROGLYCERIN IN D5W 200-5 MCG/ML-% IV SOLN: 0.0000 ug/min | INTRAVENOUS | Status: DC

## 2017-04-23 MED ORDER — SODIUM CHLORIDE 0.9 % IV SOLN
INTRAVENOUS | Status: DC
  Administered 2017-04-23: 11:00:00 via INTRAVENOUS

## 2017-04-23 MED ORDER — FENTANYL CITRATE (PF) 100 MCG/2ML IJ SOLN
INTRAMUSCULAR | Status: DC | PRN
  Administered 2017-04-23 (×2): 50 ug via INTRAVENOUS

## 2017-04-23 MED ORDER — LACTATED RINGERS IV SOLN
INTRAVENOUS | Status: DC | PRN
  Administered 2017-04-23: 13:00:00 via INTRAVENOUS

## 2017-04-23 MED ORDER — CLOPIDOGREL BISULFATE 75 MG PO TABS
75.0000 mg | ORAL_TABLET | Freq: Every day | ORAL | Status: DC
  Administered 2017-04-24 – 2017-04-25 (×2): 75 mg via ORAL
  Filled 2017-04-23 (×2): qty 1

## 2017-04-23 MED ORDER — LEVOFLOXACIN IN D5W 750 MG/150ML IV SOLN
750.0000 mg | INTRAVENOUS | Status: AC
  Administered 2017-04-24: 750 mg via INTRAVENOUS
  Filled 2017-04-23: qty 150

## 2017-04-23 MED ORDER — SODIUM CHLORIDE 0.9 % IV SOLN
1.0000 mL/kg/h | INTRAVENOUS | Status: AC
  Administered 2017-04-23: 1 mL/kg/h via INTRAVENOUS

## 2017-04-23 MED ORDER — ALBUMIN HUMAN 5 % IV SOLN
INTRAVENOUS | Status: DC | PRN
  Administered 2017-04-23: 15:00:00 via INTRAVENOUS

## 2017-04-23 MED ORDER — SUGAMMADEX SODIUM 500 MG/5ML IV SOLN
INTRAVENOUS | Status: AC
Start: 2017-04-23 — End: 2017-04-23
  Filled 2017-04-23: qty 5

## 2017-04-23 MED ORDER — CHLORHEXIDINE GLUCONATE 0.12 % MT SOLN
15.0000 mL | Freq: Once | OROMUCOSAL | Status: AC
  Administered 2017-04-23 – 2017-04-24 (×2): 15 mL via OROMUCOSAL
  Filled 2017-04-23: qty 15

## 2017-04-23 MED ORDER — METOPROLOL TARTRATE 12.5 MG HALF TABLET
12.5000 mg | ORAL_TABLET | Freq: Two times a day (BID) | ORAL | Status: DC
  Administered 2017-04-23 – 2017-04-25 (×4): 12.5 mg via ORAL
  Filled 2017-04-23 (×4): qty 1

## 2017-04-23 MED ORDER — FENTANYL CITRATE (PF) 250 MCG/5ML IJ SOLN
INTRAMUSCULAR | Status: AC
  Filled 2017-04-23: qty 5

## 2017-04-23 MED ORDER — 0.9 % SODIUM CHLORIDE (POUR BTL) OPTIME
TOPICAL | Status: DC | PRN
  Administered 2017-04-23: 5000 mL

## 2017-04-23 MED ORDER — CHLORHEXIDINE GLUCONATE 0.12 % MT SOLN
15.0000 mL | OROMUCOSAL | Status: AC
  Administered 2017-04-23: 15 mL via OROMUCOSAL

## 2017-04-23 MED ORDER — LIDOCAINE 2% (20 MG/ML) 5 ML SYRINGE
INTRAMUSCULAR | Status: AC
  Filled 2017-04-23: qty 5

## 2017-04-23 MED ORDER — LACTATED RINGERS IV SOLN: 500.0000 mL | Freq: Once | INTRAVENOUS | Status: DC | PRN

## 2017-04-23 MED ORDER — ALBUMIN HUMAN 5 % IV SOLN: 250.0000 mL | INTRAVENOUS | Status: DC | PRN

## 2017-04-23 MED ORDER — SODIUM CHLORIDE 0.9% FLUSH
3.0000 mL | Freq: Two times a day (BID) | INTRAVENOUS | Status: DC
  Administered 2017-04-24 – 2017-04-25 (×4): 3 mL via INTRAVENOUS

## 2017-04-23 MED ORDER — ATORVASTATIN CALCIUM 40 MG PO TABS: 40.0000 mg | ORAL_TABLET | Freq: Every day | ORAL | Status: DC

## 2017-04-23 MED ORDER — SODIUM CHLORIDE 0.9 % IV SOLN
0.0000 ug/min | INTRAVENOUS | Status: DC
  Filled 2017-04-23: qty 2

## 2017-04-23 MED ORDER — LACTATED RINGERS IV SOLN
INTRAVENOUS | Status: DC | PRN
  Administered 2017-04-23: 14:00:00 via INTRAVENOUS

## 2017-04-23 MED ORDER — OXYCODONE HCL 5 MG PO TABS: 5.0000 mg | ORAL_TABLET | ORAL | Status: DC | PRN

## 2017-04-23 MED ORDER — PROTAMINE SULFATE 10 MG/ML IV SOLN
INTRAVENOUS | Status: DC | PRN
  Administered 2017-04-23: 30 mg via INTRAVENOUS
  Administered 2017-04-23: 20 mg via INTRAVENOUS
  Administered 2017-04-23: 30 mg via INTRAVENOUS
  Administered 2017-04-23: 10 mg via INTRAVENOUS

## 2017-04-23 MED ORDER — PROPOFOL 10 MG/ML IV BOLUS
INTRAVENOUS | Status: DC | PRN
  Administered 2017-04-23: 100 mg via INTRAVENOUS

## 2017-04-23 MED ORDER — PHENYLEPHRINE HCL 10 MG/ML IJ SOLN
INTRAMUSCULAR | Status: DC | PRN
  Administered 2017-04-23: 25 ug/min via INTRAVENOUS

## 2017-04-23 MED ORDER — FAMOTIDINE IN NACL 20-0.9 MG/50ML-% IV SOLN: 20.0000 mg | Freq: Two times a day (BID) | INTRAVENOUS | Status: DC

## 2017-04-23 MED ORDER — CHLORHEXIDINE GLUCONATE 4 % EX LIQD: 60.0000 mL | Freq: Once | CUTANEOUS | Status: DC

## 2017-04-23 MED ORDER — ASPIRIN EC 81 MG PO TBEC
81.0000 mg | DELAYED_RELEASE_TABLET | Freq: Every evening | ORAL | Status: DC
  Filled 2017-04-23: qty 1

## 2017-04-23 MED ORDER — ATORVASTATIN CALCIUM 40 MG PO TABS
40.0000 mg | ORAL_TABLET | Freq: Every day | ORAL | Status: DC
  Administered 2017-04-23 – 2017-04-24 (×2): 40 mg via ORAL
  Filled 2017-04-23 (×3): qty 1

## 2017-04-23 MED ORDER — VANCOMYCIN HCL IN DEXTROSE 1-5 GM/200ML-% IV SOLN
1000.0000 mg | Freq: Once | INTRAVENOUS | Status: AC
  Administered 2017-04-24: 1000 mg via INTRAVENOUS
  Filled 2017-04-23: qty 200

## 2017-04-23 MED ORDER — TRAMADOL HCL 50 MG PO TABS: 50.0000 mg | ORAL_TABLET | ORAL | Status: DC | PRN

## 2017-04-23 MED ORDER — IODIXANOL 320 MG/ML IV SOLN
INTRAVENOUS | Status: DC | PRN
  Administered 2017-04-23: 30.4 mL via INTRAVENOUS

## 2017-04-23 MED ORDER — ONDANSETRON HCL 4 MG/2ML IJ SOLN
INTRAMUSCULAR | Status: DC | PRN
  Administered 2017-04-23: 4 mg via INTRAVENOUS

## 2017-04-23 MED ORDER — SUGAMMADEX SODIUM 500 MG/5ML IV SOLN
INTRAVENOUS | Status: DC | PRN
  Administered 2017-04-23: 220 mg via INTRAVENOUS

## 2017-04-23 MED ORDER — PROPOFOL 10 MG/ML IV BOLUS
INTRAVENOUS | Status: AC
Start: 2017-04-23 — End: 2017-04-23
  Filled 2017-04-23: qty 20

## 2017-04-23 MED ORDER — SODIUM CHLORIDE 0.9% FLUSH: 3.0000 mL | INTRAVENOUS | Status: DC | PRN

## 2017-04-23 MED ORDER — SODIUM CHLORIDE 0.9 % IV SOLN
INTRAVENOUS | Status: DC | PRN
  Administered 2017-04-23: 500 mL

## 2017-04-23 MED ORDER — CHLORHEXIDINE GLUCONATE 4 % EX LIQD: 30.0000 mL | CUTANEOUS | Status: DC

## 2017-04-23 MED ORDER — MIDAZOLAM HCL 2 MG/2ML IJ SOLN
INTRAMUSCULAR | Status: AC
  Filled 2017-04-23: qty 2

## 2017-04-23 MED ORDER — ROCURONIUM BROMIDE 100 MG/10ML IV SOLN
INTRAVENOUS | Status: DC | PRN
  Administered 2017-04-23: 10 mg via INTRAVENOUS
  Administered 2017-04-23: 60 mg via INTRAVENOUS

## 2017-04-23 MED ORDER — ONDANSETRON HCL 4 MG/2ML IJ SOLN: 4.0000 mg | Freq: Four times a day (QID) | INTRAMUSCULAR | Status: DC | PRN

## 2017-04-23 MED ORDER — MUPIROCIN 2 % EX OINT
TOPICAL_OINTMENT | CUTANEOUS | Status: AC
  Administered 2017-04-23: 1 via TOPICAL
  Filled 2017-04-23: qty 22

## 2017-04-23 MED ORDER — ONDANSETRON HCL 4 MG/2ML IJ SOLN
INTRAMUSCULAR | Status: AC
  Filled 2017-04-23: qty 2

## 2017-04-23 MED ORDER — FENTANYL CITRATE (PF) 100 MCG/2ML IJ SOLN
INTRAMUSCULAR | Status: AC
  Filled 2017-04-23: qty 2

## 2017-04-23 MED ORDER — MUPIROCIN 2 % EX OINT
1.0000 "application " | TOPICAL_OINTMENT | Freq: Once | CUTANEOUS | Status: AC
  Administered 2017-04-23: 1 via TOPICAL

## 2017-04-23 MED ORDER — ROCURONIUM BROMIDE 10 MG/ML (PF) SYRINGE
PREFILLED_SYRINGE | INTRAVENOUS | Status: AC
  Filled 2017-04-23: qty 5

## 2017-04-23 MED ORDER — PANTOPRAZOLE SODIUM 40 MG PO TBEC
40.0000 mg | DELAYED_RELEASE_TABLET | Freq: Every day | ORAL | Status: DC
  Filled 2017-04-23: qty 1

## 2017-04-23 MED ORDER — LIDOCAINE HCL (CARDIAC) 20 MG/ML IV SOLN
INTRAVENOUS | Status: DC | PRN
  Administered 2017-04-23: 80 mg via INTRATRACHEAL

## 2017-04-23 MED ORDER — HEPARIN SODIUM (PORCINE) 1000 UNIT/ML IJ SOLN
INTRAMUSCULAR | Status: DC | PRN
  Administered 2017-04-23: 9000 [IU] via INTRAVENOUS

## 2017-04-23 MED ORDER — FENTANYL CITRATE (PF) 250 MCG/5ML IJ SOLN
INTRAMUSCULAR | Status: DC | PRN
  Administered 2017-04-23: 50 ug via INTRAVENOUS

## 2017-04-23 MED ORDER — METOPROLOL TARTRATE 5 MG/5ML IV SOLN: 2.5000 mg | INTRAVENOUS | Status: DC | PRN

## 2017-04-23 MED ORDER — METOPROLOL TARTRATE 25 MG/10 ML ORAL SUSPENSION: 12.5000 mg | Freq: Two times a day (BID) | ORAL | Status: DC

## 2017-04-23 MED ORDER — MORPHINE SULFATE (PF) 2 MG/ML IV SOLN: 2.0000 mg | INTRAVENOUS | Status: DC | PRN

## 2017-04-23 SURGICAL SUPPLY — 105 items
6F X 110CM EXPO PIGTAIL CATHETER ×3 IMPLANT
ADAPTER UNIV SWAN GANZ BIP (ADAPTER) ×2 IMPLANT
ADAPTER UNV SWAN GANZ BIP (ADAPTER) ×1
ATTRACTOMAT 16X20 MAGNETIC DRP (DRAPES) IMPLANT
BAG BANDED W/RUBBER/TAPE 36X54 (MISCELLANEOUS) ×3 IMPLANT
BAG DECANTER FOR FLEXI CONT (MISCELLANEOUS) IMPLANT
BAG SNAP BAND KOVER 36X36 (MISCELLANEOUS) ×6 IMPLANT
BLADE 10 SAFETY STRL DISP (BLADE) ×3 IMPLANT
BLADE CLIPPER SURG (BLADE) IMPLANT
BLADE STERNUM SYSTEM 6 (BLADE) ×3 IMPLANT
CABLE ADAPT CONN TEMP 6FT (ADAPTER) ×3 IMPLANT
CABLE PACING FASLOC BIEGE (MISCELLANEOUS) ×3 IMPLANT
CABLE PACING FASLOC BLUE (MISCELLANEOUS) ×3 IMPLANT
CANISTER SUCT 3000ML PPV (MISCELLANEOUS) IMPLANT
CANNULA FEM VENOUS REMOTE 22FR (CANNULA) IMPLANT
CANNULA OPTISITE PERFUSION 16F (CANNULA) IMPLANT
CANNULA OPTISITE PERFUSION 18F (CANNULA) IMPLANT
CATH DIAG EXPO 6F VENT PIG 145 (CATHETERS) ×6 IMPLANT
CATH EXPO 5FR AL1 (CATHETERS) ×3 IMPLANT
CATH S G BIP PACING (SET/KITS/TRAYS/PACK) ×6 IMPLANT
CLIP VESOCCLUDE MED 24/CT (CLIP) ×3 IMPLANT
CLIP VESOCCLUDE SM WIDE 24/CT (CLIP) ×3 IMPLANT
CONT SPEC 4OZ CLIKSEAL STRL BL (MISCELLANEOUS) ×6 IMPLANT
COVER BACK TABLE 60X90IN (DRAPES) ×3 IMPLANT
COVER BACK TABLE 80X110 HD (DRAPES) ×3 IMPLANT
COVER DOME SNAP 22 D (MISCELLANEOUS) ×3 IMPLANT
COVER MAYO STAND STRL (DRAPES) ×3 IMPLANT
CRADLE DONUT ADULT HEAD (MISCELLANEOUS) ×3 IMPLANT
DERMABOND ADVANCED (GAUZE/BANDAGES/DRESSINGS) ×1
DERMABOND ADVANCED .7 DNX12 (GAUZE/BANDAGES/DRESSINGS) ×2 IMPLANT
DEVICE CLOSURE PERCLS PRGLD 6F (VASCULAR PRODUCTS) ×4 IMPLANT
DRAPE INCISE IOBAN 66X45 STRL (DRAPES) IMPLANT
DRAPE SLUSH MACHINE 52X66 (DRAPES) ×3 IMPLANT
DRSG TEGADERM 4X4.75 (GAUZE/BANDAGES/DRESSINGS) ×3 IMPLANT
ELECT REM PT RETURN 9FT ADLT (ELECTROSURGICAL) ×6
ELECTRODE REM PT RTRN 9FT ADLT (ELECTROSURGICAL) ×4 IMPLANT
FELT TEFLON 6X6 (MISCELLANEOUS) ×3 IMPLANT
FEMORAL VENOUS CANN RAP (CANNULA) IMPLANT
GAUZE SPONGE 4X4 12PLY STRL (GAUZE/BANDAGES/DRESSINGS) ×3 IMPLANT
GAUZE SPONGE 4X4 12PLY STRL LF (GAUZE/BANDAGES/DRESSINGS) ×6 IMPLANT
GLOVE BIO SURGEON STRL SZ7.5 (GLOVE) ×3 IMPLANT
GLOVE BIO SURGEON STRL SZ8 (GLOVE) ×6 IMPLANT
GLOVE EUDERMIC 7 POWDERFREE (GLOVE) ×3 IMPLANT
GLOVE ORTHO TXT STRL SZ7.5 (GLOVE) ×3 IMPLANT
GOWN STRL REUS W/ TWL LRG LVL3 (GOWN DISPOSABLE) ×6 IMPLANT
GOWN STRL REUS W/ TWL XL LVL3 (GOWN DISPOSABLE) ×12 IMPLANT
GOWN STRL REUS W/TWL LRG LVL3 (GOWN DISPOSABLE) ×3
GOWN STRL REUS W/TWL XL LVL3 (GOWN DISPOSABLE) ×6
GUIDEWIRE SAF TJ AMPL .035X180 (WIRE) ×3 IMPLANT
GUIDEWIRE SAFE TJ AMPLATZ EXST (WIRE) ×3 IMPLANT
GUIDEWIRE STRAIGHT .035 260CM (WIRE) ×3 IMPLANT
INSERT FOGARTY 61MM (MISCELLANEOUS) ×3 IMPLANT
INSERT FOGARTY SM (MISCELLANEOUS) IMPLANT
INSERT FOGARTY XLG (MISCELLANEOUS) IMPLANT
KIT BASIN OR (CUSTOM PROCEDURE TRAY) ×3 IMPLANT
KIT DILATOR VASC 18G NDL (KITS) IMPLANT
KIT HEART LEFT (KITS) ×3 IMPLANT
KIT ROOM TURNOVER OR (KITS) ×3 IMPLANT
KIT SUCTION CATH 14FR (SUCTIONS) ×6 IMPLANT
NEEDLE PERC 18GX7CM (NEEDLE) ×3 IMPLANT
NS IRRIG 1000ML POUR BTL (IV SOLUTION) ×9 IMPLANT
PACK AORTA (CUSTOM PROCEDURE TRAY) ×3 IMPLANT
PAD ARMBOARD 7.5X6 YLW CONV (MISCELLANEOUS) ×6 IMPLANT
PAD ELECT DEFIB RADIOL ZOLL (MISCELLANEOUS) ×3 IMPLANT
PATCH TACHOSII LRG 9.5X4.8 (VASCULAR PRODUCTS) IMPLANT
PERCLOSE PROGLIDE 6F (VASCULAR PRODUCTS) ×6
SET MICROPUNCTURE 5F STIFF (MISCELLANEOUS) ×3 IMPLANT
SHEATH AVANTI 11CM 8FR (MISCELLANEOUS) ×3 IMPLANT
SHEATH PINNACLE 6F 10CM (SHEATH) ×6 IMPLANT
SLEEVE REPOSITIONING LENGTH 30 (MISCELLANEOUS) ×3 IMPLANT
SPONGE LAP 4X18 X RAY DECT (DISPOSABLE) ×3 IMPLANT
STOPCOCK MORSE 400PSI 3WAY (MISCELLANEOUS) ×18 IMPLANT
SUT ETHIBOND X763 2 0 SH 1 (SUTURE) IMPLANT
SUT GORETEX CV 4 TH 22 36 (SUTURE) IMPLANT
SUT GORETEX CV4 TH-18 (SUTURE) IMPLANT
SUT GORETEX TH-18 36 INCH (SUTURE) IMPLANT
SUT MNCRL AB 3-0 PS2 18 (SUTURE) IMPLANT
SUT PROLENE 3 0 SH1 36 (SUTURE) IMPLANT
SUT PROLENE 4 0 RB 1 (SUTURE)
SUT PROLENE 4-0 RB1 .5 CRCL 36 (SUTURE) IMPLANT
SUT PROLENE 5 0 C 1 36 (SUTURE) IMPLANT
SUT PROLENE 6 0 C 1 30 (SUTURE) IMPLANT
SUT SILK  1 MH (SUTURE) ×1
SUT SILK 1 MH (SUTURE) ×2 IMPLANT
SUT SILK 2 0 SH CR/8 (SUTURE) IMPLANT
SUT VIC AB 2-0 CT1 27 (SUTURE)
SUT VIC AB 2-0 CT1 TAPERPNT 27 (SUTURE) IMPLANT
SUT VIC AB 2-0 CTX 36 (SUTURE) IMPLANT
SUT VIC AB 3-0 SH 8-18 (SUTURE) IMPLANT
SYR 10ML LL (SYRINGE) ×9 IMPLANT
SYR 30ML LL (SYRINGE) ×6 IMPLANT
SYR 50ML LL SCALE MARK (SYRINGE) ×3 IMPLANT
SYS DELIVERY ENVEO PRO 16FR (MISCELLANEOUS) ×3
SYS LOAD ENVEOPRO 26/29MMX34MM (MISCELLANEOUS) ×3
SYSTEM DELIVERY ENVEO PRO 16FR (MISCELLANEOUS) ×2 IMPLANT
SYSTEM LOAD ENVPR 26/29MMX34MM (MISCELLANEOUS) ×2 IMPLANT
TAPE CLOTH SURG 4X10 WHT LF (GAUZE/BANDAGES/DRESSINGS) ×6 IMPLANT
TOWEL OR 17X26 10 PK STRL BLUE (TOWEL DISPOSABLE) ×6 IMPLANT
TRANSDUCER W/STOPCOCK (MISCELLANEOUS) ×6 IMPLANT
TRAY FOLEY SILVER 14FR TEMP (SET/KITS/TRAYS/PACK) ×3 IMPLANT
TUBE SUCT INTRACARD DLP 20F (MISCELLANEOUS) IMPLANT
TUBING HIGH PRESSURE 120CM (CONNECTOR) ×3 IMPLANT
VALVE AORTIC EVOLUT PRO 29MM (Prosthesis & Implant Heart) ×3 IMPLANT
WIRE AMPLATZ SS-J .035X180CM (WIRE) ×6 IMPLANT
WIRE BENTSON .035X145CM (WIRE) ×3 IMPLANT

## 2017-04-23 NOTE — Anesthesia Procedure Notes (Addendum)
Central Venous Catheter Insertion Performed by: Rica Koyanagi, anesthesiologist Start/End8/21/2018 12:45 PM, 04/23/2017 1:05 PM Patient location: Pre-op. Preanesthetic checklist: patient identified, IV checked, site marked, risks and benefits discussed, surgical consent, monitors and equipment checked, pre-op evaluation and timeout performed Position: Trendelenburg Lidocaine 1% used for infiltration and patient sedated Hand hygiene performed , maximum sterile barriers used  and Seldinger technique used Catheter size: 7.5 Fr Central line was placed.Double lumen Procedure performed using ultrasound guided technique. Ultrasound Notes:anatomy identified, needle tip was noted to be adjacent to the nerve/plexus identified, no ultrasound evidence of intravascular and/or intraneural injection and image(s) printed for medical record Attempts: 1 Following insertion, line sutured, dressing applied and Biopatch. Post procedure assessment: blood return through all ports and no air  Patient tolerated the procedure well with no immediate complications.

## 2017-04-23 NOTE — Interval H&P Note (Signed)
History and Physical Interval Note:  04/23/2017 12:53 PM  Debra Knight  has presented today for surgery, with the diagnosis of SEVERE AS  The various methods of treatment have been discussed with the patient and family. After consideration of risks, benefits and other options for treatment, the patient has consented to  Procedure(s) with comments: TRANSCATHETER AORTIC VALVE REPLACEMENT, TRANSFEMORAL (N/A) - MEDTRONIC VALVE TRANSESOPHAGEAL ECHOCARDIOGRAM (TEE) (N/A) as a surgical intervention .  The patient's history has been reviewed, patient examined, no change in status, stable for surgery.  I have reviewed the patient's chart and labs.  Questions were answered to the patient's satisfaction.     [name]

## 2017-04-23 NOTE — Anesthesia Procedure Notes (Signed)
Procedure Name: Intubation Date/Time: 04/23/2017 2:04 PM Performed by: Mariea Clonts Pre-anesthesia Checklist: Patient identified, Emergency Drugs available, Suction available and Patient being monitored Patient Re-evaluated:Patient Re-evaluated prior to induction Oxygen Delivery Method: Circle System Utilized Preoxygenation: Pre-oxygenation with 100% oxygen Induction Type: IV induction Ventilation: Mask ventilation without difficulty Laryngoscope Size: Miller and 2 Grade View: Grade I Tube type: Oral Tube size: 7.0 mm Number of attempts: 1 Airway Equipment and Method: Stylet and Oral airway Placement Confirmation: ETT inserted through vocal cords under direct vision,  positive ETCO2 and breath sounds checked- equal and bilateral Tube secured with: Tape Dental Injury: Teeth and Oropharynx as per pre-operative assessment

## 2017-04-23 NOTE — Interval H&P Note (Signed)
History and Physical Interval Note:  04/23/2017 11:26 AM  Debra Knight  has presented today for surgery, with the diagnosis of SEVERE AS  The various methods of treatment have been discussed with the patient and family. After consideration of risks, benefits and other options for treatment, the patient has consented to  Procedure(s) with comments: TRANSCATHETER AORTIC VALVE REPLACEMENT, TRANSFEMORAL (N/A) - MEDTRONIC VALVE TRANSESOPHAGEAL ECHOCARDIOGRAM (TEE) (N/A) as a surgical intervention .  The patient's history has been reviewed, patient examined, no change in status, stable for surgery.  I have reviewed the patient's chart and labs.  Questions were answered to the patient's satisfaction.     [name]

## 2017-04-23 NOTE — Anesthesia Preprocedure Evaluation (Addendum)
Anesthesia Evaluation  Patient identified by MRN, date of birth, ID band Patient awake    Reviewed: Allergy & Precautions, NPO status , Patient's Chart, lab work & pertinent test results  Airway Mallampati: II  TM Distance: <3 FB Neck ROM: Full  Mouth opening: Limited Mouth Opening  Dental  (+) Missing, Poor Dentition, Dental Advisory Given   Pulmonary sleep apnea , former smoker,    breath sounds clear to auscultation       Cardiovascular hypertension, + Valvular Problems/Murmurs  Rhythm:Regular Rate:Normal + Systolic murmurs    Neuro/Psych    GI/Hepatic GERD  ,  Endo/Other    Renal/GU      Musculoskeletal   Abdominal   Peds  Hematology   Anesthesia Other Findings   Reproductive/Obstetrics                            Anesthesia Physical Anesthesia Plan  ASA: IV  Anesthesia Plan: General   Post-op Pain Management:    Induction: Intravenous  PONV Risk Score and Plan: 4 or greater and Ondansetron, Dexamethasone, Midazolam, Scopolamine patch - Pre-op, Propofol infusion and Treatment may vary due to age or medical condition  Airway Management Planned: Oral ETT  Additional Equipment: Arterial line and CVP  Intra-op Plan:   Post-operative Plan: Extubation in OR and Possible Post-op intubation/ventilation  Informed Consent: I have reviewed the patients History and Physical, chart, labs and discussed the procedure including the risks, benefits and alternatives for the proposed anesthesia with the patient or authorized representative who has indicated his/her understanding and acceptance.   Dental advisory given  Plan Discussed with: CRNA  Anesthesia Plan Comments:         Anesthesia Quick Evaluation

## 2017-04-23 NOTE — Anesthesia Procedure Notes (Addendum)
Arterial Line Insertion Start/End8/21/2018 12:00 PM, 04/23/2017 12:05 PM Performed by: Leda Gauze, Palak Tercero E, CRNA  Lidocaine 1% used for infiltration radial was placed Catheter size: 20 G Hand hygiene performed  and maximum sterile barriers used   Attempts: 1 Procedure performed without using ultrasound guided technique. Following insertion, dressing applied and Biopatch. Post procedure assessment: normal

## 2017-04-23 NOTE — Progress Notes (Signed)
  Echocardiogram Echocardiogram Transesophageal has been performed.  Debra Knight 04/23/2017, 4:28 PM

## 2017-04-23 NOTE — Transfer of Care (Signed)
Immediate Anesthesia Transfer of Care Note  Patient: Debra Knight  Procedure(s) Performed: Procedure(s) with comments: TRANSCATHETER AORTIC VALVE REPLACEMENT, TRANSFEMORAL (N/A) - MEDTRONIC VALVE TRANSESOPHAGEAL ECHOCARDIOGRAM (TEE) (N/A)  Patient Location: ICU  Anesthesia Type:General  Level of Consciousness: awake, alert  and oriented  Airway & Oxygen Therapy: Patient Spontanous Breathing and Patient connected to nasal cannula oxygen  Post-op Assessment: Report given to RN and Patient moving all extremities X 4  Post vital signs: Reviewed and stable  Last Vitals:  Vitals:   04/23/17 1535 04/23/17 1600  BP:  (!) 129/58  Pulse: (!) 57 68  Resp:    Temp:  36.5 C  SpO2:  100%    Last Pain:  Vitals:   04/23/17 1600  TempSrc: Oral      Patients Stated Pain Goal: 3 (04/16/87 7195)  Complications: No apparent anesthesia complications

## 2017-04-23 NOTE — Op Note (Signed)
[facility]   MULTIDISCIPLINARY HEART VALVE TEAM   TAVR OPERATIVE NOTE   Date of Procedure:               04/23/2017  Preoperative Diagnosis:      Severe Aortic Stenosis   Postoperative Diagnosis:    Same   Procedure:        Transcatheter Aortic Valve Replacement - Percutaneous Right Transfemoral Approach             Medtronic Evolut-Pro  (size 29 mm, model # Evolutpro29US, serial # [id number])              Co-Surgeons:            [name], MD and [name], MD   Anesthesiologist:                  Rica Koyanagi, MD  Echocardiographer:              Ena Dawley, MD  Pre-operative Echo Findings: ? Severe aortic stenosis and moderate AI ?  Normal left ventricular systolic function  Post-operative Echo Findings: ? trivial paravalvular leak ? Normal left ventricular systolic function ? Normal prosthetic transvalvular gradient of 8 mm Hg.   BRIEF CLINICAL NOTE AND INDICATIONS FOR SURGERY  The patient is a 71 year old woman with a history of obesity, hyperlipidemia, OSA, DJD in her knees s/p bilateral TKR, and aortic stenosis that has been followed by Dr. Johnsie Cancel. Her most recent echo on 01/08/2017 showed progression to severe AS with a mean gradient of 43 mm Hg and a peak of 81 mm Hg. The indexed valve area is 0.5 cm2/m2. The LVEF was 60-65% with severe basal septal hypertrophy and mild concentric LVH. There was dynamic obstruction at rest in the mid-cavity with a peak velocity of 263 cm/sec and a peak gradient of 28 mm Hg. There was mild to moderate AI. Cardiac cath on 01/15/2017 showed a mean transvalvular gradient of 32 mm Hg and a peak of 48 mm Hg. There was an 80% proximal LCX stenosis before the OM1 and non-obstructive disease in the RCA and LAD.   She has stage C severe, asymptomatic aortic stenosis. She also has an 80% LCX stenosis but no anginal symptoms. Her STS PROM for AVR and CABG is only 1.6% but I think she would have a  slow, difficult recovery due to her obesity and DJD. She says she had a tough time recovering after her knee replacements. Her annulus is also relatively small for her BSA of 2.2 and I suspect that she would need a root replacement to get an adequately sized valve to prevent patient-prosthesis mismatch. TAVR with a 23 mm Sapien 3 valve would give her a slightly larger valve but still small for her BSA. She would certainly have a smoother and quicker recovery after TAVR. I think a Medtronic Evolut valve is probably the best option for her. We discussed complications that might develop including but not limited to risks of death, stroke, paravalvular leak, aortic dissection or other major vascular complications, aortic annulus rupture, device embolization, cardiac rupture or perforation, mitral regurgitation, acute myocardial infarction, arrhythmia, heart block or bradycardia requiring permanent pacemaker placement, congestive heart failure, respiratory failure, renal failure, pneumonia, infection, other late complications related to structural valve deterioration or migration, or other complications that might ultimately cause a temporary or permanent loss of functional independence or other long term morbidity. The patient provides full informed consent for the procedure  as described and all questions were answered.     DETAILS OF THE OPERATIVE PROCEDURE  PREPARATION:    The patient is brought to the operating room on the above mentioned date and central monitoring was established by the anesthesia team including placement of a central venous line and radial arterial line. The patient is placed in the supine position on the operating table.  Intravenous antibiotics are administered. General endotracheal anesthesia is induced uneventfully.  A Foley catheter is placed.  Baseline transesophageal echocardiogram was performed. The patient's chest, abdomen, both groins, and both lower extremities are  prepared and draped in a sterile manner. A time out procedure is performed.   PERIPHERAL ACCESS:    Using the modified Seldinger technique, femoral arterial and venous access was obtained with placement of 6 Fr sheaths on the left side.  A pigtail diagnostic catheter was passed through the left arterial sheath under fluoroscopic guidance into the aortic root.  A temporary transvenous pacemaker catheter was passed through the left femoral venous sheath under fluoroscopic guidance into the right ventricle.  The pacemaker was tested to ensure stable lead placement and pacemaker capture.    TRANSFEMORAL ACCESS:   Percutaneous transfemoral access and sheath placement was performed by Dr. Burt Knack using ultrasound guidance.  The right common femoral artery was cannulated using a micropuncture needle.  A pair of Abbott Perclose percutaneous closure devices were placed and a 6 French sheath replaced into the femoral artery.  The patient was heparinized systemically and ACT verified > 250 seconds.    A 16 Fr transfemoral Gore Dry-Seal sheath was introduced into the right femoral artery after progressively dilating over an Amplatz superstiff wire. An AL-1 catheter was used to direct a straight-tip exchange length wire across the native aortic valve into the left ventricle. This was exchanged out for a pigtail catheter and position was confirmed in the LV apex. Simultaneous LV and Ao pressures were recorded.  The pigtail catheter was exchanged for an Amplatz Super-stiff wire in the LV apex.  Echocardiography was utilized to confirm appropriate wire position and no sign of entanglement in the mitral subvalvular apparatus.   BALLOON AORTIC VALVULOPLASTY:   Not performed  TRANSCATHETER HEART VALVE DEPLOYMENT:   A Medtronic Evolut-Pro transcatheter heart valve (size 26 mm, model #[id number], serial #[id number]) was prepared and loaded into an EnVeo PRO delivery catheter system per manufacturer's  guidelines and the proper orientation of the valve is confirmed under fluoroscopy. The 83F shealth was removed and the delivery system and inline sheath were inserted into the right common femoral artery over the Super-stiff wire and the inline sheath advanced into the abdominal aorta under fluoroscopic guidance. The delivery catheter was advanced around the aortic arch and the valve was carefully positioned across the aortic valve annulus. An aortic root injection was performed to confirm position and the valve deployed using the normal technique under fluoroscopic guidance. Intermittent pacing was used during valve deployment. The delivery system and guidewire were retracted into the descending aorta and the nosecone re-sheathed. Valve function is assessed using echocardiography. There is felt to be trivial paravalvular leak and no central aortic insufficiency. The patient's hemodynamic recovery following valve deployment is good.      PROCEDURE COMPLETION:   The delivery system and in-line sheath were removed and femoral artery closure performed by Dr Burt Knack.  Protamine was administered once femoral arterial repair was complete. The temporary pacemaker, pigtail catheters and femoral sheaths were removed with manual pressure used for hemostasis.   The  patient tolerated the procedure well and is transported to the surgical intensive care in stable condition. There were no immediate intraoperative complications. All sponge instrument and needle counts are verified correct at completion of the operation.   No blood products were administered during the operation.  The patient received a total of 80 mL of intravenous contrast during the procedure.   [name], MD

## 2017-04-23 NOTE — Op Note (Signed)
[facility]   MULTIDISCIPLINARY HEART VALVE TEAM   TAVR OPERATIVE NOTE   Date of Procedure:  04/23/2017  Preoperative Diagnosis: Severe Aortic Stenosis   Postoperative Diagnosis: Same   Procedure:   Transcatheter Aortic Valve Replacement - Percutaneous Transfemoral Approach  Medtronic Evolut Pro (size 29 mm,  serial # [id number])   Co-Surgeons:  Gilford Raid, MD and [name], MD  Assistants:   Darylene Price, MD and Lauree Chandler, MD  Anesthesiologist:  Rica Koyanagi, MD  Echocardiographer:  Ena Dawley, MD  Pre-operative Echo Findings:  Severe aortic stenosis  Normal left ventricular systolic function  Post-operative Echo Findings:  trivial paravalvular leak  Normal left ventricular systolic function  BRIEF CLINICAL NOTE AND INDICATIONS FOR SURGERY  71 yo woman with obesity and CAD who has developed severe symptomatic aortic stenosis. She is felt to be at least moderate risk of conventional aortic valve replacement especially considering her relatively small aortic root and possible need for root enlargement. She is evaluated by the multidisciplinary heart team and felt to be a reasonable candidate for TAVR.  During the course of the patient's preoperative work up they have been evaluated comprehensively by a multidisciplinary team of specialists coordinated through the [facility] in the [place] [facility].  They have been demonstrated to suffer from symptomatic severe aortic stenosis as noted above. The patient has been counseled extensively as to the relative risks and benefits of all options for the treatment of severe aortic stenosis including long term medical therapy, conventional surgery for aortic valve replacement, and transcatheter aortic valve replacement.  The patient has been independently evaluated by two cardiac surgeons including Dr. Roxy Manns and Dr. Cyndia Bent, and they are felt to be at  moderate risk for conventional surgical aortic valve replacement.  Based upon review of all of the patient's preoperative diagnostic tests they are felt to be candidate for transcatheter aortic valve replacement using the transfemoral approach as an alternative to high risk conventional surgery.    Following the decision to proceed with transcatheter aortic valve replacement, a discussion has been held regarding what types of management strategies would be attempted intraoperatively in the event of life-threatening complications, including whether or not the patient would be considered a candidate for the use of cardiopulmonary bypass and/or conversion to open sternotomy for attempted surgical intervention.  The patient has been advised of a variety of complications that might develop peculiar to this approach including but not limited to risks of death, stroke, paravalvular leak, aortic dissection or other major vascular complications, aortic annulus rupture, device embolization, cardiac rupture or perforation, acute myocardial infarction, arrhythmia, heart block or bradycardia requiring permanent pacemaker placement, congestive heart failure, respiratory failure, renal failure, pneumonia, infection, other late complications related to structural valve deterioration or migration, or other complications that might ultimately cause a temporary or permanent loss of functional independence or other long term morbidity.  The patient provides full informed consent for the procedure as described and all questions were answered preoperatively.    DETAILS OF THE OPERATIVE PROCEDURE  PREPARATION:    The patient is brought to the operating room on the above mentioned date and central monitoring was established by the anesthesia team including placement of a central venous line and radial arterial line. The patient is placed in the supine position on the operating table.  Intravenous antibiotics are administered.   General endotracheal anesthesia is induced uneventfully.  A Foley catheter is placed.  Baseline transesophageal echocardiogram was  performed. The patient's chest, abdomen, both groins, and both lower extremities are prepared and draped in a sterile manner. A time out procedure is performed.   PERIPHERAL ACCESS:    Using the modified Seldinger technique with ultrasound guidance, femoral arterial and venous access was obtained with placement of 6 Fr sheaths on the left side.  A pigtail diagnostic catheter was passed through the left femoral arterial sheath under fluoroscopic guidance into the aortic root.  A temporary transvenous pacemaker catheter was passed through the left femoral venous sheath under fluoroscopic guidance into the right ventricle.  The pacemaker was tested to ensure stable lead placement and pacemaker capture. Aortic root angiography was performed in order to determine the optimal angiographic angle for valve deployment.   TRANSFEMORAL ACCESS:  A micropuncture technique is used to access the right femoral artery under fluoroscopic and ultrasound guidance. 2 Perclose devices are deployed at 10' and 2' positions to 'PreClose' the femoral artery. An 8 French sheath is placed and then an Amplatz Superstiff wire is advanced through the sheath. This is changed out for a 16 [place] Bard Dry-Seal Sheath after progressively dilating over the Superstiff wire.  An AL-1 catheter was used to direct a straight-tip exchange length wire across the native aortic valve into the left ventricle. This was exchanged out for a pigtail catheter and position was confirmed in the LV apex. Simultaneous LV and Ao pressures were recorded.  The pigtail catheter was exchanged for a Confida wire in the LV apex.  Echocardiography was utilized to confirm appropriate wire position and no sign of entanglement in the mitral subvalvular apparatus.  TRANSCATHETER HEART VALVE DEPLOYMENT:  A Medtronic Evolut Pro transcatheter  heart valve (size 29 mm, serial #[id number]) was prepared and crimped per manufacturer's guidelines, and the proper orientation of the valve is confirmed under fluoroscopic evaluation. The 16 Fr sheath is removed and the In-line sheath is inserted with the valve system. The valve was carefully positioned across the aortic valve annulus. The valve is deployed using normal technique with intermittent rapid pacing at 130 bpm. There is felt to be trace paravalvular leak and no central aortic insufficiency.  The patient's hemodynamic recovery following valve deployment is good.  The deployment balloon and guidewire are both removed. Echo demostrated acceptable post-procedural gradients, stable mitral valve function, and trace aortic insufficiency.   PROCEDURE COMPLETION:  The sheath was removed and femoral artery closure is performed using the 2 previously deployed Perclose devices.  Protamine was administered once femoral arterial repair was complete. The temporary pacemaker, pigtail catheters and femoral sheaths were removed with manual pressure used for hemostasis.   The patient tolerated the procedure well and is transported to the surgical intensive care in stable condition. There were no immediate intraoperative complications. All sponge instrument and needle counts are verified correct at completion of the operation.   The patient received a total of 80 mL of intravenous contrast during the procedure.   [name], MD 04/23/2017 5:29 PM

## 2017-04-24 ENCOUNTER — Inpatient Hospital Stay (HOSPITAL_COMMUNITY): Payer: Medicare Other

## 2017-04-24 ENCOUNTER — Encounter (HOSPITAL_COMMUNITY): Payer: Self-pay | Admitting: Physician Assistant

## 2017-04-24 DIAGNOSIS — I779 Disorder of arteries and arterioles, unspecified: Secondary | ICD-10-CM | POA: Diagnosis present

## 2017-04-24 DIAGNOSIS — I361 Nonrheumatic tricuspid (valve) insufficiency: Secondary | ICD-10-CM

## 2017-04-24 DIAGNOSIS — I371 Nonrheumatic pulmonary valve insufficiency: Secondary | ICD-10-CM

## 2017-04-24 DIAGNOSIS — I2510 Atherosclerotic heart disease of native coronary artery without angina pectoris: Secondary | ICD-10-CM | POA: Diagnosis present

## 2017-04-24 DIAGNOSIS — I350 Nonrheumatic aortic (valve) stenosis: Secondary | ICD-10-CM

## 2017-04-24 DIAGNOSIS — I739 Peripheral vascular disease, unspecified: Secondary | ICD-10-CM

## 2017-04-24 LAB — ECHOCARDIOGRAM COMPLETE
AO mean calculated velocity dopler: 140 cm/s
AOVTI: 41.4 cm
AV Area VTI: 1.62 cm2
AV Area mean vel: 1.49 cm2
AV Mean grad: 10 mmHg
AV Peak grad: 13 mmHg
AV area mean vel ind: 0.72 cm2/m2
AV peak Index: 0.79
AVA: 1.5 cm2
AVAREAVTIIND: 0.73 cm2/m2
AVCELMEANRAT: 0.84
AVLVOTPG: 11 mmHg
AVPKVEL: 183 cm/s
Ao pk vel: 0.92 m/s
CHL CUP AV VALUE AREA INDEX: 0.73
CHL CUP AV VEL: 1.5
CHL CUP MV M VEL: 109
CHL CUP RV SYS PRESS: 23 mmHg
E decel time: 224 msec
E/e' ratio: 19.42
FS: 51 % — AB (ref 28–44)
HEIGHTINCHES: 61 in
IVS/LV PW RATIO, ED: 1.22
LA diam index: 1.89 cm/m2
LA vol A4C: 59.5 ml
LA vol: 59.6 mL
LASIZE: 39 mm
LAVOLIN: 28.9 mL/m2
LEFT ATRIUM END SYS DIAM: 39 mm
LV E/e' medial: 19.42
LV E/e'average: 19.42
LV e' LATERAL: 7.21 cm/s
LVOT MV VTI INDEX: 0.59 cm2/m2
LVOT MV VTI: 1.22
LVOT VTI: 35 cm
LVOT area: 1.77 cm2
LVOT diameter: 15 mm
LVOT peak VTI: 0.85 cm
LVOT peak vel: 168 cm/s
LVOTSV: 62 mL
MV Annulus VTI: 50.7 cm
MV Dec: 224
MV pk A vel: 177 m/s
MV pk E vel: 140 m/s
MVAP: 3.06 cm2
MVPG: 8 mmHg
Mean grad: 6 mmHg
P 1/2 time: 57 ms
PW: 9 mm — AB (ref 0.6–1.1)
RV LATERAL S' VELOCITY: 12.9 cm/s
RV TAPSE: 23.9 mm
Reg peak vel: 226 cm/s
TDI e' lateral: 7.21
TDI e' medial: 6.22
TR max vel: 226 cm/s
WEIGHTICAEL: 3918.4 [oz_av]

## 2017-04-24 LAB — BASIC METABOLIC PANEL
Anion gap: 6 (ref 5–15)
BUN: 7 mg/dL (ref 6–20)
CALCIUM: 8.1 mg/dL — AB (ref 8.9–10.3)
CO2: 26 mmol/L (ref 22–32)
CREATININE: 0.71 mg/dL (ref 0.44–1.00)
Chloride: 108 mmol/L (ref 101–111)
GFR calc Af Amer: >60 mL/min (ref >60)
GLUCOSE: 164 mg/dL — AB (ref 65–99)
Potassium: 3.8 mmol/L (ref 3.5–5.1)
Sodium: 140 mmol/L (ref 135–145)

## 2017-04-24 LAB — CBC
HEMATOCRIT: 34.6 % — AB (ref 36.0–46.0)
Hemoglobin: 11.1 g/dL — ABNORMAL LOW (ref 12.0–15.0)
MCH: 28.3 pg (ref 26.0–34.0)
MCHC: 32.1 g/dL (ref 30.0–36.0)
MCV: 88.3 fL (ref 78.0–100.0)
PLATELETS: 203 10*3/uL (ref 150–400)
RBC: 3.92 MIL/uL (ref 3.87–5.11)
RDW: 15 % (ref 11.5–15.5)
WBC: 7.2 10*3/uL (ref 4.0–10.5)

## 2017-04-24 LAB — MAGNESIUM: MAGNESIUM: 1.8 mg/dL (ref 1.7–2.4)

## 2017-04-24 MED ORDER — ASPIRIN EC 81 MG PO TBEC: 81.0000 mg | DELAYED_RELEASE_TABLET | Freq: Every day | ORAL | Status: DC

## 2017-04-24 MED ORDER — CHLORHEXIDINE GLUCONATE CLOTH 2 % EX PADS
6.0000 | MEDICATED_PAD | Freq: Every day | CUTANEOUS | Status: DC
  Administered 2017-04-24: 6 via TOPICAL

## 2017-04-24 MED ORDER — PHENOL 1.4 % MT LIQD: 1.0000 | OROMUCOSAL | Status: DC | PRN

## 2017-04-24 MED ORDER — ONDANSETRON HCL 4 MG/2ML IJ SOLN: 4.0000 mg | Freq: Four times a day (QID) | INTRAMUSCULAR | Status: DC | PRN

## 2017-04-24 MED ORDER — ACETAMINOPHEN 325 MG PO TABS
650.0000 mg | ORAL_TABLET | ORAL | Status: DC | PRN
  Administered 2017-04-24: 650 mg via ORAL
  Filled 2017-04-24: qty 2

## 2017-04-24 MED ORDER — LIVING WELL WITH DIABETES BOOK
Freq: Once | Status: AC
  Administered 2017-04-24: 1
  Filled 2017-04-24: qty 1

## 2017-04-24 MED ORDER — MUPIROCIN 2 % EX OINT
1.0000 "application " | TOPICAL_OINTMENT | Freq: Two times a day (BID) | CUTANEOUS | Status: DC
  Administered 2017-04-24: 1 via NASAL
  Filled 2017-04-24: qty 22

## 2017-04-24 NOTE — Progress Notes (Signed)
Inpatient Diabetes Program Recommendations  AACE/ADA: New Consensus Statement on Inpatient Glycemic Control (2015)  Target Ranges:  Prepandial:   less than 140 mg/dL      Peak postprandial:   less than 180 mg/dL (1-2 hours)      Critically ill patients:  140 - 180 mg/dL   Lab Results  Component Value Date   HGBA1C 7.0 (H) 04/18/2017    Review of Glycemic Control  Diabetes history: None Outpatient Diabetes medications: None Current orders for Inpatient glycemic control: None   Inpatient Diabetes Program Recommendations:   Add CHO mod med to heart healthy diet Will send Living Well With Diabetes book.  Spoke with pt about HgbA1C of 7.0%. Pt states she has been very sedentary for past 6 months and gained weight. Discussed diagnosis of DM and importance of getting a PCP to manage her diabetes. Pt is not interested in OP Diabetes Education. Seems to be motivated to make lifestyle changes to control her blood sugars.  Will continue to follow.  Thank you. Lorenda Peck, RD, LDN, CDE Inpatient Diabetes Coordinator [phone]

## 2017-04-24 NOTE — Discharge Summary (Signed)
Discharge Summary    Patient ID: Debra Knight,  MRN: [id number], DOB/AGE: 12-08-1945 71 y.o.  Admit date: 04/23/2017 Discharge date: 04/25/2017  Primary Care Provider: Patient, No Pcp Per Primary Cardiologist: Dr. Johnsie Cancel / Dr. Burt Knack (TAVR)   Discharge Diagnoses    Principal Problem:   Aortic stenosis, severe Active Problems:   OSA (obstructive sleep apnea)   Hyperlipidemia   HTN (hypertension)   Carotid artery disease (HCC)   CAD (coronary artery disease)   Allergies Allergies  Allergen Reactions  . Advil [Ibuprofen] Anaphylaxis, Swelling and Other (See Comments)    Throat swelling per patient  . Penicillins Hives, Swelling and Other (See Comments)    Tongue swelling Has patient had a PCN reaction causing immediate rash, facial/tongue/throat swelling, SOB or lightheadedness with hypotension: yes Has patient had a PCN reaction causing severe rash involving mucus membranes or skin necrosis: no Has patient had a PCN reaction that required hospitalization no Has patient had a PCN reaction occurring within the last 10 years: no If all of the above answers are "NO", then may proceed with Cepha  . Demerol [Meperidine] Nausea And Vomiting  . Other Hives and Other (See Comments)    Duck  . Cortisone Anxiety and Other (See Comments)    Loopy      History of Present Illness    Debra Knight is a 71 y.o. female with a history of CAD, HLD, obesity, DJD and severe symptomatic aortic stenosis who presented to [facility] on 04/23/17 for planned TAVR.   Patient states that she was first discovered to have a heart murmur on physical exam when she was being evaluated for obstructive sleep apnea. She was referred to Dr. Johnsie Cancel who has been following her for the last several years.  Echocardiogram performed in 2017 revealed moderate aortic stenosis with normal left ventricular systolic function. Follow-up echocardiogram performed 01/08/2017 revealed significant progression of disease  with peak velocity across the aortic valve measured 4.5 m/s corresponding to mean transvalvular gradient estimated 43 mmHg and aortic valve area calculated only 0.47 cm. Left ventricular systolic function remained normal with ejection fraction estimated 60-65%.  Diagnostic cardiac catheterization was performed by Dr. Burt Knack on 01/15/2017. This confirmed the presence of severe aortic stenosis with peak to peak and mean transvalvular gradients measured 48 and 32 mmHg respectively. The patient was also noted to have severe single-vessel coronary artery disease with 80% stenosis involving the left circumflex coronary artery.  The patient was referred for surgical consultation by Dr. Cyndia Bent and felt the patient would be at at least moderate risk for conventional surgery because of her age, comorbid medical conditions, limited mobility with physical deconditioning, and particularly because of the presence of a relatively small size aortic root. He felt that aortic root enlargement or root replacement likely be necessary at the time of conventional surgery to avoid the need for the development of patient prosthesis mismatch. She also denied any symptoms of angina.  It was ultimately decided that TAVR would be the best course of action given small aortic root and at least moderate risk for conventional surgery. Surgery was delayed until the Medtronic CoreValve Evolut Pro was available for use at our facility on 04/23/17.   Hospital Course     Consultants: none   Severe AS: she underwent successful TAVR with a 29 mm Medtronic CoreValve Evolut Pro under general anaesthesia via right TF approach on 04/23/17. 2D ECHO showed normal valve placement with no significant PVL. Tele with no  bradyarrhythmia or heart block. She was doing well postoperatively day #2 and ambulating well. Plan for discharge today with 1 week TOC follow up in office and 1 month follow up with an echo. Continue ASA/plavix.   HTN: BP under good  control  HLD: continue statin   CAD: no angina. Continue ASA/plavix, statin and BB.  Anemia: mild, expected post operatively    The patient has had an uncomplicated hospital course and is recovering well. The femoral catheter sites are stable. She has been seen by Dr. Burt Knack today and deemed ready for discharge home. All follow-up appointments have been scheduled. Discharge medications are listed below.  _____________  Discharge Vitals Blood pressure (!) 150/65, pulse 86, temperature 98.4 F (36.9 C), temperature source Oral, resp. rate 16, height 5\' 1"  (1.549 m), weight 243 lb 6.2 oz (110.4 kg), SpO2 95 %.  Filed Weights   04/23/17 1600 04/24/17 0500 04/25/17 0602  Weight: 244 lb 0.8 oz (110.7 kg) 244 lb 14.4 oz (111.1 kg) 243 lb 6.2 oz (110.4 kg)    Labs & Radiologic Studies     CBC  Recent Labs  04/23/17 1630 04/24/17 0337  WBC 8.4 7.2  HGB 11.5* 11.1*  HCT 35.9* 34.6*  MCV 86.3 88.3  PLT 218 761   Basic Metabolic Panel  Recent Labs  04/23/17 1539 04/23/17 1628 04/24/17 0337  NA 141 142 140  K 3.4* 3.7 3.8  CL 106  --  108  CO2  --   --  26  GLUCOSE 177* 160* 164*  BUN 10  --  7  CREATININE 0.40*  --  0.71  CALCIUM  --   --  8.1*  MG  --   --  1.8   Thyroid Function Tests No results for input(s): TSH, T4TOTAL, T3FREE, THYROIDAB in the last 72 hours.  Invalid input(s): FREET3  Dg Chest 2 View  Result Date: 04/18/2017 CLINICAL DATA:  Aortic valve replacement. EXAM: CHEST  2 VIEW COMPARISON:  CT 03/19/2017 . FINDINGS: Mediastinum and hilar structures are normal. Heart size normal. No focal infiltrate. No pleural effusion or pneumothorax. Left anterior fourth and fifth old rib fractures are noted. IMPRESSION: No acute cardiopulmonary disease. Electronically Signed   By: Marcello Moores  Register   On: 04/18/2017 15:24   Dg Chest Port 1 View  Result Date: 04/23/2017 CLINICAL DATA:  Aortic valve replacement. EXAM: PORTABLE CHEST 1 VIEW COMPARISON:  04/18/2017.  FINDINGS: Right IJ line stable position. Prior aortic valve replacement. Cardiomegaly. No evidence for overt congestive heart failure. Mild basilar atelectasis. No pleural effusion or pneumothorax. IMPRESSION: 1. Right IJ line noted with tip projected over superior vena cava. 2. Aortic valve replacement. Cardiomegaly. No evidence of congestive heart failure. 3. Mild basilar atelectasis. Electronically Signed   By: Marcello Moores  Register   On: 04/23/2017 16:37     Diagnostic Studies/Procedures    04/23/17 Procedure:  Transcatheter Aortic Valve Replacement - Percutaneous RightTransfemoral Approach Medtronic Evolut-Pro (size 20mm, model # Evolutpro29US, serial # [id number])  Co-Surgeons:Bryan Alveria Apley, MD and [name], MD   Anesthesiologist:Terry Orene Desanctis, MD  Dala Dock, MD  Pre-operative Echo Findings: ? Severe aortic stenosis and moderate AI ? Normalleft ventricular systolic function  Post-operative Echo Findings: ? trivialparavalvular leak ? Normalleft ventricular systolic function ? Normal prosthetic transvalvular gradient of 8 mm Hg.  _____________  2D ECHO 04/24/17  Study Conclusions  - Left ventricle: There was mild concentric hypertrophy. Systolic   function was normal. The estimated ejection fraction was in the  range of 60% to 65%. The study is not technically sufficient to   allow evaluation of LV diastolic function. - Aortic valve: Post TAVR normal appearing Medtronic Resolut 29 mm   valve with no signficant peri valvular regurgitation. Valve area   (VTI): 1.5 cm^2. Valve area (Vmax): 1.62 cm^2. Valve area   (Vmean): 1.49 cm^2. - Mitral valve: Severely calcified annulus. Moderately thickened,   moderately calcified leaflets . The findings are consistent with   mild stenosis. Valve area by continuity equation (using LVOT   flow): 1.22 cm^2. - Left atrium: The atrium  was moderately dilated. - Atrial septum: No defect or patent foramen ovale was identified.  Disposition   Pt is being discharged home today in good condition.  Follow-up Plans & Appointments    Follow-up Information    Eileen Stanford, PA-C. Go on 05/02/2017.   Specialties:  Cardiology, Radiology Why:  @ 2:30pm, please arrive at least 10 minutes early Contact information: [address] [place] [address] [phone]        [name], MD Follow up on 05/22/2017.   Specialty:  Cardiology Why:  at 3pm for your follow up echo and appt with Dr. Burt Knack.  Contact information: [address] [address] [phone]          Discharge Instructions    Amb Referral to Cardiac Rehabilitation    Complete by:  As directed    To [facility] CRPII   Diagnosis:  Valve Replacement   Valve:  Aortic Comment - TAVR   Call MD for:  redness, tenderness, or signs of infection (pain, swelling, redness, odor or green/yellow discharge around incision site)    Complete by:  As directed    Diet - low sodium heart healthy    Complete by:  As directed    Discharge instructions    Complete by:  As directed    ACTIVITY AND EXERCISE . Daily activity and exercise are an important part of your recovery. People recover at different rates depending on their general health and type of valve procedure. . Most people require six to 10 weeks to feel recovered. . No lifting, pushing, pulling more than 10 pounds (examples to avoid: groceries, vacuuming, gardening, golfing):  - For one week with a procedure through the groin.  - For six weeks for procedures through the chest wall.  - For three months for procedures through the breast-bone. NOTE: You will typically see one of our providers 7-10 days after your procedure to discuss [place] the above activities.    DRIVING . Do not drive for four weeks after the date of your procedure. . If you  have been told by your doctor in the past that you may not drive, you must talk with him/her before you begin driving again. . When you resume driving, you must have someone with you.   DRESSING . Groin site: you may leave the clear dressing over the site for up to one week or until it falls off.   HYGIENE . If you had a femoral (leg) procedure, you may take a shower when you return home. After the shower, pat the site dry. Do NOT use powder, oils or lotions in your groin area until the site has completely healed. . If you had a chest procedure, you may shower when you return home unless specifically instructed not to by your discharging practitioner.  - DO NOT scrub incision; pat dry with  a towel  - DO NOT apply any lotions, oils, powders to the incision  - No tub baths / swimming for at least six weeks. . If you notice any fevers, chills, increased pain, swelling, bleeding or pus, please contact your doctor.  ADDITIONAL INFORMATION . If you are going to have an upcoming dental procedure, please contact our office as you may require antibiotics ahead of time to prevent infection on your heart valve.   Increase activity slowly    Complete by:  As directed       Discharge Medications     Medication List    STOP taking these medications   naproxen sodium 220 MG tablet Commonly known as:  ANAPROX     TAKE these medications   aspirin EC 81 MG tablet Take 81 mg by mouth every evening.   atorvastatin 40 MG tablet Commonly known as:  LIPITOR TAKE ONE TABLET BY MOUTH ONCE DAILY What changed:  See the new instructions.   BIOTIN PO Take 1 tablet by mouth daily.   clindamycin 300 MG capsule Commonly known as:  CLEOCIN Take 300 mg by mouth 3 (three) times daily.   clopidogrel 75 MG tablet Commonly known as:  PLAVIX Take 1 tablet (75 mg total) by mouth daily with breakfast.   metoprolol tartrate 25 MG tablet Commonly known as:  LOPRESSOR Take 0.5 tablets (12.5 mg total) by  mouth 2 (two) times daily.   multivitamin with minerals Tabs tablet Take 1 tablet by mouth daily.       Outstanding Labs/Studies   Follow up echo in one month.   Duration of Discharge Encounter   Greater than 30 minutes including physician time.  Signed, Reino Bellis, NP-C 04/25/2017, 1:13 PM  Patient seen, examined. Available data reviewed. Agree with findings, assessment, and plan as outlined by Reino Bellis, NP-C. My exam today: Vitals:   04/25/17 0743 04/25/17 0936  BP: (!) 143/60 (!) 150/65  Pulse: 83 86  Resp: 16   Temp: 98.4 F (36.9 C)   SpO2: 95%    Pt is alert and oriented, NAD HEENT: normal Neck: JVP - normal Lungs: CTA bilaterally CV: RRR with 2/6 SEM at the RUSB Abd: soft, NT, Positive BS, no hepatomegaly Ext: no C/C/E, distal pulses intact and equal Skin: warm/dry no rash  Echo is reviewed and demonstrates normal function of her transcatheter heart valve. The patient is clinically stable and ready for discharge. A follow-up appointment has been arranged for next week. She will continue with dual antiplatelet therapy on aspirin and Plavix. Postprocedural restrictions are reviewed at length with the patient and her husband.  [name], M.D. 04/25/2017 1:14 PM

## 2017-04-24 NOTE — Progress Notes (Signed)
  Echocardiogram 2D Echocardiogram has been performed.  Debra Knight 04/24/2017, 12:34 PM

## 2017-04-24 NOTE — Progress Notes (Signed)
Pt is eating now and then to get central line out. Discussed with pt and husband CRPII. Will refer her to [facility]. Will f/u tomorrow for ambulation and walking guidelines. Harvey, ACSM 1:50 PM 04/24/2017

## 2017-04-24 NOTE — Progress Notes (Signed)
Progress Note  Patient Name: Debra Knight Date of Encounter: 04/24/2017  Primary Cardiologist: Johnsie Cancel  Subjective   Some chest discomfort but feels like something is "stuck in her throat." She feels some discomfort with breathing. Otherwise doing well with no shortness of breath. No heart palpitations or other complaints.  Inpatient Medications    Scheduled Meds: . aspirin EC  81 mg Oral QPM  . atorvastatin  40 mg Oral Daily  . Chlorhexidine Gluconate Cloth  6 each Topical Daily  . clopidogrel  75 mg Oral Q breakfast  . living well with diabetes book   Does not apply Once  . metoprolol tartrate  12.5 mg Oral BID   Or  . metoprolol tartrate  12.5 mg Per Tube BID  . mupirocin ointment  1 application Nasal BID  . [START ON 04/25/2017] pantoprazole  40 mg Oral Daily  . sodium chloride flush  3 mL Intravenous Q12H   Continuous Infusions: . sodium chloride    . albumin human    . lactated ringers    . levofloxacin (LEVAQUIN) IV    . nitroGLYCERIN    . phenylephrine (NEO-SYNEPHRINE) Adult infusion     PRN Meds: sodium chloride, albumin human, lactated ringers, metoprolol tartrate, midazolam, morphine injection, ondansetron (ZOFRAN) IV, oxyCODONE, sodium chloride flush, traMADol   Vital Signs    Vitals:   04/24/17 0600 04/24/17 0700 04/24/17 0750 04/24/17 0800  BP: 123/60 (!) 122/55  117/64  Pulse: 77 79  77  Resp: 16 17  17   Temp:   99.3 F (37.4 C)   TempSrc:   Oral   SpO2: 92% 95%  93%  Weight:      Height:        Intake/Output Summary (Last 24 hours) at 04/24/17 1045 Last data filed at 04/24/17 0700  Gross per 24 hour  Intake          3935.82 ml  Output             2600 ml  Net          1335.82 ml   Filed Weights   04/23/17 1049 04/23/17 1600 04/24/17 0500  Weight: 244 lb 11.2 oz (111 kg) 244 lb 0.8 oz (110.7 kg) 244 lb 14.4 oz (111.1 kg)    Telemetry    Normal sinus rhythm with no arrhythmia - Personally Reviewed  ECG    Normal sinus rhythm,  within normal limits - Personally Reviewed  Physical Exam  Alert, oriented woman, comfortable, sitting up in chair GEN: No acute distress.   Neck: No JVD Cardiac: RRR, 2/6 systolic ejection murmur at the right upper sternal border Respiratory: Clear to auscultation bilaterally. GI: Soft, nontender, non-distended  MS: No edema; No deformity. Bilateral groin sites are clear Neuro:  Nonfocal  Psych: Normal affect   Labs    Chemistry Recent Labs Lab 04/18/17 1437  04/23/17 1458 04/23/17 1539 04/23/17 1628 04/24/17 0337  NA 137  < > 141 141 142 140  K 3.9  < > 3.6 3.4* 3.7 3.8  CL 103  < > 105 106  --  108  CO2 21*  --   --   --   --  26  GLUCOSE 119*  < > 180* 177* 160* 164*  BUN 14  < > 11 10  --  7  CREATININE 0.60  < > 0.40* 0.40*  --  0.71  CALCIUM 9.1  --   --   --   --  8.1*  PROT 7.1  --   --   --   --   --   ALBUMIN 3.8  --   --   --   --   --   AST 20  --   --   --   --   --   ALT 21  --   --   --   --   --   ALKPHOS 138*  --   --   --   --   --   BILITOT 0.9  --   --   --   --   --   GFRNONAA >60  --   --   --   --  >60  GFRAA >60  --   --   --   --  >60  ANIONGAP 13  --   --   --   --  6  < > = values in this interval not displayed.   Hematology Recent Labs Lab 04/18/17 1437  04/23/17 1628 04/23/17 1630 04/24/17 0337  WBC 7.5  --   --  8.4 7.2  RBC 4.64  --   --  4.16 3.92  HGB 13.2  < > 11.2* 11.5* 11.1*  HCT 39.8  < > 33.0* 35.9* 34.6*  MCV 85.8  --   --  86.3 88.3  MCH 28.4  --   --  27.6 28.3  MCHC 33.2  --   --  32.0 32.1  RDW 14.5  --   --  14.5 15.0  PLT 244  --   --  218 203  < > = values in this interval not displayed.  Cardiac EnzymesNo results for input(s): TROPONINI in the last 168 hours. No results for input(s): TROPIPOC in the last 168 hours.   BNPNo results for input(s): BNP, PROBNP in the last 168 hours.   DDimer No results for input(s): DDIMER in the last 168 hours.   Radiology    Dg Chest Port 1 View  Result Date:  04/23/2017 CLINICAL DATA:  Aortic valve replacement. EXAM: PORTABLE CHEST 1 VIEW COMPARISON:  04/18/2017. FINDINGS: Right IJ line stable position. Prior aortic valve replacement. Cardiomegaly. No evidence for overt congestive heart failure. Mild basilar atelectasis. No pleural effusion or pneumothorax. IMPRESSION: 1. Right IJ line noted with tip projected over superior vena cava. 2. Aortic valve replacement. Cardiomegaly. No evidence of congestive heart failure. 3. Mild basilar atelectasis. Electronically Signed   By: Marcello Moores  Register   On: 04/23/2017 16:37    Cardiac Studies   Postoperative day #1 echo is pending  Patient Profile     71 y.o. female with severe, stage DI aortic stenosis, admitted for TAVR 04/23/2017  Assessment & Plan    1. Severe aortic stenosis status post TAVR: Patient doing well postoperative day #1. Heart rhythm is stable on review of telemetry. Will continue aspirin and clopidogrel. Mobilize today. Transfer to a 4 E. telemetry bed. Perform 2-D echocardiogram. Anticipate hospital discharge tomorrow.  2. Anemia, mild. Expected postoperative blood loss.  3. Hyperlipidemia: Continue atorvastatin  Disposition: Transfer to a telemetry bed today. Anticipate hospital discharge tomorrow.  Deatra James, MD  04/24/2017, 10:45 AM

## 2017-04-24 NOTE — Care Management Note (Addendum)
Case Management Note  Patient Details  Name: Debra Knight MRN: [id number] Date of Birth: 06-25-1946  Subjective/Objective:  From home with spouse, pta indep, POD 1 TAVR, for transfer to SDU and for possible dc tomorrow. She has medication coverage and Dr. Johnsie Cancel is her MD.                   Action/Plan: NCM will follow for dc needs.   Expected Discharge Date:                  Expected Discharge Plan:  Home/Self Care  In-House Referral:     Discharge planning Services  CM Consult  Post Acute Care Choice:    Choice offered to:     DME Arranged:    DME Agency:     HH Arranged:    [place] Agency:     Status of Service:  Completed, signed off  If discussed at [employer] of Stay Meetings, dates discussed:    Additional Comments:  Zenon Mayo, RN 04/24/2017, 12:31 PM

## 2017-04-24 NOTE — Anesthesia Postprocedure Evaluation (Signed)
Anesthesia Post Note  Patient: Debra Knight  Procedure(s) Performed: Procedure(s) (LRB): TRANSCATHETER AORTIC VALVE REPLACEMENT, TRANSFEMORAL (N/A) TRANSESOPHAGEAL ECHOCARDIOGRAM (TEE) (N/A)     Patient location during evaluation: SICU Anesthesia Type: General Level of consciousness: sedated and awake Pain management: pain level controlled Vital Signs Assessment: post-procedure vital signs reviewed and stable Respiratory status: spontaneous breathing and respiratory function stable Cardiovascular status: stable Anesthetic complications: no    Last Vitals:  Vitals:   04/24/17 0700 04/24/17 0750  BP: (!) 122/55   Pulse: 79   Resp: 17   Temp:  37.4 C  SpO2: 95%     Last Pain:  Vitals:   04/24/17 0750  TempSrc: Oral  PainSc:                  Aleesa Sweigert,JAMES TERRILL

## 2017-04-24 NOTE — Discharge Instructions (Signed)
ACTIVITY AND EXERCISE °• Daily activity and exercise are an important part of your recovery. People recover at different rates depending on their general health and type of valve procedure. °• Most people require six to 10 weeks to feel recovered. °• No lifting, pushing, pulling more than 10 pounds (examples to avoid: groceries, vacuuming, gardening, golfing): °            - For one week with a procedure through the groin. °            - For six weeks for procedures through the chest wall. °            - For three months for procedures through the breast-bone. °NOTE: You will typically see one of our providers 7-10 days after your procedure to discuss WHEN TO RESUME the above activities.  °  °  °DRIVING °• Do not drive for four weeks after the date of your procedure. °• If you have been told by your doctor in the past that you may not drive, you must talk with him/her before you begin driving again. °• When you resume driving, you must have someone with you. °  °  °DRESSING °• Groin site: you may leave the clear dressing over the site for up to one week or until it falls off. °  °  °HYGIENE °• If you had a femoral (leg) procedure, you may take a shower when you return home. After the shower, pat the site dry. Do NOT use powder, oils or lotions in your groin area until the site has completely healed. °• If you had a chest procedure, you may shower when you return home unless specifically instructed not to by your discharging practitioner. °            - DO NOT scrub incision; pat dry with a towel °            - DO NOT apply any lotions, oils, powders to the incision °            - No tub baths / swimming for at least six weeks. °• If you notice any fevers, chills, increased pain, swelling, bleeding or pus, please contact your doctor. °  °ADDITIONAL INFORMATION °• If you are going to have an upcoming dental procedure, please contact our office as you may require antibiotics ahead of time to prevent infection on your  heart valve.  ° °Groin Site Care °Refer to this sheet in the next few weeks. These instructions provide you with information on caring for yourself after your procedure. Your caregiver may also give you more specific instructions. Your treatment has been planned according to current medical practices, but problems sometimes occur. Call your caregiver if you have any problems or questions after your procedure. °HOME CARE INSTRUCTIONS °· You may shower 24 hours after the procedure. Remove the bandage (dressing) and gently wash the site with plain soap and water. Gently pat the site dry.  °· Do not apply powder or lotion to the site.  °· Do not sit in a bathtub, swimming pool, or whirlpool for 5 to 7 days.  °· No bending, squatting, or lifting anything over 10 pounds (4.5 kg) as directed by your caregiver.  °· Inspect the site at least twice daily.  °· Do not drive home if you are discharged the same day of the procedure. Have someone else drive you.  °· You may drive 24 hours after the procedure unless otherwise instructed by your caregiver.  °What to expect: °·   Any bruising will usually fade within 1 to 2 weeks.  °· Blood that collects in the tissue (hematoma) may be painful to the touch. It should usually decrease in size and tenderness within 1 to 2 weeks.  °SEEK IMMEDIATE MEDICAL CARE IF: °· You have unusual pain at the groin site or down the affected leg.  °· You have redness, warmth, swelling, or pain at the groin site.  °· You have drainage (other than a small amount of blood on the dressing).  °· You have chills.  °· You have a fever or persistent symptoms for more than 72 hours.  °· You have a fever and your symptoms suddenly get worse.  °· Your leg becomes pale, cool, tingly, or numb.  °You have heavy bleeding from the site. Hold pressure on the site. . ° °

## 2017-04-24 NOTE — Progress Notes (Signed)
Patient transferred to [facility] room # 25. Escorted by [employer] nurse with personal belongings ( one cellular phone, one pair of glasses and clothing. See assessment

## 2017-04-24 NOTE — Progress Notes (Signed)
Pt CVC was removed. Pt tolerated well. V/S were stable. Will continue to monitor.   Arturo Morton, RN

## 2017-04-25 MED ORDER — CLOPIDOGREL BISULFATE 75 MG PO TABS: 75.0000 mg | ORAL_TABLET | Freq: Every day | ORAL | 1 refills | Status: DC

## 2017-04-25 MED ORDER — METOPROLOL TARTRATE 25 MG PO TABS: 12.5000 mg | ORAL_TABLET | Freq: Two times a day (BID) | ORAL | 1 refills | Status: DC

## 2017-04-25 MED FILL — Potassium Chloride Inj 2 mEq/ML: INTRAVENOUS | Qty: 40 | Status: AC

## 2017-04-25 MED FILL — Heparin Sodium (Porcine) Inj 1000 Unit/ML: INTRAMUSCULAR | Qty: 30 | Status: AC

## 2017-04-25 MED FILL — Magnesium Sulfate Inj 50%: INTRAMUSCULAR | Qty: 10 | Status: AC

## 2017-04-25 NOTE — Progress Notes (Signed)
      [address]       [place] [address]             [phone]      2 Days Post-Op Procedure(s) (LRB): TRANSCATHETER AORTIC VALVE REPLACEMENT, TRANSFEMORAL (N/A) TRANSESOPHAGEAL ECHOCARDIOGRAM (TEE) (N/A)   Subjective:  No complaints.  States she feels fine today.  Objective: Vital signs in last 24 hours: Temp:  [98.2 F (36.8 C)-99.9 F (37.7 C)] 98.4 F (36.9 C) (08/23 0743) Pulse Rate:  [78-84] 83 (08/23 0743) Cardiac Rhythm: Normal sinus rhythm;Heart block (08/23 0701) Resp:  [15-21] 16 (08/23 0743) BP: (112-146)/(50-98) 143/60 (08/23 0743) SpO2:  [91 %-98 %] 95 % (08/23 0743) Weight:  [243 lb 6.2 oz (110.4 kg)] 243 lb 6.2 oz (110.4 kg) (08/23 0602)  Intake/Output from previous day: 08/22 0701 - 08/23 0700 In: 870 [P.O.:720; IV Piggyback:150] Out: -   General appearance: alert, cooperative and no distress Heart: regular rate and rhythm Lungs: clear to auscultation bilaterally Abdomen: soft, non-tender; bowel sounds normal; no masses,  no organomegaly Extremities: extremities normal, atraumatic, no cyanosis or edema Wound: clean and dry  Lab Results:  Recent Labs  04/23/17 1630 04/24/17 0337  WBC 8.4 7.2  HGB 11.5* 11.1*  HCT 35.9* 34.6*  PLT 218 203   BMET:  Recent Labs  04/23/17 1539 04/23/17 1628 04/24/17 0337  NA 141 142 140  K 3.4* 3.7 3.8  CL 106  --  108  CO2  --   --  26  GLUCOSE 177* 160* 164*  BUN 10  --  7  CREATININE 0.40*  --  0.71  CALCIUM  --   --  8.1*    PT/INR:  Recent Labs  04/23/17 1630  LABPROT 14.7  INR 1.14   ABG    Component Value Date/Time   PHART 7.371 04/23/2017 1647   HCO3 24.6 04/23/2017 1647   TCO2 26 04/23/2017 1647   ACIDBASEDEF 1.0 04/23/2017 1647   O2SAT 99.0 04/23/2017 1647   CBG (last 3)  No results for input(s): GLUCAP in the last 72 hours.  Assessment/Plan: S/P Procedure(s) (LRB): TRANSCATHETER AORTIC VALVE REPLACEMENT, TRANSFEMORAL (N/A) TRANSESOPHAGEAL  ECHOCARDIOGRAM (TEE) (N/A)  1. CV-NSR, BP elevated at times- on Lopressor BID 2. Pulm- no acute issues, continue IS 3. Renal- creatinine WNL 4. Dispo- patient stable, likely for d/c home today, per cardiology   LOS: 2 days    Ahmed Prima, Junie Panning 04/25/2017

## 2017-04-25 NOTE — Progress Notes (Signed)
Pt discharged home with husband. Telemetry box and IV removed, per order. Pt received discharge instructions and all questions were answered. Pt discharged off the unit via wheelchair and was accompanied by pt's nurse tech.   Grant Fontana BSN, RN

## 2017-04-25 NOTE — Progress Notes (Signed)
Pt ambulated with NT. Seemed to be doing well, no c/o. Discussed walking at home, restrictions, and CRPII again. Pt needs to increase ex due to being deconditioned over last few months. Also gave her DM diet to review.  [id number] Yves Dill CES, ACSM 10:42 AM 04/25/2017

## 2017-04-25 NOTE — Consult Note (Signed)
[facility] CM Primary Care Navigator  04/25/2017  Debra Knight June 11, 1946 [id number]   Met with patient and husband Debra Knight) at the bedside to identify possible discharge needs. Patientreports she has a scheduled aortic valve replacement that had led to this admission. Patient endorses Dr. Jenkins Rouge (who has been following her for the last several years) with [facility] herprimary care provider (following mostly her cardiac issues).Patient was encouraged to have a primary care provider to help manage her health issues but was reluctant to do so.   Patient shared using [facility] in [place] obtain medications without any problem.   Patient verbalized that shemanageshermedications with husband's assistance at home taking straight out of the containers ("not taking much").  She states that husband provides transportation to herdoctors'appointments.   Patient's husband will be her primary caregiver at home as stated.  Anticipated discharge plan ishome per patient.Husband reports that patient will be having cardiac rehabilitation as outpatient 3 times/ week.  Patient voiced understanding to follow-up with primary providerwhen she returns home,for a post hospitalvisitwithin a week or sooner if needed.Patient letter (with PCP's contact number) was provided as a reminder.  Explained to patient and husband about [facility] CM services available for healthmanagement at home but denies any needs or concerns at this time.  Patient and husband declined [facility] offered to followup with herrecovery at home.  Patientvoiced understanding to seekreferral to [facility] care management servicesfrom primary care provider if deemed necessary and appropriate in the future.   [facility] care management information provided for future needs that may arise.   For questions, please contact:  Dannielle Huh, BSN, RN- [facility] Primary  Care Navigator  Telephone: [phone] [place]

## 2017-04-26 ENCOUNTER — Telehealth: Payer: Self-pay

## 2017-04-26 ENCOUNTER — Encounter (HOSPITAL_COMMUNITY): Payer: Self-pay | Admitting: Cardiovascular Disease

## 2017-04-26 NOTE — Telephone Encounter (Signed)
**Note De-Identified Debra Knight Obfuscation** Patient contacted regarding discharge from [facility] on 04/25/17.  Patient understands to follow up with provider Angelena Form, NP-c on 05/02/17 at 2:30 at [facility] in [place]. Patient understands discharge instructions? yes Patient understands medications and regiment? yes Patient understands to bring all medications to this visit? yes  The pt has no complaints at this time and states that she is feeling fine. She does have our phone number to call if she does have any questions or concerns.

## 2017-04-29 NOTE — Progress Notes (Signed)
Structural Heart Office Note    Date:  05/02/2017   ID:  Debra Knight, DOB [birth year], MRN [id number]  PCP:  Patient, No Pcp Per  Cardiologist:  Dr. Johnsie Cancel / Dr. Burt Knack (TAVR)  CC: 7 day follow up s/p TAVR  History of Present Illness:  Debra Knight is a 71 y.o. female with a history of CAD, HLD, obesity, DJD and severe aortic stenosis s/p TAVR (04/23/17) who presents to clinic for post hospital follow up.   She has followed by Dr. Johnsie Cancel for aortic stenosis. Echocardiogram performed in 2017 revealed moderate aortic stenosis with normal left ventricular systolic function. Follow-up echocardiogram performed 01/08/2017 revealed significant progression of disease with peak velocity across the aortic valve measured 4.5 m/s corresponding to mean transvalvular gradient estimated 43 mmHg and aortic valve area calculated only 0.47 cm. Left ventricular systolic function remained normal with ejection fraction estimated 60-65%. Diagnostic cardiac catheterization was performed by Dr. Burt Knack on 01/15/2017. This confirmed the presence of severe aortic stenosis with peak to peak and mean transvalvular gradients measured 48 and 32 mmHg respectively. The patient was also noted to have severe single-vessel coronary artery disease with 80% stenosis involving the left circumflex coronary artery. The patient was referred for surgical consultation by Dr. Cyndia Bent and felt the patient would be at at least moderate risk for conventional surgery because of her age, comorbid medical conditions, limited mobility with physical deconditioning, and particularly because of the presence of a relatively small size aortic root. He felt that aortic root enlargement or root replacement likely be necessary at the time of conventional surgery to avoid the need for the development of patient prosthesis mismatch. She also denied any symptoms of angina.   It was ultimately decided that TAVR would be the best course of action given  small aortic root and at least moderate risk for conventional surgery. Surgery was delayed until the Medtronic CoreValve Evolut Pro was available for use at our facility on 04/23/17.   She underwent successful TAVR with a 62mm Medtronic CoreValve Evolut Pro under general anaesthesia via right TF approach on 04/23/17. Postoperative echo showed normal valve placement with no significant PVL. She was started on ASA/plavix.  Today she presents to clinic for post hospital follow up. She is feeling good, but fatigued. She deals with insomnia and hasn't slept well. She has been walking around her house. She has been walking 5 minutes at least 2 x a day and doing well with this. She feels like she can breath easier since her surgery. No chest pain. No LE edema, orthopnea or PND. No palpations. No blood in stool or urine.    Past Medical History:  Diagnosis Date  . Aortic stenosis, severe    a. 04/2017: s/p TAVR wtih a Medtronic Evolut-Pro (size 32mm, model # Evolutpro29US, serial # [id number])  . CAD (coronary artery disease)    a. pre-TAVR cath 01/2017 showed 80% LCX stenosis. No angina. Plan to treat medically  . Carotid artery disease ([place])   . Enlarged thyroid   . GERD (gastroesophageal reflux disease)   . Hyperlipidemia   . OSA (obstructive sleep apnea)    no cpap   . Osteoarthritis of both knees   . Tinnitus     Past Surgical History:  Procedure Laterality Date  . BIOPSY THYROID  2015  . DILATION AND CURETTAGE OF UTERUS    . LEFT HEART CATH AND CORONARY ANGIOGRAPHY N/A 01/15/2017   Procedure: Left Heart Cath and Coronary Angiography;  Surgeon:  [name], MD;  Location: [facility];  Service: Cardiovascular;  Laterality: N/A;  . TEE WITHOUT CARDIOVERSION N/A 04/23/2017   Procedure: TRANSESOPHAGEAL ECHOCARDIOGRAM (TEE);  Surgeon: [name], MD;  Location: [place];  Service: Open Heart Surgery;  Laterality: N/A;  . TONSILLECTOMY  age 71  . TOTAL KNEE ARTHROPLASTY Right 12/13/2014    Procedure: RIGHT TOTAL KNEE ARTHROPLASTY;  Surgeon: Gaynelle Arabian, MD;  Location: [facility];  Service: Orthopedics;  Laterality: Right;  . TOTAL KNEE ARTHROPLASTY Left 02/20/2016   Procedure: LEFT TOTAL KNEE ARTHROPLASTY;  Surgeon: Gaynelle Arabian, MD;  Location: [facility];  Service: Orthopedics;  Laterality: Left;  . TRANSCATHETER AORTIC VALVE REPLACEMENT, TRANSFEMORAL N/A 04/23/2017   Procedure: TRANSCATHETER AORTIC VALVE REPLACEMENT, TRANSFEMORAL;  Surgeon: [name], MD;  Location: [place];  Service: Open Heart Surgery;  Laterality: N/A;  MEDTRONIC VALVE    Current Medications: Outpatient Medications Prior to Visit  Medication Sig Dispense Refill  . aspirin EC 81 MG tablet Take 81 mg by mouth every evening.    Marland Kitchen atorvastatin (LIPITOR) 40 MG tablet TAKE ONE TABLET BY MOUTH ONCE DAILY (Patient taking differently: TAKE ONE TABLET BY MOUTH ONCE AT BEDTIME.) 90 tablet 3  . BIOTIN PO Take 1 tablet by mouth daily.    . clopidogrel (PLAVIX) 75 MG tablet Take 1 tablet (75 mg total) by mouth daily with breakfast. 90 tablet 1  . metoprolol tartrate (LOPRESSOR) 25 MG tablet Take 0.5 tablets (12.5 mg total) by mouth 2 (two) times daily. 60 tablet 1  . Multiple Vitamin (MULTIVITAMIN WITH MINERALS) TABS tablet Take 1 tablet by mouth daily.    . clindamycin (CLEOCIN) 300 MG capsule Take 300 mg by mouth 3 (three) times daily.     No facility-administered medications prior to visit.      Allergies:   Advil [ibuprofen]; Penicillins; Demerol [meperidine]; Other; and Cortisone   Social History   Social History  . Marital status: Married    Spouse name: N/A  . Number of children: N/A  . Years of education: N/A   Occupational History  . self employed    Social History Main Topics  . Smoking status: Former Smoker    Packs/day: 2.00    Years: 15.00    Types: Cigarettes    Quit date: 09/03/1968  . Smokeless tobacco: Never Used     Comment: 1-2 ppd. , NONE IN 40 YEARS  . Alcohol use No  . Drug use: No   . Sexual activity: Not Asked   Other Topics Concern  . None   Social History Narrative  . None     Family History:  The patient's family history includes Diabetes in her maternal grandfather and maternal uncle; Hashimoto's thyroiditis in her daughter and son; Heart attack (age of onset: 55) in her mother; Heart disease in her mother; Prostate cancer in her father.      ROS:   Please see the history of present illness.    ROS All other systems reviewed and are negative.   PHYSICAL EXAM:   VS:  BP (!) 158/70   Pulse 71   Ht 5\' 1"  (1.549 m)   Wt 240 lb (108.9 kg)   SpO2 99%   BMI 45.35 kg/m    GEN: Well nourished, well developed, in no acute distress, obese HEENT: normal  Neck: no JVD, carotid bruits, or masses Cardiac: RRR; very soft flow murmur, rubs, or gallops,no edema  Respiratory:  clear to auscultation bilaterally, normal work of breathing GI:  soft, nontender, nondistended, + BS MS: no deformity or atrophy  Skin: warm and dry, no rash Neuro:  Alert and Oriented x 3, Strength and sensation are intact Psych: euthymic mood, full affect    Wt Readings from Last 3 Encounters:  05/02/17 240 lb (108.9 kg)  04/25/17 243 lb 6.2 oz (110.4 kg)  04/18/17 244 lb 11.2 oz (111 kg)      Studies/Labs Reviewed:   EKG:  EKG is ordered today.  The ekg ordered today demonstrates NSR, ILBBB HR 68  Recent Labs: 04/18/2017: ALT 21 04/24/2017: BUN 7; Creatinine, Ser 0.71; Hemoglobin 11.1; Magnesium 1.8; Platelets 203; Potassium 3.8; Sodium 140   Lipid Panel    Component Value Date/Time   CHOL 203 (H) 09/15/2015 1537   TRIG 241 (H) 09/15/2015 1537   HDL 42 (L) 09/15/2015 1537   CHOLHDL 4.8 09/15/2015 1537   VLDL 48 (H) 09/15/2015 1537   LDLCALC 113 09/15/2015 1537   LDLDIRECT 177.0 03/16/2015 1452    Additional studies/ records that were reviewed today include:    04/23/17 Procedure:  Transcatheter Aortic Valve Replacement - Percutaneous RightTransfemoral  Approach Medtronic Evolut-Pro (size 81mm, model # Evolutpro29US, serial # [id number])  Co-Surgeons:Bryan Alveria Apley, MD and [name], MD   Anesthesiologist:Terry Orene Desanctis, MD  Dala Dock, MD  Pre-operative Echo Findings: ? Severe aortic stenosis and moderate AI ? Normalleft ventricular systolic function  Post-operative Echo Findings: ? trivialparavalvular leak ? Normalleft ventricular systolic function ? Normal prosthetic transvalvular gradient of 18mm Hg.  _____________  2D ECHO 04/24/17 Study Conclusions - Left ventricle: There was mild concentric hypertrophy. Systolic function was normal. The estimated ejection fraction was in the range of 60% to 65%. The study is not technically sufficient to allow evaluation of LV diastolic function. - Aortic valve: Post TAVR normal appearing Medtronic Resolut 29 mm valve with no signficant peri valvular regurgitation. Valve area (VTI): 1.5 cm^2. Valve area (Vmax): 1.62 cm^2. Valve area (Vmean): 1.49 cm^2. - Mitral valve: Severely calcified annulus. Moderately thickened, moderately calcified leaflets . The findings are consistent with mild stenosis. Valve area by continuity equation (using LVOT flow): 1.22 cm^2. - Left atrium: The atrium was moderately dilated. - Atrial septum: No defect or patent foramen ovale was identified   ASSESSMENT & PLAN:   Severe AS s/p TAVR: doing well 1 week out from TAVR. Groin site stable. ECG with new ILBBB with no bradycardia. Plan for return in about 3 weeks with an echo and visit with Dr. Burt Knack.   HTN: BP initially elevated but 138/60 on my recheck. She will continue to monitor at home and let us know if running >140/90  HLD: continue statin   CAD: no angina. Continue ASA/plavix, statin and BB.  Anemia: mild, expected post operatively    Medication Adjustments/Labs and Tests  Ordered: Current medicines are reviewed at length with the patient today.  Concerns regarding medicines are outlined above.  Medication changes, Labs and Tests ordered today are listed in the Patient Instructions below. Patient Instructions  Medication Instructions:  Your physician recommends that you continue on your current medications as directed. Please refer to the Current Medication list given to you today.   Labwork: None Ordered   Testing/Procedures: None Ordered   Follow-Up: Your physician recommends that you schedule a follow-up appointment in: as scheduled. We will call you  Any Other Special Instructions Will Be Listed Below (If Applicable).     If you need a refill on your cardiac medications before your next appointment, please  call your pharmacy.      Signed, Angelena Form, PA-C  05/02/2017 3:33 PM    [facility] [address], [place], [place]  [address] Phone: [phone]; Fax: [phone]

## 2017-05-02 ENCOUNTER — Encounter: Payer: Self-pay | Admitting: Physician Assistant

## 2017-05-02 ENCOUNTER — Ambulatory Visit (INDEPENDENT_AMBULATORY_CARE_PROVIDER_SITE_OTHER): Payer: Medicare Other | Admitting: Physician Assistant

## 2017-05-02 VITALS — BP 158/70 | HR 71 | Ht 61.0 in | Wt 240.0 lb

## 2017-05-02 DIAGNOSIS — E785 Hyperlipidemia, unspecified: Secondary | ICD-10-CM

## 2017-05-02 DIAGNOSIS — Z952 Presence of prosthetic heart valve: Secondary | ICD-10-CM | POA: Diagnosis not present

## 2017-05-02 DIAGNOSIS — I10 Essential (primary) hypertension: Secondary | ICD-10-CM

## 2017-05-02 DIAGNOSIS — D649 Anemia, unspecified: Secondary | ICD-10-CM

## 2017-05-02 DIAGNOSIS — I2510 Atherosclerotic heart disease of native coronary artery without angina pectoris: Secondary | ICD-10-CM

## 2017-05-02 NOTE — Patient Instructions (Signed)
Medication Instructions:  Your physician recommends that you continue on your current medications as directed. Please refer to the Current Medication list given to you today.   Labwork: None Ordered   Testing/Procedures: None Ordered   Follow-Up: Your physician recommends that you schedule a follow-up appointment in: as scheduled. We will call you  Any Other Special Instructions Will Be Listed Below (If Applicable).     If you need a refill on your cardiac medications before your next appointment, please call your pharmacy.

## 2017-05-08 ENCOUNTER — Telehealth: Payer: Self-pay | Admitting: Cardiovascular Disease

## 2017-05-08 NOTE — Telephone Encounter (Signed)
New Message  Pt call requesting to speak with Debra Knight about getting new appt date for echo and cooper appt on same day. Pt states she spoke with RN and she would like to speak back with her. Please call back to discuss

## 2017-05-09 NOTE — Telephone Encounter (Signed)
F/u Message  Pt call to f/u  With RN. Please call back to discuss

## 2017-05-09 NOTE — Telephone Encounter (Signed)
I spoke with the pt and got TAVR follow-up appointments rescheduled to 9/20.  The pt also inquired about Cardiac Rehabilitation at [facility] because she has not received a call about starting. I contacted the facility at [phone] and was transferred to Cardiac Rehab. Referral was received and Jenny Reichmann is working on getting things arranged and plans to contact the patient.

## 2017-05-09 NOTE — Telephone Encounter (Signed)
Left message on machine for pt to contact the office to reschedule appointments.  Please forward phone note to me when the pt calls back.

## 2017-05-22 ENCOUNTER — Ambulatory Visit: Payer: Self-pay | Admitting: Cardiovascular Disease

## 2017-05-22 ENCOUNTER — Other Ambulatory Visit (HOSPITAL_COMMUNITY): Payer: Self-pay

## 2017-05-22 NOTE — Progress Notes (Addendum)
Cardiology Office Note    Date:  05/23/2017   ID:  Debra Knight, DOB 1946-03-20, MRN [id number]  PCP:  Patient, No Pcp Per  Cardiologist: Dr. Johnsie Cancel / Dr. Burt Knack (TAVR)  CC: 1 month follow up s/p TAVR  History of Present Illness:  Debra Knight is a 71 y.o. female with a history of CAD, HLD, obesity, DJD and severe aortic stenosis s/p TAVR (04/23/17) who presents to clinic for 1 month follow up.   She has followed by Dr. Johnsie Cancel for aortic stenosis. Echocardiogram performed in 2017 revealed moderate aortic stenosis with normal left ventricular systolic function. Follow-up echocardiogram performed 01/08/2017 revealed significant progression of disease with peak velocity across the aortic valve measured 4.5 m/s corresponding to mean transvalvular gradient estimated 43 mmHg and aortic valve area calculated only 0.47 cm. Left ventricular systolic function remained normal with ejection fraction estimated 60-65%. Diagnostic cardiac catheterization was performed by Dr. Burt Knack on 01/15/2017. This confirmed the presence of severe aortic stenosis with peak to peak and mean transvalvular gradients measured 48 and 32 mmHg respectively. The patient was also noted to have severe single-vessel coronary artery disease with 80% stenosis involving the left circumflex coronary artery. The patient was referred for surgical consultation byDr. Cyndia Bent and felt the patient would be at at least moderate risk for conventional surgery because of her age, comorbid medical conditions, limited mobility with physical deconditioning, and particularly because of the presence of a relatively small size aortic root. He felt that aortic root enlargement or root replacement likely be necessary at the time of conventional surgery to avoid the need for the development of patient prosthesis mismatch. She also denied any symptoms of angina.   It was ultimately decided that TAVR would be the best course of action given small aortic  root and at least moderate risk for conventional surgery. Surgery was delayed until the MedtronicCoreValve Evolut Pro was available for use at our facility on 04/23/17.   She underwent successful TAVR with a 54m Medtronic CoreValve Evolut Pro under general anaesthesia via right TF approach on 04/23/17. Postoperative echo showed normal valve placement with no significant PVL. She was started on ASA/plavix.  Today she presents for 1 month follow up. She has been walking up to 10 minutes a day at a brisk pace. Occasionally she feels like she a weight on her chest that lasts seconds. She then feels like she can't a deep breath. It happen had happened at least 4 times. She thinks this could be related to her anxiety. No LE edema, orthopnea or PND. No dizziness or syncope. No blood in stool or urine. No palpations.    Past Medical History:  Diagnosis Date  . Aortic stenosis, severe    a. 04/2017: s/p TAVR wtih a Medtronic Evolut-Pro (size 230m model # Evolutpro29US, serial # [id number] . CAD (coronary artery disease)    a. pre-TAVR cath 01/2017 showed 80% LCX stenosis. No angina. Plan to treat medically  . Carotid artery disease ([place]  . Enlarged thyroid   . GERD (gastroesophageal reflux disease)   . Hyperlipidemia   . OSA (obstructive sleep apnea)    no cpap   . Osteoarthritis of both knees   . Tinnitus     Past Surgical History:  Procedure Laterality Date  . BIOPSY THYROID  2015  . DILATION AND CURETTAGE OF UTERUS    . LEFT HEART CATH AND CORONARY ANGIOGRAPHY N/A 01/15/2017   Procedure: Left Heart Cath and Coronary Angiography;  Surgeon:  [name], MD;  Location: [facility];  Service: Cardiovascular;  Laterality: N/A;  . TEE WITHOUT CARDIOVERSION N/A 04/23/2017   Procedure: TRANSESOPHAGEAL ECHOCARDIOGRAM (TEE);  Surgeon: [name], MD;  Location: [facility];  Service: Open Heart Surgery;  Laterality: N/A;  . TONSILLECTOMY  age 34  . TOTAL KNEE ARTHROPLASTY Right 12/13/2014    Procedure: RIGHT TOTAL KNEE ARTHROPLASTY;  Surgeon: Gaynelle Arabian, MD;  Location: [facility];  Service: Orthopedics;  Laterality: Right;  . TOTAL KNEE ARTHROPLASTY Left 02/20/2016   Procedure: LEFT TOTAL KNEE ARTHROPLASTY;  Surgeon: Gaynelle Arabian, MD;  Location: [facility];  Service: Orthopedics;  Laterality: Left;  . TRANSCATHETER AORTIC VALVE REPLACEMENT, TRANSFEMORAL N/A 04/23/2017   Procedure: TRANSCATHETER AORTIC VALVE REPLACEMENT, TRANSFEMORAL;  Surgeon: [name], MD;  Location: [facility];  Service: Open Heart Surgery;  Laterality: N/A;  MEDTRONIC VALVE    Current Medications: Outpatient Medications Prior to Visit  Medication Sig Dispense Refill  . aspirin EC 81 MG tablet Take 81 mg by mouth every evening.    Marland Kitchen atorvastatin (LIPITOR) 40 MG tablet TAKE ONE TABLET BY MOUTH ONCE DAILY (Patient taking differently: TAKE ONE TABLET BY MOUTH ONCE AT BEDTIME.) 90 tablet 3  . BIOTIN PO Take 1 tablet by mouth daily.    . clopidogrel (PLAVIX) 75 MG tablet Take 1 tablet (75 mg total) by mouth daily with breakfast. 90 tablet 1  . metoprolol tartrate (LOPRESSOR) 25 MG tablet Take 0.5 tablets (12.5 mg total) by mouth 2 (two) times daily. 60 tablet 1  . Multiple Vitamin (MULTIVITAMIN WITH MINERALS) TABS tablet Take 1 tablet by mouth daily.     No facility-administered medications prior to visit.      Allergies:   Advil [ibuprofen]; Penicillins; Demerol [meperidine]; Other; and Cortisone   Social History   Social History  . Marital status: Married    Spouse name: N/A  . Number of children: N/A  . Years of education: N/A   Occupational History  . self employed    Social History Main Topics  . Smoking status: Former Smoker    Packs/day: 2.00    Years: 15.00    Types: Cigarettes    Quit date: 09/03/1968  . Smokeless tobacco: Never Used     Comment: 1-2 ppd. , NONE IN 40 YEARS  . Alcohol use No  . Drug use: No  . Sexual activity: Not Asked   Other Topics Concern  . None   Social History  Narrative  . None     Family History:  The patient's family history includes Diabetes in her maternal grandfather and maternal uncle; Hashimoto's thyroiditis in her daughter and son; Heart attack (age of onset: 81) in her mother; Heart disease in her mother; Prostate cancer in her father.      ROS:   Please see the history of present illness.    ROS All other systems reviewed and are negative.   PHYSICAL EXAM:   VS:  BP (!) 158/80   Pulse 73   Ht _0  (1.549 m)   Wt 245 lb 1.9 oz (111.2 kg)   SpO2 98%   BMI 46.32 kg/m    GEN: Well nourished, well developed, in no acute distress, morbidly obese  HEENT: normal  Neck: no JVD, carotid bruits, or masses Cardiac: very soft SEM. No rubs, or gallops,no edema  Respiratory:  clear to auscultation bilaterally, normal work of breathing GI: soft, nontender, nondistended, + BS MS: no deformity or atrophy  Skin: warm and dry,  no rash Neuro:  Alert and Oriented x 3, Strength and sensation are intact Psych: euthymic mood, full affect   Wt Readings from Last 3 Encounters:  05/23/17 245 lb 1.9 oz (111.2 kg)  05/02/17 240 lb (108.9 kg)  04/25/17 243 lb 6.2 oz (110.4 kg)      Studies/Labs Reviewed:   EKG:  EKG is NOT ordered today.  Recent Labs: 04/18/2017: ALT 21 04/24/2017: BUN 7; Creatinine, Ser 0.71; Hemoglobin 11.1; Magnesium 1.8; Platelets 203; Potassium 3.8; Sodium 140   Lipid Panel    Component Value Date/Time   CHOL 203 (H) 09/15/2015 1537   TRIG 241 (H) 09/15/2015 1537   HDL 42 (L) 09/15/2015 1537   CHOLHDL 4.8 09/15/2015 1537   VLDL 48 (H) 09/15/2015 1537   LDLCALC 113 09/15/2015 1537   LDLDIRECT 177.0 03/16/2015 1452    Additional studies/ records that were reviewed today include:  04/23/17 Procedure:  Transcatheter Aortic Valve Replacement - Percutaneous RightTransfemoral Approach Medtronic Evolut-Pro (size 33m, model # Evolutpro29US, serial # [id number]  Co-Surgeons:Bryan KAlveria Apley MD and MSherren Mocha MD   Anesthesiologist:Terry MOrene Desanctis MD  EDala Dock MD  Pre-operative Echo Findings: ? Severe aortic stenosis and moderate AI ? Normalleft ventricular systolic function  Post-operative Echo Findings: ? trivialparavalvular leak ? Normalleft ventricular systolic function ? Normal prosthetic transvalvular gradient of 876mHg.  _____________  2D ECHO 04/24/17 Study Conclusions - Left ventricle: There was mild concentric hypertrophy. Systolic function was normal. The estimated ejection fraction was in the range of 60% to 65%. The study is not technically sufficient to allow evaluation of LV diastolic function. - Aortic valve: Post TAVR normal appearing Medtronic Resolut 29 mm valve with no signficant peri valvular regurgitation. Valve area (VTI): 1.5 cm^2. Valve area (Vmax): 1.62 cm^2. Valve area (Vmean): 1.49 cm^2. - Mitral valve: Severely calcified annulus. Moderately thickened, moderately calcified leaflets . The findings are consistent with mild stenosis. Valve area by continuity equation (using LVOT flow): 1.22 cm^2. - Left atrium: The atrium was moderately dilated. - Atrial septum: No defect or patent foramen ovale was identified  _____________  2D ECHO 05/23/17   Study Conclusions - Left ventricle: The cavity size was normal. There was moderate   concentric hypertrophy. Systolic function was normal. The   estimated ejection fraction was in the range of 60% to 65%. The   study is not technically sufficient to allow evaluation of LV   diastolic function. - Aortic valve: Post TAVR with 29 mm Resolut Pro valve Stable   gradients and trivial peri valvular regurgitation. Mean gradient   (S): 9 mm Hg. Peak gradient (S): 17 mm Hg. - Mitral valve: Severe MAC with mild functional MS. - Left atrium: The atrium was moderately dilated. - Atrial septum: No defect  or patent foramen ovale was identified.   ASSESSMENT & PLAN:   Severe AS s/p TAVR:  NHYA class 1. 2D ECHO shows good valve positioning and trival PVL. Mean gradient 9 and peak gradient 17. She will stop plavix after 6 months and continue ASA 8131maily. She is aware of the need for SBE prophylaxis for any dental work and certain surgeries. She has a penicillin allergy so we have prescribed clindamycin 600m69m take 1 hours prior to dental work. Plan for repeat echo in 1 year and follow up in valve clinic.  HTN: BP mildly elevated today. 145/80 on my personal recheck. She thinks she has white coat HTN. She would like to follow at home and if it remains  elevated she will call us and we will increase her lopressor from 12.36m BID to 273mBID.   HLD: continue statin   CAD: continue asa, statin and BB  LBBB: we discussed how this is common after TAVR and nothing to worry about   Medication Adjustments/Labs and Tests Ordered: Current medicines are reviewed at length with the patient today.  Concerns regarding medicines are outlined above.  Medication changes, Labs and Tests ordered today are listed in the Patient Instructions below. Patient Instructions  Medication Instructions:  1. CONTINUE CURRENT MEDICATIONS   2. 10/24/17 YOU WILL STOP PLAVIX; CONTINUE ASPIRIN 81 MG DAILY  3. AN RX FOR CLINDAMYCIN 300 MG TABLET WITH THE DIRECTIONS TO TAKE 2 TABS 1 HOUR BEFORE DENTAL PROCEDURES   Labwork: NONE ORDERED TODAY  Testing/Procedures: Your physician has requested that you have an echocardiogram. Echocardiography is a painless test that uses sound waves to create images of your heart. It provides your doctor with information about the size and shape of your heart and how well your heart's chambers and valves are working. This procedure takes approximately one hour. There are no restrictions for this procedure. THIS IS TO BE DONE IN 1 YEAR    Follow-Up:Your physician wants you to follow-up in: [facility] [place] [facility]  You will receive a reminder letter in the mail two months in advance. If you don't receive a letter, please call our office to schedule the follow-up appointment.   Any Other Special Instructions Will Be Listed Below (If Applicable).     If you need a refill on your cardiac medications before your next appointment, please call your pharmacy.      Signed, KaAngelena FormPA-C  05/23/2017 2:55 PM    [facility] [address]  [id number]: [phone]: [phone]

## 2017-05-23 ENCOUNTER — Encounter: Payer: Self-pay | Admitting: Physician Assistant

## 2017-05-23 ENCOUNTER — Ambulatory Visit (HOSPITAL_COMMUNITY): Payer: Medicare Other | Attending: Cardiology

## 2017-05-23 ENCOUNTER — Ambulatory Visit (INDEPENDENT_AMBULATORY_CARE_PROVIDER_SITE_OTHER): Payer: Medicare Other | Admitting: Physician Assistant

## 2017-05-23 ENCOUNTER — Other Ambulatory Visit: Payer: Self-pay

## 2017-05-23 VITALS — BP 158/80 | HR 73 | Ht 61.0 in | Wt 245.1 lb

## 2017-05-23 DIAGNOSIS — Z87891 Personal history of nicotine dependence: Secondary | ICD-10-CM | POA: Diagnosis not present

## 2017-05-23 DIAGNOSIS — I10 Essential (primary) hypertension: Secondary | ICD-10-CM | POA: Insufficient documentation

## 2017-05-23 DIAGNOSIS — I2510 Atherosclerotic heart disease of native coronary artery without angina pectoris: Secondary | ICD-10-CM | POA: Diagnosis not present

## 2017-05-23 DIAGNOSIS — E785 Hyperlipidemia, unspecified: Secondary | ICD-10-CM | POA: Insufficient documentation

## 2017-05-23 DIAGNOSIS — G4733 Obstructive sleep apnea (adult) (pediatric): Secondary | ICD-10-CM | POA: Diagnosis not present

## 2017-05-23 DIAGNOSIS — Z952 Presence of prosthetic heart valve: Secondary | ICD-10-CM | POA: Diagnosis not present

## 2017-05-23 DIAGNOSIS — I358 Other nonrheumatic aortic valve disorders: Secondary | ICD-10-CM | POA: Insufficient documentation

## 2017-05-23 DIAGNOSIS — D649 Anemia, unspecified: Secondary | ICD-10-CM | POA: Diagnosis not present

## 2017-05-23 DIAGNOSIS — I350 Nonrheumatic aortic (valve) stenosis: Secondary | ICD-10-CM | POA: Diagnosis not present

## 2017-05-23 MED ORDER — CLINDAMYCIN HCL 300 MG PO CAPS: 600.0000 mg | ORAL_CAPSULE | Freq: Once | ORAL | 0 refills | Status: AC

## 2017-05-23 NOTE — Patient Instructions (Signed)
Medication Instructions:  1. CONTINUE CURRENT MEDICATIONS   2. 10/24/17 YOU WILL STOP PLAVIX; CONTINUE ASPIRIN 81 MG DAILY  3. AN RX FOR CLINDAMYCIN 300 MG TABLET WITH THE DIRECTIONS TO TAKE 2 TABS 1 HOUR BEFORE DENTAL PROCEDURES   Labwork: NONE ORDERED TODAY  Testing/Procedures: Your physician has requested that you have an echocardiogram. Echocardiography is a painless test that uses sound waves to create images of your heart. It provides your doctor with information about the size and shape of your heart and how well your heart's chambers and valves are working. This procedure takes approximately one hour. There are no restrictions for this procedure. THIS IS TO BE DONE IN 1 YEAR    Follow-Up:Your physician wants you to follow-up in: [place] [facility] ECHO APPT  You will receive a reminder letter in the mail two months in advance. If you don't receive a letter, please call our office to schedule the follow-up appointment.   Any Other Special Instructions Will Be Listed Below (If Applicable).     If you need a refill on your cardiac medications before your next appointment, please call your pharmacy.

## 2017-05-27 ENCOUNTER — Encounter: Payer: Self-pay | Admitting: Thoracic Surgery (Cardiothoracic Vascular Surgery)

## 2017-05-27 ENCOUNTER — Telehealth: Payer: Self-pay | Admitting: Cardiovascular Disease

## 2017-05-27 NOTE — Telephone Encounter (Signed)
I spoke with Debra Knight and she received a faxed order for Cardiac Rehab last Friday.  She requested that I fax the pt's most recent EKG to her so that the pt can begin rehab this Wednesday. EKG faxed.

## 2017-05-27 NOTE — Telephone Encounter (Signed)
Do you know how to place a referral for cardiac rehab to [facility]?

## 2017-05-27 NOTE — Telephone Encounter (Signed)
New message     Needs you to send referral to [facility] for Cardiac rehab  Fax  [phone]

## 2017-06-04 DIAGNOSIS — Z952 Presence of prosthetic heart valve: Secondary | ICD-10-CM | POA: Diagnosis not present

## 2017-06-06 DIAGNOSIS — Z952 Presence of prosthetic heart valve: Secondary | ICD-10-CM | POA: Diagnosis not present

## 2017-06-11 DIAGNOSIS — Z23 Encounter for immunization: Secondary | ICD-10-CM | POA: Diagnosis not present

## 2017-06-11 DIAGNOSIS — Z952 Presence of prosthetic heart valve: Secondary | ICD-10-CM | POA: Diagnosis not present

## 2017-06-14 DIAGNOSIS — Z952 Presence of prosthetic heart valve: Secondary | ICD-10-CM | POA: Diagnosis not present

## 2017-06-18 DIAGNOSIS — Z952 Presence of prosthetic heart valve: Secondary | ICD-10-CM | POA: Diagnosis not present

## 2017-06-23 IMAGING — CR DG CHEST 2V
2 series · 2 of 2 positions shown · non-contrast
Comparison: None.

CLINICAL DATA: Preop for knee arthroplasty

EXAM:
CHEST  2 VIEW

[w chest pa]
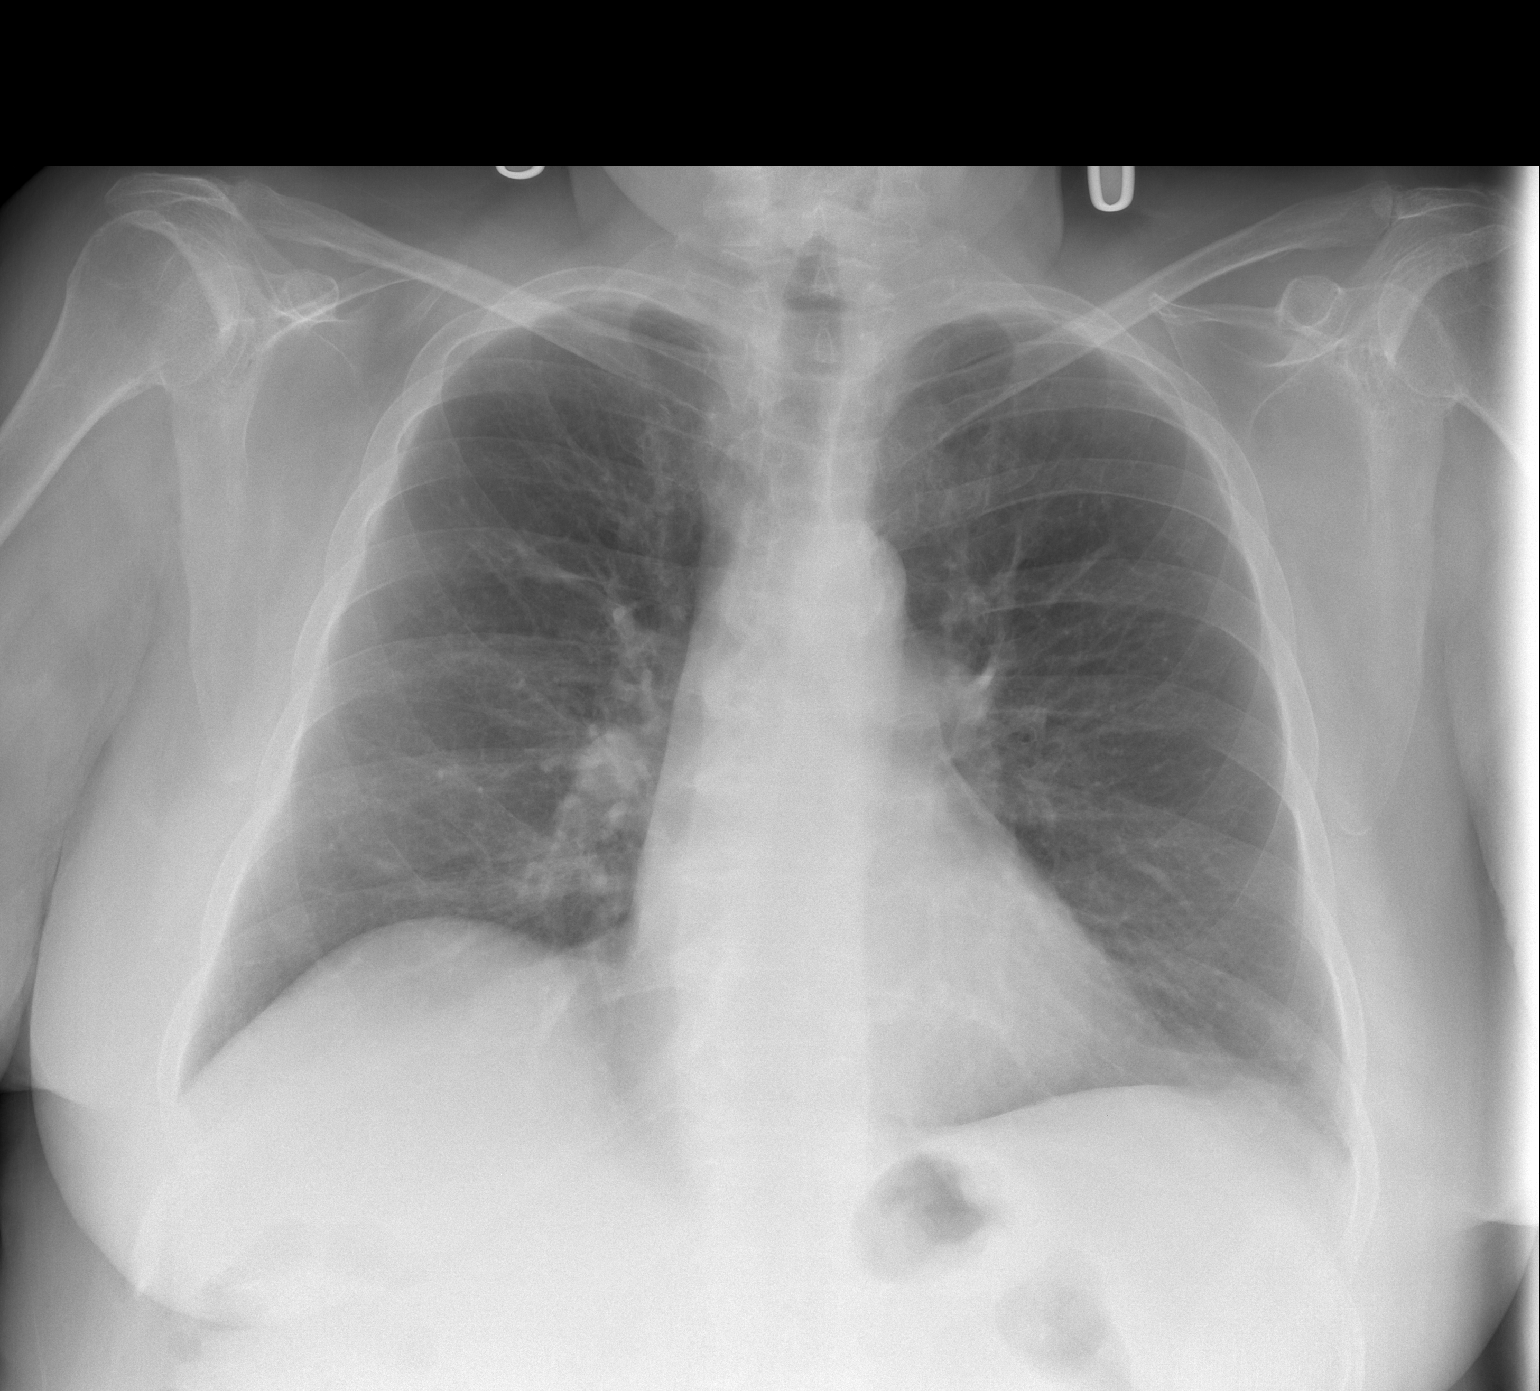

[w chest lat]
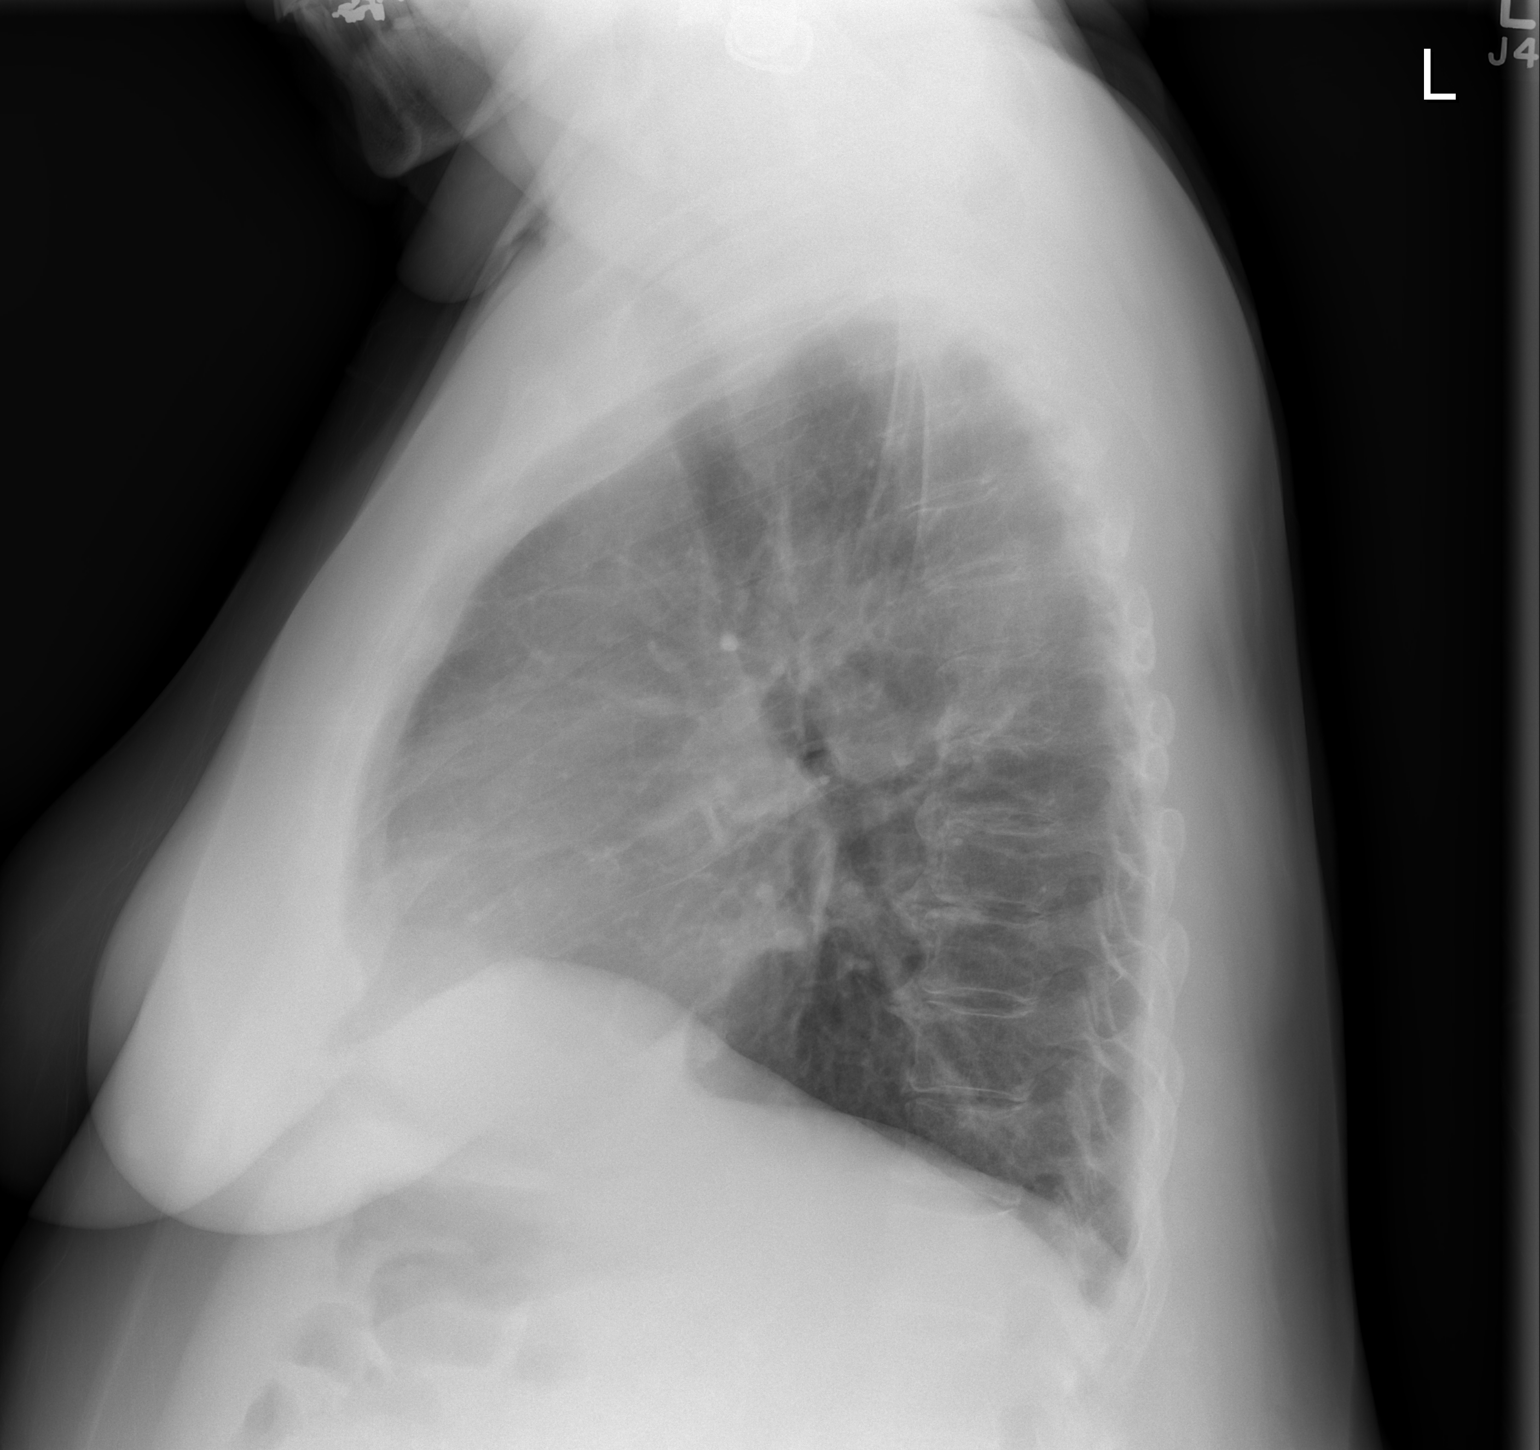

[2 of 2 positions shown; findings below may reference images not displayed]

FINDINGS: Cardiomediastinal silhouette is unremarkable. No acute infiltrate or
pleural effusion. No pulmonary edema. Mild degenerative changes mid
and lower thoracic spine.
IMPRESSION: No active cardiopulmonary disease.

## 2017-06-25 DIAGNOSIS — Z952 Presence of prosthetic heart valve: Secondary | ICD-10-CM | POA: Diagnosis not present

## 2017-06-27 DIAGNOSIS — Z952 Presence of prosthetic heart valve: Secondary | ICD-10-CM | POA: Diagnosis not present

## 2017-07-02 DIAGNOSIS — Z952 Presence of prosthetic heart valve: Secondary | ICD-10-CM | POA: Diagnosis not present

## 2017-07-04 DIAGNOSIS — Z952 Presence of prosthetic heart valve: Secondary | ICD-10-CM | POA: Diagnosis not present

## 2017-07-04 DIAGNOSIS — Z48812 Encounter for surgical aftercare following surgery on the circulatory system: Secondary | ICD-10-CM | POA: Diagnosis not present

## 2017-07-09 DIAGNOSIS — Z48812 Encounter for surgical aftercare following surgery on the circulatory system: Secondary | ICD-10-CM | POA: Diagnosis not present

## 2017-07-09 DIAGNOSIS — Z952 Presence of prosthetic heart valve: Secondary | ICD-10-CM | POA: Diagnosis not present

## 2017-07-10 NOTE — Progress Notes (Signed)
Cardiology Office Note    Date:  07/12/2017   ID:  Debra Knight, DOB [birth year], MRN [id number]  PCP:  Patient, No Pcp Per  Cardiologist: Dr. Johnsie Cancel / Dr. Burt Knack (TAVR)  CC: 1 month follow up s/p TAVR  History of Present Illness:  Debra Knight is a 71 y.o. female with a history of CAD, HLD, obesity, DJD and severe aortic stenosis s/p TAVR (04/23/17) who presents to clinic for 1 month follow up.   Followed long term for AS  Echocardiogram performed 01/08/2017 revealed significant progression of disease with peak velocity across the aortic valve measured 4.5 m/s corresponding to mean transvalvular gradient estimated 43 mmHg and aortic valve area calculated only 0.47 cm. Left ventricular systolic function remained normal with ejection fraction estimated 60-65%. Diagnostic cardiac catheterization was performed by Dr. Burt Knack on 01/15/2017. This confirmed the presence of severe aortic stenosis with peak to peak and mean transvalvular gradients measured 48 and 32 mmHg respectively. The patient was also noted to have severe single-vessel coronary artery disease with 80% stenosis involving the left circumflex coronary artery. The patient was referred for surgical consultation byDr. Cyndia Bent and felt the patient would be at at least moderate risk for conventional surgery because of her age, comorbid medical conditions, limited mobility with physical deconditioning, and particularly because of the presence of a relatively small size aortic root. He felt that aortic root enlargement or root replacement likely be necessary at the time of conventional surgery to avoid the need for the development of patient prosthesis mismatch. She also denied any symptoms of angina.    She underwent successful TAVR with a 35m Medtronic CoreValve Evolut Pro under general anaesthesia via right TF approach on 04/23/17. Postoperative echo showed normal valve placement with no significant PVL. She was started on  ASA/plavix.  She denies chest pain but activity still limited by her bilateral knee replacements and obesity Discussed concern for circumflex lesion and will order myovue and have her f/u with Dr CBurt KnackTo discuss possible need for elective PCI/Stent  Past Medical History:  Diagnosis Date  . Aortic stenosis, severe    a. 04/2017: s/p TAVR wtih a Medtronic Evolut-Pro (size 250m model # Evolutpro29US, serial # [id number] . CAD (coronary artery disease)    a. pre-TAVR cath 01/2017 showed 80% LCX stenosis. No angina. Plan to treat medically  . Carotid artery disease ([place]  . Enlarged thyroid   . GERD (gastroesophageal reflux disease)   . Hyperlipidemia   . OSA (obstructive sleep apnea)    no cpap   . Osteoarthritis of both knees   . Tinnitus     Past Surgical History:  Procedure Laterality Date  . BIOPSY THYROID  2015  . DILATION AND CURETTAGE OF UTERUS    . TONSILLECTOMY  age [age]  Current Medications: Outpatient Medications Prior to Visit  Medication Sig Dispense Refill  . aspirin EC 81 MG tablet Take 81 mg by mouth every evening.    . Marland Kitchentorvastatin (LIPITOR) 40 MG tablet TAKE ONE TABLET BY MOUTH ONCE DAILY (Patient taking differently: TAKE ONE TABLET BY MOUTH ONCE AT BEDTIME.) 90 tablet 3  . BIOTIN PO Take 1 tablet by mouth daily.    . clopidogrel (PLAVIX) 75 MG tablet Take 1 tablet (75 mg total) by mouth daily with breakfast. 90 tablet 1  . metoprolol tartrate (LOPRESSOR) 25 MG tablet Take 0.5 tablets (12.5 mg total) by mouth 2 (two) times daily. 60 tablet 1  . Multiple Vitamin (  MULTIVITAMIN WITH MINERALS) TABS tablet Take 1 tablet by mouth daily.     No facility-administered medications prior to visit.      Allergies:   Advil [ibuprofen]; Penicillins; Demerol [meperidine]; Other; and Cortisone   Social History   Socioeconomic History  . Marital status: Married    Spouse name: None  . Number of children: None  . Years of education: None  . Highest education level:  None  Social Needs  . Financial resource strain: None  . Food insecurity - worry: None  . Food insecurity - inability: None  . Transportation needs - medical: None  . Transportation needs - non-medical: None  Occupational History  . Occupation: self employed  Tobacco Use  . Smoking status: Former Smoker    Packs/day: 2.00    Years: 15.00    Pack years: 30.00    Types: Cigarettes    Last attempt to quit: 09/03/1968    Years since quitting: 48.8  . Smokeless tobacco: Never Used  . Tobacco comment: 1-2 ppd. , NONE IN 40 YEARS  Substance and Sexual Activity  . Alcohol use: No  . Drug use: No  . Sexual activity: None  Other Topics Concern  . None  Social History Narrative  . None     Family History:  The patient's family history includes Diabetes in her maternal grandfather and maternal uncle; Hashimoto's thyroiditis in her daughter and son; Heart attack (age of onset: 73) in her mother; Heart disease in her mother; Prostate cancer in her father.      ROS:   Please see the history of present illness.    ROS All other systems reviewed and are negative.   PHYSICAL EXAM:   VS:  BP 128/78   Pulse 75   Ht _0  (1.549 m)   Wt 242 lb 8 oz (110 kg)   SpO2 98%   BMI 45.82 kg/m    Affect appropriate Obese white female  HEENT: normal Neck supple with no adenopathy JVP normal no bruits no thyromegaly Lungs clear with no wheezing and good diaphragmatic motion Heart:  S1/S2 SEM  murmur, no rub, gallop or click PMI normal Abdomen: benighn, BS positve, no tenderness, no AAA no bruit.  No HSM or HJR Distal pulses intact with no bruits No edema Neuro non-focal Skin warm and dry Post bilateral TKR     Wt Readings from Last 3 Encounters:  07/12/17 242 lb 8 oz (110 kg)  05/23/17 245 lb 1.9 oz (111.2 kg)  05/02/17 240 lb (108.9 kg)      Studies/Labs Reviewed:   EKG:  EKG is NOT ordered today.  Recent Labs: 04/18/2017: ALT 21 04/24/2017: BUN 7; Creatinine, Ser 0.71;  Hemoglobin 11.1; Magnesium 1.8; Platelets 203; Potassium 3.8; Sodium 140   Lipid Panel    Component Value Date/Time   CHOL 203 (H) 09/15/2015 1537   TRIG 241 (H) 09/15/2015 1537   HDL 42 (L) 09/15/2015 1537   CHOLHDL 4.8 09/15/2015 1537   VLDL 48 (H) 09/15/2015 1537   LDLCALC 113 09/15/2015 1537   LDLDIRECT 177.0 03/16/2015 1452    Additional studies/ records that were reviewed today include:  04/23/17 Procedure:  Transcatheter Aortic Valve Replacement - Percutaneous RightTransfemoral Approach Medtronic Evolut-Pro (size 61m, model # Evolutpro29US, serial # [id number]  Co-Surgeons:Bryan KAlveria Apley MD and MSherren Mocha MD   Anesthesiologist:Terry MOrene Desanctis MD  EDala Dock MD  Pre-operative Echo Findings: ? Severe aortic stenosis and moderate AI ? Normalleft ventricular systolic function  Post-operative Echo  Findings: ? trivialparavalvular leak ? Normalleft ventricular systolic function ? Normal prosthetic transvalvular gradient of 25m Hg.  _____________  2D ECHO 04/24/17 Study Conclusions - Left ventricle: There was mild concentric hypertrophy. Systolic function was normal. The estimated ejection fraction was in the range of 60% to 65%. The study is not technically sufficient to allow evaluation of LV diastolic function. - Aortic valve: Post TAVR normal appearing Medtronic Resolut 29 mm valve with no signficant peri valvular regurgitation. Valve area (VTI): 1.5 cm^2. Valve area (Vmax): 1.62 cm^2. Valve area (Vmean): 1.49 cm^2. - Mitral valve: Severely calcified annulus. Moderately thickened, moderately calcified leaflets . The findings are consistent with mild stenosis. Valve area by continuity equation (using LVOT flow): 1.22 cm^2. - Left atrium: The atrium was moderately dilated. - Atrial septum: No defect or patent foramen ovale was  identified  _____________  2D ECHO 05/23/17   Study Conclusions - Left ventricle: The cavity size was normal. There was moderate   concentric hypertrophy. Systolic function was normal. The   estimated ejection fraction was in the range of 60% to 65%. The   study is not technically sufficient to allow evaluation of LV   diastolic function. - Aortic valve: Post TAVR with 29 mm Resolut Pro valve Stable   gradients and trivial peri valvular regurgitation. Mean gradient   (S): 9 mm Hg. Peak gradient (S): 17 mm Hg. - Mitral valve: Severe MAC with mild functional MS. - Left atrium: The atrium was moderately dilated. - Atrial septum: No defect or patent foramen ovale was identified.   ASSESSMENT & PLAN:   Severe AS s/p TAVR:  NHYA class 1. 2D ECHO shows good valve positioning and trival PVL. Mean gradient 9 and peak gradient 17. She will stop plavix after 6 months and continue ASA 864mdaily. She is aware of the need for SBE prophylaxis for any dental work and certain surgeries. She has a penicillin allergy so we have prescribed clindamycin 60071mo take 1 hours prior to dental work. Plan for repeat echo in 1 year and follow up in valve clinic.  HTN: We will increase her lopressor from 12.5mg70mD to 25mg31m.   HLD: continue statin   CAD: continue asa, statin and BB circumflex lesion is very tight will order lexiscan myovue And have her f/u with Dr CoopeBurt Knackiscuss management   LBBB: we discussed how this is common after TAVR and nothing to worry about   PeterJenkins [place]

## 2017-07-12 ENCOUNTER — Ambulatory Visit (INDEPENDENT_AMBULATORY_CARE_PROVIDER_SITE_OTHER): Payer: Medicare Other | Admitting: Cardiovascular Disease

## 2017-07-12 ENCOUNTER — Encounter: Payer: Self-pay | Admitting: Cardiovascular Disease

## 2017-07-12 VITALS — BP 128/78 | HR 75 | Ht 61.0 in | Wt 242.5 lb

## 2017-07-12 DIAGNOSIS — I10 Essential (primary) hypertension: Secondary | ICD-10-CM

## 2017-07-12 DIAGNOSIS — E785 Hyperlipidemia, unspecified: Secondary | ICD-10-CM | POA: Diagnosis not present

## 2017-07-12 DIAGNOSIS — I2510 Atherosclerotic heart disease of native coronary artery without angina pectoris: Secondary | ICD-10-CM | POA: Diagnosis not present

## 2017-07-12 DIAGNOSIS — Z952 Presence of prosthetic heart valve: Secondary | ICD-10-CM

## 2017-07-12 NOTE — Patient Instructions (Addendum)
Medication Instructions:  Your physician recommends that you continue on your current medications as directed. Please refer to the Current Medication list given to you today.  Labwork: NONE  Testing/Procedures: Your physician has requested that you have a lexiscan myoview in 2 to 3 weeks. For further information please visit [email or web address]. Please follow instruction sheet, as given.  Follow-Up: Your physician recommends that you schedule a follow-up appointment in: 4 to 5 weeks with Dr. Burt Knack after stress test.  Your physician wants you to follow-up in: 6 months with Dr. Johnsie Cancel. You will receive a reminder letter in the mail two months in advance. If you don't receive a letter, please call our office to schedule the follow-up appointment.   If you need a refill on your cardiac medications before your next appointment, please call your pharmacy.

## 2017-07-29 ENCOUNTER — Telehealth (HOSPITAL_COMMUNITY): Payer: Self-pay | Admitting: *Deleted

## 2017-07-29 NOTE — Telephone Encounter (Signed)
Attempted to call patient regarding upcoming appointment - busy x 2.   Debra Knight  

## 2017-07-31 ENCOUNTER — Ambulatory Visit (HOSPITAL_COMMUNITY): Payer: Medicare Other | Attending: Cardiology

## 2017-07-31 DIAGNOSIS — I2510 Atherosclerotic heart disease of native coronary artery without angina pectoris: Secondary | ICD-10-CM | POA: Diagnosis not present

## 2017-07-31 MED ORDER — REGADENOSON 0.4 MG/5ML IV SOLN
0.4000 mg | Freq: Once | INTRAVENOUS | Status: AC
  Administered 2017-07-31: 0.4 mg via INTRAVENOUS

## 2017-07-31 MED ORDER — TECHNETIUM TC 99M TETROFOSMIN IV KIT
32.4000 | PACK | Freq: Once | INTRAVENOUS | Status: AC | PRN
  Administered 2017-07-31: 32.4 via INTRAVENOUS
  Filled 2017-07-31: qty 33

## 2017-08-01 ENCOUNTER — Ambulatory Visit (HOSPITAL_COMMUNITY): Payer: Medicare Other | Attending: Internal Medicine

## 2017-08-01 LAB — MYOCARDIAL PERFUSION IMAGING
CHL CUP NUCLEAR SDS: 9
CHL CUP NUCLEAR SRS: 6
CHL CUP NUCLEAR SSS: 15
CSEPPHR: 68 {beats}/min
LV dias vol: 75 mL (ref 46–106)
LVSYSVOL: 17 mL
RATE: 0.35
Rest HR: 53 {beats}/min
TID: 0.99

## 2017-08-01 MED ORDER — TECHNETIUM TC 99M TETROFOSMIN IV KIT
32.5000 | PACK | Freq: Once | INTRAVENOUS | Status: AC | PRN
  Administered 2017-08-01: 32.5 via INTRAVENOUS
  Filled 2017-08-01: qty 33

## 2017-08-05 ENCOUNTER — Telehealth: Payer: Self-pay | Admitting: Cardiovascular Disease

## 2017-08-05 NOTE — Telephone Encounter (Signed)
Patient aware of myoview results.

## 2017-08-05 NOTE — Telephone Encounter (Signed)
Follow Up:     Returning your call from [place] results.

## 2017-08-16 ENCOUNTER — Telehealth: Payer: Self-pay | Admitting: Cardiovascular Disease

## 2017-08-16 MED ORDER — METOPROLOL TARTRATE 25 MG PO TABS: 25.0000 mg | ORAL_TABLET | Freq: Two times a day (BID) | ORAL | 3 refills | Status: DC

## 2017-08-16 NOTE — Telephone Encounter (Signed)
Called patient about her lopressor. Asked patient how she is taking her lopressor. Patient stated she is taking 12.5 mg BID, and her husband is taking 25 mg BID. Will clarify with Dr. Johnsie Cancel to see if he wants patient to increase her Lopressor to 25 mg BID. Patient stated her BP has been fine, and she has no issues with the medication. Will forward to Dr. Johnsie Cancel.

## 2017-08-16 NOTE — Telephone Encounter (Signed)
New Message  .Pt c/o medication issue:  1. Name of Medication: Lopressor   2. How are you currently taking this medication (dosage and times per day)? 25mg   3. Are you having a reaction (difficulty breathing--STAT)? no  4. What is your medication issue? Pt would like to know if she needs to continue taking this medication, if so she will need a new prescription. Please call back to discuss

## 2017-08-16 NOTE — Telephone Encounter (Signed)
Have her take lopressor 25 bid she has CAD

## 2017-08-16 NOTE — Telephone Encounter (Signed)
Left detailed message for patient to start take Lopressor 25 mg by mouth BID. Sent to patient's pharmacy of choice.

## 2017-08-16 NOTE — Telephone Encounter (Signed)
This should be addressed by primary cardiologist Dr Johnsie Cancel.  Per Dr Kyla Balzarine last note 07/12/2017 he increased the pt's lopressor.  Per office note: HTN: We will increase her lopressor from 12.5mg  BID to 25mg  BID.   I will forward this message to Dr Kyla Balzarine nurse.

## 2017-08-19 ENCOUNTER — Ambulatory Visit: Payer: Self-pay | Admitting: Cardiovascular Disease

## 2017-09-20 ENCOUNTER — Other Ambulatory Visit: Payer: Self-pay | Admitting: Cardiovascular Disease

## 2017-09-20 MED ORDER — ATORVASTATIN CALCIUM 40 MG PO TABS: 40.0000 mg | ORAL_TABLET | Freq: Every day | ORAL | 3 refills | Status: DC

## 2017-09-20 NOTE — Telephone Encounter (Signed)
Pt's medication was sent to pt's pharmacy as requested. Confirmation received.  °

## 2018-01-03 ENCOUNTER — Telehealth: Payer: Self-pay | Admitting: Cardiovascular Disease

## 2018-01-03 NOTE — Telephone Encounter (Signed)
New message  Pt verbalzied that she is calling for RN  Pt is very upset because availability May is not open at this time, I apologized many times.  Offered her an available appointment in August or the option of a PA and also to go on Dr.Nishans Wait list   Pt declined all options because she stated that she received a letter that states that Dr.Nishan want to see her in [place], 2019.

## 2018-01-03 NOTE — Telephone Encounter (Signed)
Called patient back and made patient an appointment in June.

## 2018-02-04 ENCOUNTER — Encounter: Payer: Self-pay | Admitting: Cardiovascular Disease

## 2018-02-04 ENCOUNTER — Ambulatory Visit (INDEPENDENT_AMBULATORY_CARE_PROVIDER_SITE_OTHER): Payer: Medicare Other | Admitting: Cardiovascular Disease

## 2018-02-04 VITALS — BP 122/60 | HR 54 | Ht 61.0 in

## 2018-02-04 DIAGNOSIS — I10 Essential (primary) hypertension: Secondary | ICD-10-CM

## 2018-02-04 DIAGNOSIS — I2510 Atherosclerotic heart disease of native coronary artery without angina pectoris: Secondary | ICD-10-CM

## 2018-02-04 DIAGNOSIS — E785 Hyperlipidemia, unspecified: Secondary | ICD-10-CM

## 2018-02-04 DIAGNOSIS — Z952 Presence of prosthetic heart valve: Secondary | ICD-10-CM | POA: Diagnosis not present

## 2018-02-04 NOTE — Progress Notes (Deleted)
Cardiology Office Note    Date:  02/04/2018   ID:  Debra Knight, DOB Mar 17, 1946, MRN [id number]  PCP:  Patient, No Pcp Per  Cardiologist: Dr. Johnsie Cancel / Dr. Burt Knack (TAVR)  CC: 1 month follow up s/p TAVR  History of Present Illness:  Debra Knight is a 72 y.o. female with a history of CAD, HLD, obesity, DJD and severe aortic stenosis s/p TAVR (04/23/17) who presents to clinic for 1 month follow up.   Followed long term for AS  Echocardiogram performed 01/08/2017 revealed significant progression of disease with peak velocity across the aortic valve measured 4.5 m/s corresponding to mean transvalvular gradient estimated 43 mmHg and aortic valve area calculated only 0.47 cm. Left ventricular systolic function remained normal with ejection fraction estimated 60-65%. Diagnostic cardiac catheterization was performed by Dr. Burt Knack on 01/15/2017. This confirmed the presence of severe aortic stenosis with peak to peak and mean transvalvular gradients measured 48 and 32 mmHg respectively. The patient was also noted to have severe single-vessel coronary artery disease with 80% stenosis involving the left circumflex coronary artery. The patient was referred for surgical consultation byDr. Cyndia Bent and felt the patient would be at at least moderate risk for conventional surgery because of her age, comorbid medical conditions, limited mobility with physical deconditioning, and particularly because of the presence of a relatively small size aortic root. He felt that aortic root enlargement or root replacement likely be necessary at the time of conventional surgery to avoid the need for the development of patient prosthesis mismatch. She also denied any symptoms of angina.    She underwent successful TAVR with a 90m Medtronic CoreValve Evolut Pro under general anaesthesia via right TF approach on 04/23/17. Postoperative echo showed normal valve placement with no significant PVL. She was started on  ASA/plavix.  She denies chest pain but activity still limited by her bilateral knee replacements and obesity Discussed concern for circumflex lesion and will order myovue and have her f/u with Dr CBurt KnackTo discuss possible need for elective PCI/Stent  Past Medical History:  Diagnosis Date  . Aortic stenosis, severe    a. 04/2017: s/p TAVR wtih a Medtronic Evolut-Pro (size 271m model # Evolutpro29US, serial # [id number] . CAD (coronary artery disease)    a. pre-TAVR cath 01/2017 showed 80% LCX stenosis. No angina. Plan to treat medically  . Carotid artery disease ([facility]  . Enlarged thyroid   . GERD (gastroesophageal reflux disease)   . Hyperlipidemia   . OSA (obstructive sleep apnea)    no cpap   . Osteoarthritis of both knees   . Tinnitus     Past Surgical History:  Procedure Laterality Date  . BIOPSY THYROID  2015  . DILATION AND CURETTAGE OF UTERUS    . LEFT HEART CATH AND CORONARY ANGIOGRAPHY N/A 01/15/2017   Procedure: Left Heart Cath and Coronary Angiography;  Surgeon: CoSherren MochaMD;  [facility];  Service: Cardiovascular;  Laterality: N/A;  . TEE WITHOUT CARDIOVERSION N/A 04/23/2017   Procedure: TRANSESOPHAGEAL ECHOCARDIOGRAM (TEE);  Surgeon: CoSherren MochaMD;  [facility] Service: Open Heart Surgery;  Laterality: N/A;  . TONSILLECTOMY  age [age]. TOTAL KNEE ARTHROPLASTY Right 12/13/2014   Procedure: RIGHT TOTAL KNEE ARTHROPLASTY;  Surgeon: FrGaynelle ArabianMD;  Location: [facility];  Service: Orthopedics;  Laterality: Right;  . TOTAL KNEE ARTHROPLASTY Left 02/20/2016   Procedure: LEFT TOTAL KNEE ARTHROPLASTY;  Surgeon: FrGaynelle ArabianMD;  Location: [facility];  Service: Orthopedics;  Laterality: Left;  . TRANSCATHETER AORTIC VALVE REPLACEMENT, TRANSFEMORAL N/A 04/23/2017   Procedure: TRANSCATHETER AORTIC VALVE REPLACEMENT, TRANSFEMORAL;  Surgeon: [name], MD;  Location: [facility];  Service: Open Heart Surgery;  Laterality: N/A;  MEDTRONIC VALVE     Current Medications: Outpatient Medications Prior to Visit  Medication Sig Dispense Refill  . aspirin EC 81 MG tablet Take 81 mg by mouth every evening.    Marland Kitchen atorvastatin (LIPITOR) 40 MG tablet Take 1 tablet (40 mg total) by mouth daily. 90 tablet 3  . BIOTIN PO Take 1 tablet by mouth daily.    . clopidogrel (PLAVIX) 75 MG tablet Take 1 tablet (75 mg total) by mouth daily with breakfast. 90 tablet 1  . metoprolol tartrate (LOPRESSOR) 25 MG tablet Take 1 tablet (25 mg total) by mouth 2 (two) times daily. 180 tablet 3  . Multiple Vitamin (MULTIVITAMIN WITH MINERALS) TABS tablet Take 1 tablet by mouth daily.     No facility-administered medications prior to visit.      Allergies:   Advil [ibuprofen]; Penicillins; Demerol [meperidine]; Other; and Cortisone   Social History   Socioeconomic History  . Marital status: Married    Spouse name: Not on file  . Number of children: Not on file  . Years of education: Not on file  . Highest education level: Not on file  Occupational History  . Occupation: self employed  Social Needs  . Financial resource strain: Not on file  . Food insecurity:    Worry: Not on file    Inability: Not on file  . Transportation needs:    Medical: Not on file    Non-medical: Not on file  Tobacco Use  . Smoking status: Former Smoker    Packs/day: 2.00    Years: 15.00    Pack years: 30.00    Types: Cigarettes    Last attempt to quit: 09/03/1968    Years since quitting: 49.4  . Smokeless tobacco: Never Used  . Tobacco comment: 1-2 ppd. , NONE IN 40 YEARS  Substance and Sexual Activity  . Alcohol use: No  . Drug use: No  . Sexual activity: Not on file  Lifestyle  . Physical activity:    Days per week: Not on file    Minutes per session: Not on file  . Stress: Not on file  Relationships  . Social connections:    Talks on phone: Not on file    Gets together: Not on file    Attends religious service: Not on file    Active member of club or  organization: Not on file    Attends meetings of clubs or organizations: Not on file    Relationship status: Not on file  Other Topics Concern  . Not on file  Social History Narrative  . Not on file     Family History:  The patient's family history includes Diabetes in her maternal grandfather and maternal uncle; Hashimoto's thyroiditis in her daughter and son; Heart attack (age of onset: 60) in her mother; Heart disease in her mother; Prostate cancer in her father.      ROS:   Please see the history of present illness.    ROS All other systems reviewed and are negative.   PHYSICAL EXAM:   VS:  There were no vitals taken for this visit.   Affect appropriate Obese white female  HEENT: normal Neck supple with no adenopathy JVP normal no bruits no thyromegaly Lungs clear with no  wheezing and good diaphragmatic motion Heart:  S1/S2 SEM  murmur, no rub, gallop or click PMI normal Abdomen: benighn, BS positve, no tenderness, no AAA no bruit.  No HSM or HJR Distal pulses intact with no bruits No edema Neuro non-focal Skin warm and dry Post bilateral TKR     Wt Readings from Last 3 Encounters:  07/12/17 242 lb 8 oz (110 kg)  05/23/17 245 lb 1.9 oz (111.2 kg)  05/02/17 240 lb (108.9 kg)      Studies/Labs Reviewed:   EKG:  EKG is NOT ordered today.  Recent Labs: 04/18/2017: ALT 21 04/24/2017: BUN 7; Creatinine, Ser 0.71; Hemoglobin 11.1; Magnesium 1.8; Platelets 203; Potassium 3.8; Sodium 140   Lipid Panel    Component Value Date/Time   CHOL 203 (H) 09/15/2015 1537   TRIG 241 (H) 09/15/2015 1537   HDL 42 (L) 09/15/2015 1537   CHOLHDL 4.8 09/15/2015 1537   VLDL 48 (H) 09/15/2015 1537   LDLCALC 113 09/15/2015 1537   LDLDIRECT 177.0 03/16/2015 1452    Additional studies/ records that were reviewed today include:  04/23/17 Procedure:  Transcatheter Aortic Valve Replacement - Percutaneous RightTransfemoral Approach Medtronic Evolut-Pro (size  29m, model # Evolutpro29US, serial # [id number]  Co-Surgeons:Bryan KAlveria Apley MD and MSherren Mocha MD   Anesthesiologist:Terry MOrene Desanctis MD  EDala Dock MD  Pre-operative Echo Findings: ? Severe aortic stenosis and moderate AI ? Normalleft ventricular systolic function  Post-operative Echo Findings: ? trivialparavalvular leak ? Normalleft ventricular systolic function ? Normal prosthetic transvalvular gradient of 864mHg.  _____________  2D ECHO 04/24/17 Study Conclusions - Left ventricle: There was mild concentric hypertrophy. Systolic function was normal. The estimated ejection fraction was in the range of 60% to 65%. The study is not technically sufficient to allow evaluation of LV diastolic function. - Aortic valve: Post TAVR normal appearing Medtronic Resolut 29 mm valve with no signficant peri valvular regurgitation. Valve area (VTI): 1.5 cm^2. Valve area (Vmax): 1.62 cm^2. Valve area (Vmean): 1.49 cm^2. - Mitral valve: Severely calcified annulus. Moderately thickened, moderately calcified leaflets . The findings are consistent with mild stenosis. Valve area by continuity equation (using LVOT flow): 1.22 cm^2. - Left atrium: The atrium was moderately dilated. - Atrial septum: No defect or patent foramen ovale was identified  _____________  2D ECHO 05/23/17   Study Conclusions - Left ventricle: The cavity size was normal. There was moderate   concentric hypertrophy. Systolic function was normal. The   estimated ejection fraction was in the range of 60% to 65%. The   study is not technically sufficient to allow evaluation of LV   diastolic function. - Aortic valve: Post TAVR with 29 mm Resolut Pro valve Stable   gradients and trivial peri valvular regurgitation. Mean gradient   (S): 9 mm Hg. Peak gradient (S): 17 mm Hg. - Mitral valve: Severe MAC with mild functional  MS. - Left atrium: The atrium was moderately dilated. - Atrial septum: No defect or patent foramen ovale was identified.   ASSESSMENT & PLAN:   Severe AS s/p TAVR:  NHYA class 1. 2D ECHO shows good valve positioning and trival PVL. Mean gradient 9 and peak gradient 17. She will stop plavix after 6 months and continue ASA 8160maily. She is aware of the need for SBE prophylaxis for any dental work and certain surgeries. She has a penicillin allergy so we have prescribed clindamycin 600m62m take 1 hours prior to dental work. Plan for repeat echo in 1 year and follow  up in valve clinic.  HTN: We will increase her lopressor from 12.24m BID to 237mBID.   HLD: continue statin   CAD: continue asa, statin and BB circumflex lesion is very tight will order lexiscan myovue And have her f/u with Dr CoBurt Knacko discuss management   LBBB: we discussed how this is common after TAVR and nothing to worry about   PeJenkins Rouge

## 2018-02-04 NOTE — Patient Instructions (Signed)

## 2018-02-04 NOTE — Progress Notes (Signed)
Cardiology Office Note    Date:  02/04/2018   ID:  Debra Knight, DOB [birth year], MRN [id number]  PCP:  Patient, No Pcp Per  Cardiologist: Dr. Johnsie Cancel / Dr. Burt Knack (TAVR)  CC: TAVR/ CAD   History of Present Illness:  Debra Knight is a 72 y.o. female with a history of CAD, HLD, obesity, DJD and severe aortic stenosis s/p TAVR (04/23/17) who presents to clinic for 1 month follow up.   Followed long term for AS  Echocardiogram performed 01/08/2017 revealed significant progression of disease with peak velocity across the aortic valve measured 4.5 m/s corresponding to mean transvalvular gradient estimated 43 mmHg and aortic valve area calculated only 0.47 cm. Left ventricular systolic function remained normal with ejection fraction estimated 60-65%. Diagnostic cardiac catheterization was performed by Dr. Burt Knack on 01/15/2017. This confirmed the presence of severe aortic stenosis with peak to peak and mean transvalvular gradients measured 48 and 32 mmHg respectively. The patient was also noted to have severe single-vessel coronary artery disease with 80% stenosis involving the left circumflex coronary artery. The patient was referred for surgical consultation byDr. Cyndia Bent and felt the patient would be at at least moderate risk for conventional surgery because of her age, comorbid medical conditions, limited mobility with physical deconditioning, and particularly because of the presence of a relatively small size aortic root. He felt that aortic root enlargement or root replacement likely be necessary at the time of conventional surgery to avoid the need for the development of patient prosthesis mismatch. She also denied any symptoms of angina.    She underwent successful TAVR with a 36m Medtronic CoreValve Evolut Pro under general anaesthesia via right TF approach on 04/23/17. Postoperative echo showed normal valve placement with no significant PVL. She was started on ASA/plavix.  Discussed  concern for circumflex lesion She had myovue in November 2018 which Was non ischemic and she denies any chest pain   Past Medical History:  Diagnosis Date  . Aortic stenosis, severe    a. 04/2017: s/p TAVR wtih a Medtronic Evolut-Pro (size 265m model # Evolutpro29US, serial # [id number] . CAD (coronary artery disease)    a. pre-TAVR cath 01/2017 showed 80% LCX stenosis. No angina. Plan to treat medically  . Carotid artery disease ([facility]  . Enlarged thyroid   . GERD (gastroesophageal reflux disease)   . Hyperlipidemia   . OSA (obstructive sleep apnea)    no cpap   . Osteoarthritis of both knees   . Tinnitus     Past Surgical History:  Procedure Laterality Date  . BIOPSY THYROID  2015  . DILATION AND CURETTAGE OF UTERUS    . LEFT HEART CATH AND CORONARY ANGIOGRAPHY N/A 01/15/2017   Procedure: Left Heart Cath and Coronary Angiography;  Surgeon: CoSherren MochaMD;  [facility];  Service: Cardiovascular;  Laterality: N/A;  . TEE WITHOUT CARDIOVERSION N/A 04/23/2017   Procedure: TRANSESOPHAGEAL ECHOCARDIOGRAM (TEE);  Surgeon: CoSherren MochaMD;  [facility] Service: Open [facility] Surgery;  Laterality: N/A;  . TONSILLECTOMY  age [age]. TOTAL KNEE ARTHROPLASTY Right 12/13/2014   Procedure: RIGHT TOTAL KNEE ARTHROPLASTY;  Surgeon: FrGaynelle ArabianMD;  [facility];  Service: Orthopedics;  Laterality: Right;  . TOTAL KNEE ARTHROPLASTY Left 02/20/2016   Procedure: LEFT TOTAL KNEE ARTHROPLASTY;  Surgeon: FrGaynelle ArabianMD;  [facility]: [facility];  Service: Orthopedics;  Laterality: Left;  . TRANSCATHETER AORTIC VALVE REPLACEMENT, TRANSFEMORAL N/A 04/23/2017   Procedure: TRANSCATHETER AORTIC  VALVE REPLACEMENT, TRANSFEMORAL;  Surgeon: [name], MD;  Location: [place];  Service: Open Heart Surgery;  Laterality: N/A;  MEDTRONIC VALVE    Current Medications: Outpatient Medications Prior to Visit  Medication Sig Dispense Refill  . aspirin EC 81 MG tablet Take 81 mg by mouth  every evening.    Marland Kitchen atorvastatin (LIPITOR) 40 MG tablet Take 1 tablet (40 mg total) by mouth daily. 90 tablet 3  . BIOTIN PO Take 1 tablet by mouth daily.    . clopidogrel (PLAVIX) 75 MG tablet Take 1 tablet (75 mg total) by mouth daily with breakfast. 90 tablet 1  . metoprolol tartrate (LOPRESSOR) 25 MG tablet Take 1 tablet (25 mg total) by mouth 2 (two) times daily. 180 tablet 3  . Multiple Vitamin (MULTIVITAMIN WITH MINERALS) TABS tablet Take 1 tablet by mouth daily.     No facility-administered medications prior to visit.      Allergies:   Advil [ibuprofen]; Penicillins; Demerol [meperidine]; Other; and Cortisone   Social History   Socioeconomic History  . Marital status: Married    Spouse name: Not on file  . Number of children: Not on file  . Years of education: Not on file  . Highest education level: Not on file  Occupational History  . Occupation: self employed  Social Needs  . Financial resource strain: Not on file  . Food insecurity:    Worry: Not on file    Inability: Not on file  . Transportation needs:    Medical: Not on file    Non-medical: Not on file  Tobacco Use  . Smoking status: Former Smoker    Packs/day: 2.00    Years: 15.00    Pack years: 30.00    Types: Cigarettes    Last attempt to quit: 09/03/1968    Years since quitting: 49.4  . Smokeless tobacco: Never Used  . Tobacco comment: 1-2 ppd. , NONE IN 40 YEARS  Substance and Sexual Activity  . Alcohol use: No  . Drug use: No  . Sexual activity: Not on file  Lifestyle  . Physical activity:    Days per week: Not on file    Minutes per session: Not on file  . Stress: Not on file  Relationships  . Social connections:    Talks on phone: Not on file    Gets together: Not on file    Attends religious service: Not on file    Active member of club or organization: Not on file    Attends meetings of clubs or organizations: Not on file    Relationship status: Not on file  Other Topics Concern  . Not  on file  Social History Narrative  . Not on file     Family History:  The patient's family history includes Diabetes in her maternal grandfather and maternal uncle; Hashimoto's thyroiditis in her daughter and son; Heart attack (age of onset: 3) in her mother; Heart disease in her mother; Prostate cancer in her father.      ROS:   Please see the history of present illness.    ROS All other systems reviewed and are negative.   PHYSICAL EXAM:   VS:  BP 122/60 (BP Location: Left Arm, Patient Position: Sitting, Cuff Size: Large)   Pulse (!) 54   Ht 5' 1"  (1.549 m)   SpO2 94%   BMI 45.82 kg/m    Affect appropriate Obese white female  HEENT: normal Neck supple with no adenopathy JVP normal no  bruits no thyromegaly Lungs clear with no wheezing and good diaphragmatic motion Heart:  S1/S2 SEM  murmur, no rub, gallop or click PMI normal Abdomen: benighn, BS positve, no tenderness, no AAA no bruit.  No HSM or HJR Distal pulses intact with no bruits No edema Neuro non-focal Skin warm and dry Post bilateral TKR     Wt Readings from Last 3 Encounters:  07/12/17 242 lb 8 oz (110 kg)  05/23/17 245 lb 1.9 oz (111.2 kg)  05/02/17 240 lb (108.9 kg)      Studies/Labs Reviewed:   EKG:  EKG is NOT ordered today.  Recent Labs: 04/18/2017: ALT 21 04/24/2017: BUN 7; Creatinine, Ser 0.71; Hemoglobin 11.1; Magnesium 1.8; Platelets 203; Potassium 3.8; Sodium 140   Lipid Panel    Component Value Date/Time   CHOL 203 (H) 09/15/2015 1537   TRIG 241 (H) 09/15/2015 1537   HDL 42 (L) 09/15/2015 1537   CHOLHDL 4.8 09/15/2015 1537   VLDL 48 (H) 09/15/2015 1537   LDLCALC 113 09/15/2015 1537   LDLDIRECT 177.0 03/16/2015 1452    Additional studies/ records that were reviewed today include:  04/23/17 Procedure:  Transcatheter Aortic Valve Replacement - Percutaneous RightTransfemoral Approach Medtronic Evolut-Pro (size 12m, model # Evolutpro29US, serial #  [id number]  Co-Surgeons:Bryan KAlveria Apley MD and MSherren Mocha MD   Anesthesiologist:Terry MOrene Desanctis MD  EDala Dock MD  Pre-operative Echo Findings: ? Severe aortic stenosis and moderate AI ? Normalleft ventricular systolic function  Post-operative Echo Findings: ? trivialparavalvular leak ? Normalleft ventricular systolic function ? Normal prosthetic transvalvular gradient of 873mHg.  _____________  2D ECHO 04/24/17 Study Conclusions - Left ventricle: There was mild concentric hypertrophy. Systolic function was normal. The estimated ejection fraction was in the range of 60% to 65%. The study is not technically sufficient to allow evaluation of LV diastolic function. - Aortic valve: Post TAVR normal appearing Medtronic Resolut 29 mm valve with no signficant peri valvular regurgitation. Valve area (VTI): 1.5 cm^2. Valve area (Vmax): 1.62 cm^2. Valve area (Vmean): 1.49 cm^2. - Mitral valve: Severely calcified annulus. Moderately thickened, moderately calcified leaflets . The findings are consistent with mild stenosis. Valve area by continuity equation (using LVOT flow): 1.22 cm^2. - Left atrium: The atrium was moderately dilated. - Atrial septum: No defect or patent foramen ovale was identified  _____________  2D ECHO 05/23/17   Study Conclusions - Left ventricle: The cavity size was normal. There was moderate   concentric hypertrophy. Systolic function was normal. The   estimated ejection fraction was in the range of 60% to 65%. The   study is not technically sufficient to allow evaluation of LV   diastolic function. - Aortic valve: Post TAVR with 29 mm Resolut Pro valve Stable   gradients and trivial peri valvular regurgitation. Mean gradient   (S): 9 mm Hg. Peak gradient (S): 17 mm Hg. - Mitral valve: Severe MAC with mild functional MS. - Left atrium: The atrium was  moderately dilated. - Atrial septum: No defect or patent foramen ovale was identified.   ASSESSMENT & PLAN:   Severe AS s/p TAVR:  NHYA class 1. 2D ECHO shows good valve positioning and trival PVL. Mean gradient 9 and peak gradient 17. She will stop plavix after 6 months and continue ASA 811maily. She is aware of the need for SBE prophylaxis for any dental work and certain surgeries. She has a penicillin allergy so we have prescribed clindamycin 600m25m take 1 hours prior to dental work. Plan for  repeat echo in 1 year and follow up in valve clinic.  HTN: We will increase her lopressor from 12.22m BID to 255mBID.   HLD: continue statin   CAD: continue asa, statin and BB circumflex lesion is very tight no angina and normal myovue 07/2017 will repeat this November   LBBB: we discussed how this is common after TAVR and nothing to worry about   PeJenkins Rouge

## 2018-02-06 ENCOUNTER — Ambulatory Visit: Payer: Self-pay | Admitting: Cardiovascular Disease

## 2018-02-06 ENCOUNTER — Telehealth: Payer: Self-pay | Admitting: Cardiovascular Disease

## 2018-02-06 MED ORDER — NITROGLYCERIN 0.4 MG SL SUBL: 0.4000 mg | SUBLINGUAL_TABLET | SUBLINGUAL | 3 refills | Status: DC | PRN

## 2018-02-06 NOTE — Telephone Encounter (Signed)
Pt calling stating that Dr. Johnsie Cancel stated at [facility] on 02/04/18, that he would send in a Rx for Nitroglycerin. This medication is not on pt med list and I do not see where Dr. Johnsie Cancel stated in the [facility] note that he would start this medication. Please address

## 2018-02-06 NOTE — Telephone Encounter (Signed)
Per Dr. Johnsie Cancel patient needs nitro as needed for chest pain. Sent in prescription for nitro to patient's pharmacy of choice.

## 2018-02-18 DIAGNOSIS — M25522 Pain in left elbow: Secondary | ICD-10-CM | POA: Diagnosis not present

## 2018-02-18 DIAGNOSIS — M7022 Olecranon bursitis, left elbow: Secondary | ICD-10-CM | POA: Diagnosis not present

## 2018-03-19 ENCOUNTER — Telehealth: Payer: Self-pay | Admitting: Cardiovascular Disease

## 2018-03-19 NOTE — Telephone Encounter (Signed)
New Message:      Pt states she would like to talk with a Therapist, sports. Pt did not want to disclose the nature of the call. She states she rather explain in to the nurse.

## 2018-03-19 NOTE — Telephone Encounter (Signed)
Patient had a personal question about her husband. Nothing to chart on this encounter.

## 2018-04-15 ENCOUNTER — Telehealth: Payer: Self-pay

## 2018-04-15 NOTE — Telephone Encounter (Signed)
New Message:     Please call, she says she needs to changer her TAVR and Echo appointment.

## 2018-04-17 ENCOUNTER — Other Ambulatory Visit (HOSPITAL_COMMUNITY): Payer: Self-pay

## 2018-04-17 ENCOUNTER — Ambulatory Visit: Payer: Self-pay | Admitting: Physician Assistant

## 2018-04-21 NOTE — Progress Notes (Signed)
[facility]                                       Cardiology Office Note    Date:  04/24/2018   ID:  Debra Knight, DOB 02-23-1946, MRN [id number]  PCP:  Patient, No Pcp Per  Cardiologist:  Dr. Johnsie Cancel / Dr. Burt Knack & Dr. Cyndia Bent (TAVR)  CC: 1 year s/p TAVR   History of Present Illness:  Debra Knight is a 72 y.o. female with a history of CAD, HLD, obesity, DJD and severe aortic stenosis s/p TAVR (04/23/17) who presents to clinic for follow up.   She has followed by Dr. Johnsie Cancel for aortic stenosis. Echocardiogram performed in 2017 revealed moderate aortic stenosis with normal left ventricular systolic function. Follow-up echocardiogram performed 01/08/2017 revealed significant progression of disease with peak velocity across the aortic valve measured 4.5 m/s corresponding to mean transvalvular gradient estimated 43 mmHg and aortic valve area calculated only 0.47 cm. Left ventricular systolic function remained normal with ejection fraction estimated 60-65%. Diagnostic cardiac catheterization confirmed the presence of severe aortic stenosis with peak to peak and mean transvalvular gradients measured 48 and 32 mmHg respectively. The patient was also noted to have severe single-vessel coronary artery disease with 80% stenosis involving the left circumflex coronary artery. This was treated medically.   She underwent successful TAVR with a 75m Medtronic CoreValve Evolut Pro under general anaesthesia via right TF approach on 04/23/17. Postoperative echo showed normal valve placement with no significant PVL. She was started on ASA/plavix. One month echo showed grgood valve positioning and trival PVL. Mean gradient 9 and peak gradient 17.   Myoview 07/2017 showed normal perfusion.   Today she presents to clinic for follow up. She is doing great with no symptoms. She has gained more weight which is upsetting her. [place] diet[place] CP or SOB. No LE edema, orthopnea or PND. No dizziness or syncope. No blood in stool or urine. No palpitations. She worries about her 80% LCx blockage.      Past Medical History:  Diagnosis Date  . Aortic stenosis, severe    a. 04/2017: s/p TAVR wtih a Medtronic Evolut-Pro (size 263m model # Evolutpro29US, serial # [id number] . CAD (coronary artery disease)    a. pre-TAVR cath 01/2017 showed 80% LCX stenosis. No angina. Plan to treat medically  . Carotid artery disease ([facility]  . Enlarged thyroid   . GERD (gastroesophageal reflux disease)   . Hyperlipidemia   . OSA (obstructive sleep apnea)    no cpap   . Osteoarthritis of both knees   . Tinnitus     Past Surgical History:  Procedure Laterality Date  . BIOPSY THYROID  2015  . DILATION AND CURETTAGE OF UTERUS    . LEFT HEART CATH AND CORONARY ANGIOGRAPHY N/A 01/15/2017   Procedure: Left Heart Cath and Coronary Angiography;  Surgeon: CoSherren MochaMD;  [facility];  Service: Cardiovascular;  Laterality: N/A;  . TEE WITHOUT CARDIOVERSION N/A 04/23/2017   Procedure: TRANSESOPHAGEAL ECHOCARDIOGRAM (TEE);  Surgeon: CoSherren MochaMD;  [facility] Service: Open Heart Surgery;  Laterality: N/A;  . TONSILLECTOMY  age [age]. TOTAL KNEE ARTHROPLASTY Right 12/13/2014   Procedure: RIGHT TOTAL KNEE ARTHROPLASTY;  Surgeon: FrGaynelle ArabianMD;  [facility];  Service: Orthopedics;  Laterality: Right;  . TOTAL KNEE ARTHROPLASTY Left 02/20/2016   Procedure: LEFT TOTAL KNEE ARTHROPLASTY;  Surgeon: Gaynelle Arabian, MD;  [facility];  Service: Orthopedics;  Laterality: Left;  . TRANSCATHETER AORTIC VALVE REPLACEMENT, TRANSFEMORAL N/A 04/23/2017   Procedure: TRANSCATHETER AORTIC VALVE REPLACEMENT, TRANSFEMORAL;  Surgeon: [name], MD;  Location: [place];  Service: Open Heart Surgery;  Laterality: N/A;  MEDTRONIC VALVE    Current Medications: Outpatient Medications Prior to Visit  Medication Sig Dispense  Refill  . aspirin EC 81 MG tablet Take 81 mg by mouth every evening.    Marland Kitchen atorvastatin (LIPITOR) 40 MG tablet Take 1 tablet (40 mg total) by mouth daily. 90 tablet 3  . BIOTIN PO Take 1 tablet by mouth daily.    . clindamycin (CLEOCIN) 300 MG capsule Take 2 tablets by mouth 60 minutes prior to dental appointments    . metoprolol tartrate (LOPRESSOR) 25 MG tablet Take 1 tablet (25 mg total) by mouth 2 (two) times daily. 180 tablet 3  . Multiple Vitamin (MULTIVITAMIN WITH MINERALS) TABS tablet Take 1 tablet by mouth daily.    . nitroGLYCERIN (NITROSTAT) 0.4 MG SL tablet Place 1 tablet (0.4 mg total) under the tongue every 5 (five) minutes as needed for chest pain. 25 tablet 3  . clopidogrel (PLAVIX) 75 MG tablet Take 1 tablet (75 mg total) by mouth daily with breakfast. (Patient not taking: Reported on 04/23/2018) 90 tablet 1   No facility-administered medications prior to visit.      Allergies:   Advil [ibuprofen]; Penicillins; Demerol [meperidine]; Other; and Cortisone   Social History   Socioeconomic History  . Marital status: Married    Spouse name: Not on file  . Number of children: Not on file  . Years of education: Not on file  . Highest education level: Not on file  Occupational History  . Occupation: self employed  Social Needs  . Financial resource strain: Not on file  . Food insecurity:    Worry: Not on file    Inability: Not on file  . Transportation needs:    Medical: Not on file    Non-medical: Not on file  Tobacco Use  . Smoking status: Former Smoker    Packs/day: 2.00    Years: 15.00    Pack years: 30.00    Types: Cigarettes    Last attempt to quit: 09/03/1968    Years since quitting: 49.6  . Smokeless tobacco: Never Used  . Tobacco comment: 1-2 ppd. , NONE IN 40 YEARS  Substance and Sexual Activity  . Alcohol use: No  . Drug use: No  . Sexual activity: Not on file  Lifestyle  . Physical activity:    Days per week: Not on file    Minutes per session: Not  on file  . Stress: Not on file  Relationships  . Social connections:    Talks on phone: Not on file    Gets together: Not on file    Attends religious service: Not on file    Active member of club or organization: Not on file    Attends meetings of clubs or organizations: Not on file    Relationship status: Not on file  Other Topics Concern  . Not on file  Social History Narrative  . Not on file     Family History:  The patient's family history includes Diabetes in her maternal grandfather and maternal uncle; Hashimoto's thyroiditis in her daughter and son;  Heart attack (age of onset: 35) in her mother; Heart disease in her mother; Prostate cancer in her father.      ROS:   Please see the history of present illness.    ROS All other systems reviewed and are negative.   PHYSICAL EXAM:   VS:  BP 130/78   Pulse 74   Ht _0  (1.549 m)   Wt 249 lb (112.9 kg)   SpO2 98%   BMI 47.05 kg/m    GEN: Well nourished, well developed, in no acute distress. obese HEENT: normal  Neck: no JVD, carotid bruits, or masses Cardiac: RRR; soft flow murmur. No rubs, or gallops,no edema  Respiratory:  clear to auscultation bilaterally, normal work of breathing GI: soft, nontender, nondistended, + BS MS: no deformity or atrophy  Skin: warm and dry, no rash Neuro:  Alert and Oriented x 3, Strength and sensation are intact Psych: euthymic mood, full affect    Wt Readings from Last 3 Encounters:  04/23/18 249 lb (112.9 kg)  07/12/17 242 lb 8 oz (110 kg)  05/23/17 245 lb 1.9 oz (111.2 kg)      Studies/Labs Reviewed:   EKG:  EKG is NOT ordered today.    Recent Labs: No results found for requested labs within last 8760 hours.   Lipid Panel    Component Value Date/Time   CHOL 203 (H) 09/15/2015 1537   TRIG 241 (H) 09/15/2015 1537   HDL 42 (L) 09/15/2015 1537   CHOLHDL 4.8 09/15/2015 1537   VLDL 48 (H) 09/15/2015 1537   LDLCALC 113 09/15/2015 1537   LDLDIRECT 177.0 03/16/2015 1452      Additional studies/ records that were reviewed today include:  04/23/17 Procedure:  Transcatheter Aortic Valve Replacement - Percutaneous RightTransfemoral Approach Medtronic Evolut-Pro (size 73m, model # Evolutpro29US, serial # [id number]  Co-Surgeons:Bryan KAlveria Apley MD and MSherren Mocha MD   Pre-operative Echo Findings: ? Severe aortic stenosis and moderate AI ? Normalleft ventricular systolic function  Post-operative Echo Findings: ? trivialparavalvular leak ? Normalleft ventricular systolic function ? Normal prosthetic transvalvular gradient of 853mHg.  _____________   2D ECHO 05/23/17 (1 month post op)  Study Conclusions - Left ventricle: The cavity size was normal. There was moderate concentric hypertrophy. Systolic function was normal. The estimated ejection fraction was in the range of 60% to 65%. The study is not technically sufficient to allow evaluation of LV diastolic function. - Aortic valve: Post TAVR with 29 mm Resolut Pro valve Stable gradients and trivial peri valvular regurgitation. Mean gradient (S): 9 mm Hg. Peak gradient (S): 17 mm Hg. - Mitral valve: Severe MAC with mild functional MS. - Left atrium: The atrium was moderately dilated. - Atrial septum: No defect or patent foramen ovale was identified.  _____________   2D ECHO 04/23/18 ( 1 year post op)  Study Conclusions - Left ventricle: The cavity size was normal. Wall thickness was   increased in a pattern of moderate LVH. Systolic function was   normal. The estimated ejection fraction was in the range of 60%   to 65%. There was dynamic obstruction at rest in the mid cavity,   with a peak velocity of 240 cm/sec and a peak gradient of 23 mm   Hg. The gradient increased to 312 cm/s with Valsalva. Wall motion   was normal; there were no regional wall motion abnormalities.   Doppler parameters are consistent with abnormal left ventricular    relaxation (grade 1 diastolic dysfunction). Doppler parameters  are consistent with high ventricular filling pressure. - Aortic valve: A Resolut provalve 52m TAVR bioprosthesis was   present and functioning properly. Transvalvular velocity was   within the normal range. There was no stenosis. There was no   regurgitation. Peak velocity (S): 227 cm/s. Mean gradient (S): 10   mm Hg. Peak gradient (S): 21 mm Hg. - Mitral valve: Transvalvular velocity was within the normal range.   There was no evidence for stenosis. There was no regurgitation. - Left atrium: The atrium was mildly dilated. - Right ventricle: The cavity size was normal. Wall thickness was   normal. Systolic function was normal. - Atrial septum: No defect or patent foramen ovale was identified   by color flow Doppler. - Tricuspid valve: There was trivial regurgitation. - Pulmonary arteries: Systolic pressure was within the normal   range. PA peak pressure: 29 mm Hg (S).  ASSESSMENT & PLAN:   Severe AS s/p TAVR: 2D ECHO today shows EF 60%, normally functioning TAVR with mean gradient 10 mm Hg and no PVL. She has NHYA class I symptoms. She is not very active but plans to start exercising. She has no limitations in her daily activities. She will continue on ASA 81 mg daily indefinitely. She has clindamycin for SBE prophylaxis.   HTN: BP well controlled today. Continue Lopressor    HLD: continue statin  CAD: she is asymptomatic. Myoview 07/2017 showed normal perfusion. Continue medical management w/ ASA, statin and BB.   Morbid obesity: Body mass index is 47.05 kg/m.  She is very upset about her weight. She plans to start a diet soon.    Medication Adjustments/Labs and Tests Ordered: Current medicines are reviewed at length with the patient today.  Concerns regarding medicines are outlined above.  Medication changes, Labs and Tests ordered today are listed in the Patient Instructions below. Patient Instructions   Medication Instructions:  Your physician recommends that you continue on your current medications as directed. Please refer to the Current Medication list given to you today.   Labwork: none  Testing/Procedures: none  Follow-Up: Your physician recommends that you schedule a follow-up appointment in: December with Dr. NJohnsie Cancel   Any Other Special Instructions Will Be Listed Below (If Applicable).     If you need a refill on your cardiac medications before your next appointment, please call your pharmacy.      Signed, KAngelena Form PA-C  04/24/2018 9:52 AM    [facility] [address]  [id number]: [phone] Fax: [phone]

## 2018-04-23 ENCOUNTER — Other Ambulatory Visit: Payer: Self-pay

## 2018-04-23 ENCOUNTER — Ambulatory Visit (HOSPITAL_COMMUNITY): Payer: Medicare Other | Attending: Cardiovascular Disease

## 2018-04-23 ENCOUNTER — Ambulatory Visit (INDEPENDENT_AMBULATORY_CARE_PROVIDER_SITE_OTHER): Payer: Medicare Other | Admitting: Physician Assistant

## 2018-04-23 ENCOUNTER — Encounter: Payer: Self-pay | Admitting: Physician Assistant

## 2018-04-23 VITALS — BP 130/78 | HR 74 | Ht 61.0 in | Wt 249.0 lb

## 2018-04-23 DIAGNOSIS — Z952 Presence of prosthetic heart valve: Secondary | ICD-10-CM | POA: Diagnosis not present

## 2018-04-23 DIAGNOSIS — E785 Hyperlipidemia, unspecified: Secondary | ICD-10-CM

## 2018-04-23 DIAGNOSIS — G4733 Obstructive sleep apnea (adult) (pediatric): Secondary | ICD-10-CM | POA: Insufficient documentation

## 2018-04-23 DIAGNOSIS — Z48812 Encounter for surgical aftercare following surgery on the circulatory system: Secondary | ICD-10-CM | POA: Insufficient documentation

## 2018-04-23 DIAGNOSIS — I10 Essential (primary) hypertension: Secondary | ICD-10-CM | POA: Insufficient documentation

## 2018-04-23 DIAGNOSIS — I358 Other nonrheumatic aortic valve disorders: Secondary | ICD-10-CM | POA: Diagnosis present

## 2018-04-23 DIAGNOSIS — I2510 Atherosclerotic heart disease of native coronary artery without angina pectoris: Secondary | ICD-10-CM

## 2018-04-23 NOTE — Patient Instructions (Addendum)
Medication Instructions:  Your physician recommends that you continue on your current medications as directed. Please refer to the Current Medication list given to you today.   Labwork: none  Testing/Procedures: none  Follow-Up: Your physician recommends that you schedule a follow-up appointment in: December with Dr. Johnsie Cancel    Any Other Special Instructions Will Be Listed Below (If Applicable).     If you need a refill on your cardiac medications before your next appointment, please call your pharmacy.

## 2018-05-22 ENCOUNTER — Telehealth: Payer: Self-pay | Admitting: General Practice

## 2018-05-22 NOTE — Telephone Encounter (Signed)
Copied from [place] [id number]. Topic: Quick Communication - See Telephone Encounter >> May 22, 2018  9:59 AM Debra Knight R wrote: Pt is calling in to request to be seen as a new patient by Dr Larose Kells. She states her husband is Doyle Askew and hes a patient of Dr Larose Kells

## 2018-05-22 NOTE — Telephone Encounter (Signed)
Please advise 

## 2018-05-22 NOTE — Telephone Encounter (Signed)
appt scheduled for 06/03/18

## 2018-05-22 NOTE — Telephone Encounter (Signed)
Okay to schedule NP appt at her convenience.  

## 2018-05-22 NOTE — Telephone Encounter (Signed)
Is ok, thx  

## 2018-05-26 ENCOUNTER — Encounter: Payer: Self-pay | Admitting: Thoracic Surgery (Cardiothoracic Vascular Surgery)

## 2018-06-03 ENCOUNTER — Ambulatory Visit (INDEPENDENT_AMBULATORY_CARE_PROVIDER_SITE_OTHER): Payer: Medicare Other | Admitting: Internal Medicine

## 2018-06-03 ENCOUNTER — Encounter: Payer: Self-pay | Admitting: Internal Medicine

## 2018-06-03 VITALS — BP 136/72 | HR 61 | Temp 98.1°F | Resp 16 | Ht 61.0 in | Wt 252.0 lb

## 2018-06-03 DIAGNOSIS — I2510 Atherosclerotic heart disease of native coronary artery without angina pectoris: Secondary | ICD-10-CM

## 2018-06-03 DIAGNOSIS — Z23 Encounter for immunization: Secondary | ICD-10-CM

## 2018-06-03 DIAGNOSIS — E119 Type 2 diabetes mellitus without complications: Secondary | ICD-10-CM | POA: Diagnosis not present

## 2018-06-03 DIAGNOSIS — E1159 Type 2 diabetes mellitus with other circulatory complications: Secondary | ICD-10-CM

## 2018-06-03 DIAGNOSIS — E041 Nontoxic single thyroid nodule: Secondary | ICD-10-CM

## 2018-06-03 DIAGNOSIS — E7849 Other hyperlipidemia: Secondary | ICD-10-CM | POA: Diagnosis not present

## 2018-06-03 DIAGNOSIS — G4733 Obstructive sleep apnea (adult) (pediatric): Secondary | ICD-10-CM

## 2018-06-03 DIAGNOSIS — I10 Essential (primary) hypertension: Secondary | ICD-10-CM

## 2018-06-03 LAB — CBC WITH DIFFERENTIAL/PLATELET
BASOS PCT: 1.1 % (ref 0.0–3.0)
Basophils Absolute: 0.1 10*3/uL (ref 0.0–0.1)
Eosinophils Absolute: 0.2 10*3/uL (ref 0.0–0.7)
Eosinophils Relative: 2.6 % (ref 0.0–5.0)
HEMATOCRIT: 41.9 % (ref 36.0–46.0)
Hemoglobin: 13.9 g/dL (ref 12.0–15.0)
LYMPHS PCT: 23 % (ref 12.0–46.0)
Lymphs Abs: 1.7 10*3/uL (ref 0.7–4.0)
MCHC: 33.1 g/dL (ref 30.0–36.0)
MCV: 87.5 fl (ref 78.0–100.0)
Monocytes Absolute: 0.4 10*3/uL (ref 0.1–1.0)
Monocytes Relative: 5.8 % (ref 3.0–12.0)
NEUTROS ABS: 5.1 10*3/uL (ref 1.4–7.7)
Neutrophils Relative %: 67.5 % (ref 43.0–77.0)
PLATELETS: 228 10*3/uL (ref 150.0–400.0)
RBC: 4.79 Mil/uL (ref 3.87–5.11)
RDW: 14.6 % (ref 11.5–15.5)
WBC: 7.5 10*3/uL (ref 4.0–10.5)

## 2018-06-03 LAB — HEMOGLOBIN A1C: Hgb A1c MFr Bld: 7.9 % — ABNORMAL HIGH (ref 4.6–6.5)

## 2018-06-03 NOTE — Progress Notes (Signed)
Subjective:    Patient ID: Debra Knight, female    DOB: 26-Jan-1946, 72 y.o.   MRN: [id number]  DOS:  06/03/2018 Type of visit - description : New patient, here with her husband, to get established Interval history: CAD, aortic stenosis: Chart reviewed Hyperlipidemia: On statins, due for labs History of thyroid nodule: No recent labs or ultrasound OSA: Not using a CPAP. Insomnia: For years, she lays down and she cannot sleep, gets at most 4 to 5 hours at night.  Tried OTC medication and it was too strong.  Name?.  She has some stress due to   other family members health issues but no anxiety or depression per se.   Review of Systems Occasionally has a globus sensation in the throat.  Denies odynophagia, dysphasia or frequent heartburn. Concerned about sxs being related to her thyroid, would like some testing done. History of sleep apnea, does not use a CPAP, denies feeling sleepy throughout the day, she does take a 15-minute nap most days and she feels much better after that. Denies chest pain or difficulty breathing No nausea, vomiting, diarrhea  Past Medical History:  Diagnosis Date  . Aortic stenosis, severe    a. 04/2017: s/p TAVR wtih a Medtronic Evolut-Pro (size 2mm, model # Evolutpro29US, serial # [id number])  . CAD (coronary artery disease)    a. pre-TAVR cath 01/2017 showed 80% LCX stenosis. No angina. Plan to treat medically  . Enlarged thyroid   . GERD (gastroesophageal reflux disease)   . Hyperlipidemia   . OSA (obstructive sleep apnea)    no cpap   . Osteoarthritis of both knees   . Tinnitus     Past Surgical History:  Procedure Laterality Date  . BIOPSY THYROID  2015  . DILATION AND CURETTAGE OF UTERUS    . LEFT HEART CATH AND CORONARY ANGIOGRAPHY N/A 01/15/2017   Procedure: Left Heart Cath and Coronary Angiography;  Surgeon: [name], MD;  Location: [place] [facility];  Service: Cardiovascular;  Laterality: N/A;  . TEE WITHOUT CARDIOVERSION N/A 04/23/2017    Procedure: TRANSESOPHAGEAL ECHOCARDIOGRAM (TEE);  Surgeon: [name], MD;  Location: [place];  Service: Open Heart Surgery;  Laterality: N/A;  . TONSILLECTOMY  age 71  . TOTAL KNEE ARTHROPLASTY Right 12/13/2014   Procedure: RIGHT TOTAL KNEE ARTHROPLASTY;  Surgeon: Gaynelle Arabian, MD;  Location: [facility];  Service: Orthopedics;  Laterality: Right;  . TOTAL KNEE ARTHROPLASTY Left 02/20/2016   Procedure: LEFT TOTAL KNEE ARTHROPLASTY;  Surgeon: Gaynelle Arabian, MD;  Location: [facility];  Service: Orthopedics;  Laterality: Left;  . TRANSCATHETER AORTIC VALVE REPLACEMENT, TRANSFEMORAL N/A 04/23/2017   Procedure: TRANSCATHETER AORTIC VALVE REPLACEMENT, TRANSFEMORAL;  Surgeon: [name], MD;  Location: [place];  Service: Open Heart Surgery;  Laterality: N/A;  MEDTRONIC VALVE    Social History   Socioeconomic History  . Marital status: Married    Spouse name: Not on file  . Number of children: 2  . Years of education: Not on file  . Highest education level: Not on file  Occupational History  . Occupation: self employed  Social Needs  . Financial resource strain: Not on file  . Food insecurity:    Worry: Not on file    Inability: Not on file  . Transportation needs:    Medical: Not on file    Non-medical: Not on file  Tobacco Use  . Smoking status: Former Smoker    Packs/day: 2.00    Years: 15.00    Pack  years: 30.00    Types: Cigarettes    Last attempt to quit: 09/03/1968    Years since quitting: 49.7  . Smokeless tobacco: Never Used  . Tobacco comment: 1-2 ppd. , NONE IN 40 YEARS  Substance and Sexual Activity  . Alcohol use: No  . Drug use: No  . Sexual activity: Not on file  Lifestyle  . Physical activity:    Days per week: Not on file    Minutes per session: Not on file  . Stress: Not on file  Relationships  . Social connections:    Talks on phone: Not on file    Gets together: Not on file    Attends religious service: Not on file    Active member of club or organization:  Not on file    Attends meetings of clubs or organizations: Not on file    Relationship status: Not on file  . Intimate partner violence:    Fear of current or ex partner: Not on file    Emotionally abused: Not on file    Physically abused: Not on file    Forced sexual activity: Not on file  Other Topics Concern  . Not on file  Social History Narrative   Household: pt and husband      Family History  Problem Relation Age of Onset  . Heart disease Mother   . Heart attack Mother 26  . Prostate cancer Father   . Diabetes Maternal Grandfather   . Diabetes Maternal Uncle   . Hashimoto's thyroiditis Daughter   . Hashimoto's thyroiditis Son   . Breast cancer Neg Hx      Allergies as of 06/03/2018      Reactions   Advil [ibuprofen] Anaphylaxis, Swelling, Other (See Comments)   Throat swelling per patient   Penicillins Hives, Swelling, Other (See Comments)   Tongue swelling Has patient had a PCN reaction causing immediate rash, facial/tongue/throat swelling, SOB or lightheadedness with hypotension: yes Has patient had a PCN reaction causing severe rash involving mucus membranes or skin necrosis: no Has patient had a PCN reaction that required hospitalization no Has patient had a PCN reaction occurring within the last 10 years: no If all of the above answers are "NO", then may proceed with Cepha   Demerol [meperidine] Nausea And Vomiting   Other Hives, Other (See Comments)   Duck   Cortisone Anxiety, Other (See Comments)   Loopy       Medication List        Accurate as of 06/03/18 11:59 PM. Always use your most recent med list.          aspirin EC 81 MG tablet Take 81 mg by mouth every evening.   atorvastatin 40 MG tablet Commonly known as:  LIPITOR Take 1 tablet (40 mg total) by mouth daily.   BIOTIN PO Take 1 tablet by mouth daily.   clindamycin 300 MG capsule Commonly known as:  CLEOCIN Take 2 tablets by mouth 60 minutes prior to dental appointments     metoprolol tartrate 25 MG tablet Commonly known as:  LOPRESSOR Take 1 tablet (25 mg total) by mouth 2 (two) times daily.   multivitamin with minerals Tabs tablet Take 1 tablet by mouth daily.   nitroGLYCERIN 0.4 MG SL tablet Commonly known as:  NITROSTAT Place 1 tablet (0.4 mg total) under the tongue every 5 (five) minutes as needed for chest pain.          Objective:   Physical Exam  BP 136/72 (BP Location: Left Arm, Patient Position: Sitting, Cuff Size: Normal)   Pulse 61   Temp 98.1 F (36.7 C) (Oral)   Resp 16   Ht 5\' 1"  (1.549 m)   Wt 252 lb (114.3 kg)   SpO2 97%   BMI 47.61 kg/m  General:   Well developed, NAD, see BMI.  HEENT:  Normocephalic . Face symmetric, atraumatic Neck: No thyromegaly Lungs:  CTA B Normal respiratory effort, no intercostal retractions, no accessory muscle use. Heart: RRR, minimal systolic murmur?    no pretibial edema bilaterally  Abdomen:  Not distended, soft, non-tender. No rebound or rigidity.   Skin: Not pale. Not jaundice Neurologic:  alert & oriented X3.  Speech normal, gait appropriate for age and unassisted Psych--  Cognition and judgment appear intact.  Cooperative with normal attention span and concentration.  Behavior appropriate. No anxious or depressed appearing.     Assessment & Plan:   Assessment DM A1C 7.0 (04/2017) Hyperlipidemia GERD DJD OSA, no CPAP  Morbid obesity CV: -CAD -Aortic stenosis, status post Ivar -Carotid artery disease Thyroid biopsy 2015: Nonneoplastic goiter.  Plan: DM: Last A1c 7.0, recheck today.  Diet and exercise briefly discussed. High cholesterol: Currently on Lipitor, check a FLP OSA: Dx few years ago via a sleep study, could never tolerate a CPAP, she subsequently lost some weight snoring has basically resolved.  Denies feeling sleepy. CAD: On aspirin, metoprolol, asymptomatic, check a CMP, CBC. History of a thyroid nodule: physical exam (-), will check a TSH and  ultrasound. Morbid obesity: Recommend to be seen @ the bariatric office  for further coaching. Insomnia: Chronic, sleeps ~ 5 hours at night, some stress in her life but no anxiety or depression per se.  Previously try a OTC without much help.  Recommend good sleep habits, see AVS.  Also trial with melatonin.  Reassess on RTC. Preventive care reviewed: Td today  Flu shot: will get at the pharmacy [facility] 13: 10/2016; will get PNM 23 at the pharmacy Female care: no recent gyn visit, no recent MMG Never had a cscope  RTC 2-3 months   Today, I spent more than  45  min with the patient: >50% of the time counseling regards her chronic medical problems, counseling her about good sleep habits, discussing potential treatment options including prescribed medications and OTCs. She was also concerned about her thyroid nodule, multiple questions answered.  I did extensive chart review.

## 2018-06-03 NOTE — Patient Instructions (Signed)
GO TO THE LAB : Get the blood work     GO TO THE FRONT DESK Schedule your next appointment for a  Check up in 2-3 months   HEALTHY SLEEP Sleep hygiene: Basic rules for a good night's sleep  Sleep only as much as you need to feel rested and then get out of bed  Keep a regular sleep schedule  Avoid forcing sleep  Exercise regularly for at least 20 minutes, preferably 4 to 5 hours before bedtime  Avoid caffeinated beverages after lunch  Avoid alcohol near bedtime: no "night cap"  Avoid smoking, especially in the evening  Do not go to bed hungry  Adjust bedroom environment  Avoid prolonged use of light-emitting screens before bedtime   Deal with your worries before bedtime    Consider try OTC melatonin

## 2018-06-03 NOTE — Progress Notes (Signed)
Pre visit review using our clinic review tool, if applicable. No additional management support is needed unless otherwise documented below in the visit note. 

## 2018-06-04 DIAGNOSIS — Z09 Encounter for follow-up examination after completed treatment for conditions other than malignant neoplasm: Secondary | ICD-10-CM | POA: Insufficient documentation

## 2018-06-04 DIAGNOSIS — E119 Type 2 diabetes mellitus without complications: Secondary | ICD-10-CM | POA: Insufficient documentation

## 2018-06-04 DIAGNOSIS — E1165 Type 2 diabetes mellitus with hyperglycemia: Secondary | ICD-10-CM | POA: Insufficient documentation

## 2018-06-04 DIAGNOSIS — E1159 Type 2 diabetes mellitus with other circulatory complications: Secondary | ICD-10-CM | POA: Insufficient documentation

## 2018-06-04 LAB — COMPREHENSIVE METABOLIC PANEL
ALK PHOS: 129 U/L — AB (ref 39–117)
ALT: 19 U/L (ref 0–35)
AST: 12 U/L (ref 0–37)
Albumin: 4 g/dL (ref 3.5–5.2)
BUN: 20 mg/dL (ref 6–23)
CALCIUM: 9.2 mg/dL (ref 8.4–10.5)
CO2: 29 mEq/L (ref 19–32)
Chloride: 101 mEq/L (ref 96–112)
Creatinine, Ser: 0.7 mg/dL (ref 0.40–1.20)
GFR: 87.11 mL/min (ref >60.00)
GLUCOSE: 144 mg/dL — AB (ref 70–99)
Potassium: 4.4 mEq/L (ref 3.5–5.1)
Sodium: 139 mEq/L (ref 135–145)
TOTAL PROTEIN: 6.8 g/dL (ref 6.0–8.3)
Total Bilirubin: 0.6 mg/dL (ref 0.2–1.2)

## 2018-06-04 LAB — LIPID PANEL
Cholesterol: 212 mg/dL — ABNORMAL HIGH (ref 0–200)
HDL: 41.2 mg/dL (ref >39.00)
NonHDL: 170.64
Total CHOL/HDL Ratio: 5
Triglycerides: 368 mg/dL — ABNORMAL HIGH (ref 0.0–149.0)
VLDL: 73.6 mg/dL — AB (ref 0.0–40.0)

## 2018-06-04 LAB — TSH: TSH: 1.91 u[IU]/mL (ref 0.35–4.50)

## 2018-06-04 LAB — LDL CHOLESTEROL, DIRECT: Direct LDL: 120 mg/dL

## 2018-06-04 NOTE — Assessment & Plan Note (Signed)
DM: Last A1c 7.0, recheck today.  Diet and exercise briefly discussed. High cholesterol: Currently on Lipitor, check a FLP OSA: Dx few years ago via a sleep study, could never tolerate a CPAP, she subsequently lost some weight snoring has basically resolved.  Denies feeling sleepy. CAD: On aspirin, metoprolol, asymptomatic, check a CMP, CBC. History of a thyroid nodule: physical exam (-), will check a TSH and ultrasound. Morbid obesity: Recommend to be seen @ the bariatric office  for further coaching. Insomnia: Chronic, sleeps ~ 5 hours at night, some stress in her life but no anxiety or depression per se.  Previously try a OTC without much help.  Recommend good sleep habits, see AVS.  Also trial with melatonin.  Reassess on RTC. Preventive care reviewed: Td today  Flu shot: will get at the pharmacy [facility] 13: 10/2016; will get PNM 23 at the pharmacy Female care: no recent gyn visit, no recent MMG Never had a cscope  RTC 2-3 months

## 2018-06-09 MED ORDER — METFORMIN HCL 850 MG PO TABS: 850.0000 mg | ORAL_TABLET | Freq: Two times a day (BID) | ORAL | 5 refills | Status: DC

## 2018-06-09 MED ORDER — ATORVASTATIN CALCIUM 80 MG PO TABS: 80.0000 mg | ORAL_TABLET | Freq: Every day | ORAL | 1 refills | Status: DC

## 2018-06-09 NOTE — Addendum Note (Signed)
Addended byDamita Dunnings D on: 06/09/2018 10:19 AM   Modules accepted: Orders

## 2018-06-19 ENCOUNTER — Ambulatory Visit (HOSPITAL_BASED_OUTPATIENT_CLINIC_OR_DEPARTMENT_OTHER)
Admission: RE | Admit: 2018-06-19 | Discharge: 2018-06-19 | Disposition: A | Discharge status: Home / Self Care | Payer: Medicare Other | Source: Ambulatory Visit | Attending: Internal Medicine | Admitting: Internal Medicine

## 2018-06-19 ENCOUNTER — Telehealth: Payer: Self-pay | Admitting: Internal Medicine

## 2018-06-19 DIAGNOSIS — E041 Nontoxic single thyroid nodule: Secondary | ICD-10-CM | POA: Diagnosis not present

## 2018-06-19 DIAGNOSIS — E042 Nontoxic multinodular goiter: Secondary | ICD-10-CM | POA: Diagnosis not present

## 2018-06-19 NOTE — Telephone Encounter (Signed)
Pt came into office and wanted to speak Dr. Larose Kells or his nurse about her prescription she is taking. (Metformin). Please call her cell phone as soon as possible. [phone] She would like a call today.

## 2018-06-19 NOTE — Telephone Encounter (Signed)
Okay, will have to go very slow: Metformin: 0.5 tablet daily for 1 week Then 0.5 tablets twice a day for 1 week Then  1.5 tablet in the morning and 0.5 night for 1 week Let us know how she does

## 2018-06-19 NOTE — Telephone Encounter (Signed)
Spoke w/ Pt- she started metformin 850mg  yesterday for the first time (she was instructed to take 1 tablet by mouth for the first week and then increase to bid)- after first dose yesterday she has been having nausea, and abdominal cramping. She was informed by pharmacist that this can be normal w/ metformin- however she wants to know if she should take 1/2 tablet or if medication can be switched?

## 2018-06-19 NOTE — Telephone Encounter (Signed)
Spoke w/ Pt- informed of recommendations. Pt verbalized understanding.  

## 2018-06-23 DIAGNOSIS — Z23 Encounter for immunization: Secondary | ICD-10-CM | POA: Diagnosis not present

## 2018-07-30 DIAGNOSIS — K449 Diaphragmatic hernia without obstruction or gangrene: Secondary | ICD-10-CM | POA: Diagnosis not present

## 2018-07-30 DIAGNOSIS — R110 Nausea: Secondary | ICD-10-CM | POA: Diagnosis not present

## 2018-07-30 DIAGNOSIS — R197 Diarrhea, unspecified: Secondary | ICD-10-CM | POA: Diagnosis not present

## 2018-07-30 DIAGNOSIS — K8020 Calculus of gallbladder without cholecystitis without obstruction: Secondary | ICD-10-CM | POA: Diagnosis not present

## 2018-07-30 DIAGNOSIS — K5730 Diverticulosis of large intestine without perforation or abscess without bleeding: Secondary | ICD-10-CM | POA: Diagnosis not present

## 2018-07-30 DIAGNOSIS — R002 Palpitations: Secondary | ICD-10-CM | POA: Diagnosis not present

## 2018-07-30 DIAGNOSIS — R634 Abnormal weight loss: Secondary | ICD-10-CM | POA: Diagnosis not present

## 2018-07-30 DIAGNOSIS — N200 Calculus of kidney: Secondary | ICD-10-CM | POA: Diagnosis not present

## 2018-07-30 DIAGNOSIS — R109 Unspecified abdominal pain: Secondary | ICD-10-CM | POA: Diagnosis not present

## 2018-08-01 DIAGNOSIS — R002 Palpitations: Secondary | ICD-10-CM | POA: Diagnosis not present

## 2018-08-05 ENCOUNTER — Other Ambulatory Visit: Payer: Self-pay | Admitting: Cardiovascular Disease

## 2018-08-05 MED ORDER — METOPROLOL TARTRATE 25 MG PO TABS: 25.0000 mg | ORAL_TABLET | Freq: Two times a day (BID) | ORAL | 2 refills | Status: DC

## 2018-08-12 NOTE — Progress Notes (Signed)
Cardiology Office Note    Date:  08/14/2018   ID:  Debra Knight, DOB Jan 09, 1946, MRN [id number]  PCP:  Colon Branch, MD  Cardiologist: Dr. Johnsie Cancel / Dr. Burt Knack (TAVR)  CC: TAVR/ CAD   History of Present Illness:  Debra Knight is a 72 y.o. female with a history of CAD, HLD, obesity, DJD and severe aortic stenosis s/p TAVR (04/23/17) with Medtronic 29 mm CoreValve Evolut Pro. This was done with general anesthesia and right TF approach. There was no PVL Cath prior to valve did show severe proximal circumflex disease 80% This has been Rx medically. She was thought to be a poor Open surgical candidate due to need for aortic root enlargement poor functional status . She has not had angina She had a normal Non ischemic myovue 08/01/17  Last TTE reviewed 04/23/18 EF 60-65% stable mean gradient 10 mmhg peak 21 mmHg and also  A small dynamic obstruction in mid cavity. No PVL.    She was very upset about her DM. Dr Larose Kells started her on glucophage and it has mad her sick " he did not tell me about side effects" Seen in ER and she held it for a while. Needs alternative Rx    Past Medical History:  Diagnosis Date  . Aortic stenosis, severe    a. 04/2017: s/p TAVR wtih a Medtronic Evolut-Pro (size 66mm, model # Evolutpro29US, serial # [id number])  . CAD (coronary artery disease)    a. pre-TAVR cath 01/2017 showed 80% LCX stenosis. No angina. Plan to treat medically  . Enlarged thyroid   . GERD (gastroesophageal reflux disease)   . Hyperlipidemia   . OSA (obstructive sleep apnea)    no cpap   . Osteoarthritis of both knees   . Tinnitus     Past Surgical History:  Procedure Laterality Date  . BIOPSY THYROID  2015  . DILATION AND CURETTAGE OF UTERUS    . LEFT HEART CATH AND CORONARY ANGIOGRAPHY N/A 01/15/2017   Procedure: Left Heart Cath and Coronary Angiography;  Surgeon: [name], MD;  Location: [place] [facility];  Service: Cardiovascular;  Laterality: N/A;  . TEE WITHOUT  CARDIOVERSION N/A 04/23/2017   Procedure: TRANSESOPHAGEAL ECHOCARDIOGRAM (TEE);  Surgeon: [name], MD;  Location: [place];  Service: Open Heart Surgery;  Laterality: N/A;  . TONSILLECTOMY  age 4  . TOTAL KNEE ARTHROPLASTY Right 12/13/2014   Procedure: RIGHT TOTAL KNEE ARTHROPLASTY;  Surgeon: Gaynelle Arabian, MD;  Location: [facility];  Service: Orthopedics;  Laterality: Right;  . TOTAL KNEE ARTHROPLASTY Left 02/20/2016   Procedure: LEFT TOTAL KNEE ARTHROPLASTY;  Surgeon: Gaynelle Arabian, MD;  Location: [facility];  Service: Orthopedics;  Laterality: Left;  . TRANSCATHETER AORTIC VALVE REPLACEMENT, TRANSFEMORAL N/A 04/23/2017   Procedure: TRANSCATHETER AORTIC VALVE REPLACEMENT, TRANSFEMORAL;  Surgeon: [name], MD;  Location: [place];  Service: Open Heart Surgery;  Laterality: N/A;  MEDTRONIC VALVE    Current Medications: Outpatient Medications Prior to Visit  Medication Sig Dispense Refill  . aspirin EC 81 MG tablet Take 81 mg by mouth every evening.    Marland Kitchen atorvastatin (LIPITOR) 80 MG tablet Take 1 tablet (80 mg total) by mouth at bedtime. 90 tablet 1  . BIOTIN PO Take 1 tablet by mouth daily.    . clindamycin (CLEOCIN) 300 MG capsule Take 2 tablets by mouth 60 minutes prior to dental appointments    . metFORMIN (GLUCOPHAGE) 850 MG tablet Take 1 tablet (850 mg total) by mouth 2 (two)  times daily with a meal. For the first week take only 1 tablet daily with breakfast 60 tablet 5  . metoprolol tartrate (LOPRESSOR) 25 MG tablet Take 1 tablet (25 mg total) by mouth 2 (two) times daily. 180 tablet 2  . Multiple Vitamin (MULTIVITAMIN WITH MINERALS) TABS tablet Take 1 tablet by mouth daily.    . nitroGLYCERIN (NITROSTAT) 0.4 MG SL tablet Place 1 tablet (0.4 mg total) under the tongue every 5 (five) minutes as needed for chest pain. (Patient not taking: Reported on 06/03/2018) 25 tablet 3   No facility-administered medications prior to visit.      Allergies:   Advil [ibuprofen]; Penicillins; Demerol  [meperidine]; Other; and Cortisone   Social History   Socioeconomic History  . Marital status: Married    Spouse name: Not on file  . Number of children: 2  . Years of education: Not on file  . Highest education level: Not on file  Occupational History  . Occupation: self employed  Social Needs  . Financial resource strain: Not on file  . Food insecurity:    Worry: Not on file    Inability: Not on file  . Transportation needs:    Medical: Not on file    Non-medical: Not on file  Tobacco Use  . Smoking status: Former Smoker    Packs/day: 2.00    Years: 15.00    Pack years: 30.00    Types: Cigarettes    Last attempt to quit: 09/03/1968    Years since quitting: 49.9  . Smokeless tobacco: Never Used  . Tobacco comment: 1-2 ppd. , NONE IN 40 YEARS  Substance and Sexual Activity  . Alcohol use: No  . Drug use: No  . Sexual activity: Not on file  Lifestyle  . Physical activity:    Days per week: Not on file    Minutes per session: Not on file  . Stress: Not on file  Relationships  . Social connections:    Talks on phone: Not on file    Gets together: Not on file    Attends religious service: Not on file    Active member of club or organization: Not on file    Attends meetings of clubs or organizations: Not on file    Relationship status: Not on file  Other Topics Concern  . Not on file  Social History Narrative   Household: pt and husband      Family History:  The patient's family history includes Diabetes in her maternal grandfather and maternal uncle; Hashimoto's thyroiditis in her daughter and son; Heart attack (age of onset: 4) in her mother; Heart disease in her mother; Prostate cancer in her father.      ROS:   Please see the history of present illness.    ROS All other systems reviewed and are negative.   PHYSICAL EXAM:   VS:  Ht 5\' 1"  (1.549 m)   Wt 244 lb (110.7 kg)   BMI 46.10 kg/m    Affect appropriate Obese white female  HEENT: normal Neck  supple with no adenopathy JVP normal no bruits no thyromegaly Lungs clear with no wheezing and good diaphragmatic motion Heart:  S1/S2 SEM  murmur, no rub, gallop or click PMI normal Abdomen: benighn, BS positve, no tenderness, no AAA no bruit.  No HSM or HJR Distal pulses intact with no bruits No edema Neuro non-focal Skin warm and dry Post bilateral TKR     Wt Readings from Last 3 Encounters:  08/14/18 244 lb (110.7 kg)  06/03/18 252 lb (114.3 kg)  04/23/18 249 lb (112.9 kg)      Studies/Labs Reviewed:   EKG:  EKG is NOT ordered today.  Recent Labs: 06/03/2018: ALT 19; BUN 20; Creatinine, Ser 0.70; Hemoglobin 13.9; Platelets 228.0; Potassium 4.4; Sodium 139; TSH 1.91   Lipid Panel    Component Value Date/Time   CHOL 212 (H) 06/03/2018 1411   TRIG 368.0 (H) 06/03/2018 1411   HDL 41.20 06/03/2018 1411   CHOLHDL 5 06/03/2018 1411   VLDL 73.6 (H) 06/03/2018 1411   LDLCALC 113 09/15/2015 1537   LDLDIRECT 120.0 06/03/2018 1411    Additional studies/ records that were reviewed today include:  04/23/17 Procedure:  Transcatheter Aortic Valve Replacement - Percutaneous RightTransfemoral Approach Medtronic Evolut-Pro (size 43mm, model # Evolutpro29US, serial # [id number])  Co-Surgeons:Bryan Alveria Apley, MD and [name], MD   Anesthesiologist:Terry Orene Desanctis, MD  Dala Dock, MD  Pre-operative Echo Findings: ? Severe aortic stenosis and moderate AI ? Normalleft ventricular systolic function  Post-operative Echo Findings: ? trivialparavalvular leak ? Normalleft ventricular systolic function ? Normal prosthetic transvalvular gradient of 76mm Hg.  _____________  Echo 04/23/18 Study Conclusions  - Left ventricle: The cavity size was normal. Wall thickness was   increased in a pattern of moderate LVH. Systolic function was   normal. The estimated ejection fraction  was in the range of 60%   to 65%. There was dynamic obstruction at rest in the mid cavity,   with a peak velocity of 240 cm/sec and a peak gradient of 23 mm   Hg. The gradient increased to 312 cm/s with Valsalva. Wall motion   was normal; there were no regional wall motion abnormalities.   Doppler parameters are consistent with abnormal left ventricular   relaxation (grade 1 diastolic dysfunction). Doppler parameters   are consistent with high ventricular filling pressure. - Aortic valve: A Resolut provalve 69mm TAVR bioprosthesis was   present and functioning properly. Transvalvular velocity was   within the normal range. There was no stenosis. There was no   regurgitation. Peak velocity (S): 227 cm/s. Mean gradient (S): 10   mm Hg. Peak gradient (S): 21 mm Hg. - Mitral valve: Transvalvular velocity was within the normal range.   There was no evidence for stenosis. There was no regurgitation. - Left atrium: The atrium was mildly dilated. - Right ventricle: The cavity size was normal. Wall thickness was   normal. Systolic function was normal. - Atrial septum: No defect or patent foramen ovale was identified   by color flow Doppler. - Tricuspid valve: There was trivial regurgitation. - Pulmonary arteries: Systolic pressure was within the normal   range. PA peak pressure: 29 mm Hg (S).    ASSESSMENT & PLAN:   Severe AS s/p TAVR:  NHYA class 1. Post Evolut Pro 29 mm Medtronic CoreValve stable gradients no PVL f/u TTE August 2020 SBE prophylaxis   HTN: Well controlled.  Continue current medications and low sodium Dash type diet.    HLD: continue statin   CAD: continue asa, statin and BB circumflex lesion is very tight no angina and normal myovue 07/2017 will repeat    LBBB: we discussed how this is common after TAVR no high grade AV block Lexiscan for stress testing   DM:  Intolerant to Glucophage with severe GI distress f/u Dr Larose Kells for alternative   Jenkins Rouge

## 2018-08-14 ENCOUNTER — Telehealth: Payer: Self-pay

## 2018-08-14 ENCOUNTER — Ambulatory Visit (INDEPENDENT_AMBULATORY_CARE_PROVIDER_SITE_OTHER): Payer: Medicare Other | Admitting: Cardiovascular Disease

## 2018-08-14 VITALS — Ht 61.0 in | Wt 244.0 lb

## 2018-08-14 DIAGNOSIS — Z952 Presence of prosthetic heart valve: Secondary | ICD-10-CM

## 2018-08-14 DIAGNOSIS — I10 Essential (primary) hypertension: Secondary | ICD-10-CM | POA: Diagnosis not present

## 2018-08-14 DIAGNOSIS — I2510 Atherosclerotic heart disease of native coronary artery without angina pectoris: Secondary | ICD-10-CM

## 2018-08-14 DIAGNOSIS — R079 Chest pain, unspecified: Secondary | ICD-10-CM

## 2018-08-14 DIAGNOSIS — I447 Left bundle-branch block, unspecified: Secondary | ICD-10-CM | POA: Diagnosis not present

## 2018-08-14 NOTE — Patient Instructions (Signed)
Medication Instructions:   If you need a refill on your cardiac medications before your next appointment, please call your pharmacy.   Lab work:  If you have labs (blood work) drawn today and your tests are completely normal, you will receive your results only by: . MyChart Message (if you have MyChart) OR . A paper copy in the mail If you have any lab test that is abnormal or we need to change your treatment, we will call you to review the results.  Testing/Procedures: Your physician has requested that you have a lexiscan myoview. For further information please visit [email or web address]. Please follow instruction sheet, as given.  Follow-Up: At [facility], you and your health needs are our priority.  As part of our continuing mission to provide you with exceptional heart care, we have created designated Provider Care Teams.  These Care Teams include your primary Cardiologist (physician) and Advanced Practice Providers (APPs -  Physician Assistants and Nurse Practitioners) who all work together to provide you with the care you need, when you need it. You will need a follow up appointment in 6 months.  Please call our office 2 months in advance to schedule this appointment.  You may see Peter Nishan, MD or one of the following Advanced Practice Providers on your designated Care Team:   Lori Gerhardt, NP Laura Ingold, NP . Jill McDaniel, NP     

## 2018-08-14 NOTE — Telephone Encounter (Signed)
Debra Knight please call the patient. She was started on metformin and developed diarrhea, eventually went to the ER. Advised patient: We could restart her on Januvia and low-dose, 50 mg daily, side effect profile is very low, nevertheless recommend to read the pharmacy printout.  Follow-up in 2 months.   Alternatively, She could schedule office visit to discuss options.

## 2018-08-14 NOTE — Telephone Encounter (Signed)
Pam from Dr. Jenkins [place] office calling requesting Dr. Ethel Rana cell phone number to relay urgent information related to pt. Pam did not know nature of information, and author asked Pam to ensure it is documented, and she said she would.  Cell phone number given.

## 2018-08-15 NOTE — Telephone Encounter (Signed)
Tried calling Pt- someone picked up and hung up immediately. MyChart message sent to Pt w/ recommendations- instructed her to let us know either way.

## 2018-08-25 ENCOUNTER — Encounter: Payer: Self-pay | Admitting: Internal Medicine

## 2018-08-25 ENCOUNTER — Ambulatory Visit (INDEPENDENT_AMBULATORY_CARE_PROVIDER_SITE_OTHER): Payer: Medicare Other | Admitting: Internal Medicine

## 2018-08-25 VITALS — BP 126/80 | HR 81 | Temp 97.5°F | Resp 16 | Ht 61.0 in | Wt 243.4 lb

## 2018-08-25 DIAGNOSIS — E041 Nontoxic single thyroid nodule: Secondary | ICD-10-CM | POA: Diagnosis not present

## 2018-08-25 DIAGNOSIS — I2510 Atherosclerotic heart disease of native coronary artery without angina pectoris: Secondary | ICD-10-CM | POA: Diagnosis not present

## 2018-08-25 DIAGNOSIS — R911 Solitary pulmonary nodule: Secondary | ICD-10-CM | POA: Diagnosis not present

## 2018-08-25 DIAGNOSIS — R197 Diarrhea, unspecified: Secondary | ICD-10-CM

## 2018-08-25 DIAGNOSIS — E7849 Other hyperlipidemia: Secondary | ICD-10-CM

## 2018-08-25 DIAGNOSIS — E1159 Type 2 diabetes mellitus with other circulatory complications: Secondary | ICD-10-CM | POA: Diagnosis not present

## 2018-08-25 MED ORDER — DICYCLOMINE HCL 10 MG PO CAPS: 10.0000 mg | ORAL_CAPSULE | Freq: Four times a day (QID) | ORAL | 0 refills | Status: DC | PRN

## 2018-08-25 MED ORDER — ONETOUCH LANCETS MISC: 12 refills | Status: DC

## 2018-08-25 MED ORDER — GLUCOSE BLOOD VI STRP: ORAL_STRIP | 12 refills | Status: DC

## 2018-08-25 MED ORDER — ONETOUCH VERIO FLEX SYSTEM W/DEVICE KIT: PACK | 0 refills | Status: DC

## 2018-08-25 NOTE — Progress Notes (Signed)
Subjective:    Patient ID: Debra Knight, female    DOB: 03-Jun-1946, 72 y.o.   MRN: [id number]  DOS:  08/25/2018 Type of visit - description:  --Since the last office visit, cholesterol was elevated, Lipitor dose increased to 80 mg. --Her A1c was 7.9, she was recommended metformin. --She went to the ER 07/30/2018, at that time she reported diarrhea and intermittent abdominal pain since she started with metformin. Work-up at the emergency room showed a normal urine, hemoglobin 13.4, white cells 6.5, platelets 201. Lactic acid normal Sodium 137, potassium 3.7, creatinine 0.6, calcium 8.5 slightly low.  Alkaline phosphatase 127 slightly elevated. LFTs otherwise normal CT of the abdomen with contrast: Diverticulosis, cholelithiasis, nonobstructive left renal stone, 5 mm lower lobe nodule. No other major findings  Wt Readings from Last 3 Encounters:  08/25/18 243 lb 6 oz (110.4 kg)  08/14/18 244 lb (110.7 kg)  06/03/18 252 lb (114.3 kg)     Review of Systems She is here for a follow-up After the ER visit, she held metformin for few days and felt better but then decided to go back on it. "Nobody told me not to" She went from half tablet twice a day to 1 tablet twice a day and the diarrhea came back.  It is frequent, watery.  No blood on the stools that she can tell. Her appetite is somewhat decreased and she has lost weight. It is associated with lower abdominal cramps, reports that Bentyl that was given to her at the emergency room helped to some extent. She is feeling a little weak and cold. Denies taking any antibiotics in the last 2 months specifically no clindamycin which she has for dental procedures..  Past Medical History:  Diagnosis Date  . Aortic stenosis, severe    a. 04/2017: s/p TAVR wtih a Medtronic Evolut-Pro (size 78m, model # Evolutpro29US, serial # [id number]  . CAD (coronary artery disease)    a. pre-TAVR cath 01/2017 showed 80% LCX stenosis. No angina. Plan to  treat medically  . Enlarged thyroid   . GERD (gastroesophageal reflux disease)   . Hyperlipidemia   . OSA (obstructive sleep apnea)    no cpap   . Osteoarthritis of both knees   . Tinnitus     Past Surgical History:  Procedure Laterality Date  . BIOPSY THYROID  2015  . DILATION AND CURETTAGE OF UTERUS    . LEFT HEART CATH AND CORONARY ANGIOGRAPHY N/A 01/15/2017   Procedure: Left Heart Cath and Coronary Angiography;  Surgeon: CSherren Mocha MD;  [facility];  Service: Cardiovascular;  Laterality: N/A;  . TEE WITHOUT CARDIOVERSION N/A 04/23/2017   Procedure: TRANSESOPHAGEAL ECHOCARDIOGRAM (TEE);  Surgeon: CSherren Mocha MD;  [facility] [facility]  Service: Open Heart Surgery;  Laterality: N/A;  . TONSILLECTOMY  age 72 . TOTAL KNEE ARTHROPLASTY Right 12/13/2014   Procedure: RIGHT TOTAL KNEE ARTHROPLASTY;  Surgeon: FGaynelle Arabian MD;  Location: [facility];  Service: Orthopedics;  Laterality: Right;  . TOTAL KNEE ARTHROPLASTY Left 02/20/2016   Procedure: LEFT TOTAL KNEE ARTHROPLASTY;  Surgeon: FGaynelle Arabian MD;  Location: [facility];  Service: Orthopedics;  Laterality: Left;  . TRANSCATHETER AORTIC VALVE REPLACEMENT, TRANSFEMORAL N/A 04/23/2017   Procedure: TRANSCATHETER AORTIC VALVE REPLACEMENT, TRANSFEMORAL;  Surgeon: CSherren Mocha MD;  [place] [facility]  Service: Open Heart Surgery;  Laterality: N/A;  MEDTRONIC VALVE    Social History   Socioeconomic History  . Marital status: Married    Spouse name: Not on  file  . Number of children: 2  . Years of education: Not on file  . Highest education level: Not on file  Occupational History  . Occupation: self employed  Social Needs  . Financial resource strain: Not on file  . Food insecurity:    Worry: Not on file    Inability: Not on file  . Transportation needs:    Medical: Not on file    Non-medical: Not on file  Tobacco Use  . Smoking status: Former Smoker    Packs/day: 2.00    Years: 15.00    Pack years: 30.00     Types: Cigarettes    Last attempt to quit: 09/03/1968    Years since quitting: 50.0  . Smokeless tobacco: Never Used  . Tobacco comment: 1-2 ppd. , NONE IN 40 YEARS  Substance and Sexual Activity  . Alcohol use: No  . Drug use: No  . Sexual activity: Not on file  Lifestyle  . Physical activity:    Days per week: Not on file    Minutes per session: Not on file  . Stress: Not on file  Relationships  . Social connections:    Talks on phone: Not on file    Gets together: Not on file    Attends religious service: Not on file    Active member of club or organization: Not on file    Attends meetings of clubs or organizations: Not on file    Relationship status: Not on file  . Intimate partner violence:    Fear of current or ex partner: Not on file    Emotionally abused: Not on file    Physically abused: Not on file    Forced sexual activity: Not on file  Other Topics Concern  . Not on file  Social History Narrative   Household: pt and husband       Allergies as of 08/25/2018      Reactions   Advil [ibuprofen] Anaphylaxis, Swelling, Other (See Comments)   Throat swelling per patient   Penicillins Hives, Swelling, Other (See Comments)   Tongue swelling Has patient had a PCN reaction causing immediate rash, facial/tongue/throat swelling, SOB or lightheadedness with hypotension: yes Has patient had a PCN reaction causing severe rash involving mucus membranes or skin necrosis: no Has patient had a PCN reaction that required hospitalization no Has patient had a PCN reaction occurring within the last 10 years: no If all of the above answers are "NO", then may proceed with Cepha   Demerol [meperidine] Nausea And Vomiting   Other Hives, Other (See Comments)   Duck   Cortisone Anxiety, Other (See Comments)   Loopy       Medication List       Accurate as of August 25, 2018 11:59 PM. Always use your most recent med list.        aspirin EC 81 MG tablet Take 81 mg by mouth  every evening.   atorvastatin 80 MG tablet Commonly known as:  LIPITOR Take 1 tablet (80 mg total) by mouth at bedtime.   BIOTIN PO Take 1 tablet by mouth daily.   clindamycin 300 MG capsule Commonly known as:  CLEOCIN Take 2 tablets by mouth 60 minutes prior to dental appointments   dicyclomine 10 MG capsule Commonly known as:  BENTYL Take 1 capsule (10 mg total) by mouth 4 (four) times daily as needed for spasms.   glucose blood test strip Commonly known as:  ONETOUCH VERIO Check blood  sugars three times daily   metoprolol tartrate 25 MG tablet Commonly known as:  LOPRESSOR Take 1 tablet (25 mg total) by mouth 2 (two) times daily.   multivitamin with minerals Tabs tablet Take 1 tablet by mouth daily.   nitroGLYCERIN 0.4 MG SL tablet Commonly known as:  NITROSTAT Place 1 tablet (0.4 mg total) under the tongue every 5 (five) minutes as needed for chest pain.   ONE TOUCH LANCETS Misc Check blood sugar three times daily   ONETOUCH VERIO FLEX SYSTEM w/Device Kit Check blood sugars three times daily           Objective:   Physical Exam BP 126/80 (BP Location: Right Arm, Patient Position: Sitting, Cuff Size: Normal)   Pulse 81   Temp (!) 97.5 F (36.4 C) (Oral)   Resp 16   Ht _0  (1.549 m)   Wt 243 lb 6 oz (110.4 kg)   SpO2 96%   BMI 45.99 kg/m  General:   Well developed, NAD, BMI noted.  HEENT:  Normocephalic . Face symmetric, atraumatic Lungs:  CTA B Normal respiratory effort, no intercostal retractions, no accessory muscle use. Heart: RRR,  no murmur.  no pretibial edema bilaterally  Abdomen:  Not distended, soft, non-tender. No rebound or rigidity.   Skin: Not pale. Not jaundice Neurologic:  alert & oriented X3.  Speech normal, gait appropriate for age and unassisted Psych--  Cognition and judgment appear intact.  Cooperative with normal attention span and concentration.  Behavior appropriate. No anxious or depressed appearing.       Assessment     Assessment DM A1C 7.0 (04/2017) Hyperlipidemia GERD DJD OSA, dx few years ago was intolerant to CPAP, subsequently lost weight and snoring  essentially stopped. Morbid obesity CV: -CAD -Aortic stenosis, status post Ivar -Carotid artery disease Thyroid biopsy 2015: Nonneoplastic goiter.  Plan: Severe diarrhea: Most likely due to metformin, will stop it today, I asked her to let me know in 10 days how she is feeling. Bentyl as needed for cramps, Metamucil for few days to help with diarrhea. DM: Last A1c 7.9, forming Rx but intolerant, see above. I recommended to consider another medication in few days when she feels better but she likes to work harder on her lifestyle thus recommend self learning and will refer her to a nutritionist. She will start checking her CBGs at least 1 daily, goals provided.  CMP today. High cholesterol: Tolerating target dose of Lipitor.  Labs on RTC Pulmonary nodule, 5 mm, incidental finding on CT abdomen at [facility] 07/30/2018.  Consider follow-up if high risk. Thyroid nodule: Ultrasound 10-20 19, showed a stable, R, 2 cm nodule.  Recheck in 1 year Preventive care: Declined shot Patient education: Strongly recommend to contact her doctors if she has ongoing side effects from any medications RTC 2 months  Today, I spent more than 32  min with the patient: >50% of the time counseling regards her recent side effects from metformin, educating the patient, answered multiple questions from the patient and her husband.

## 2018-08-25 NOTE — Progress Notes (Signed)
Pre visit review using our clinic review tool, if applicable. No additional management support is needed unless otherwise documented below in the visit note. 

## 2018-08-25 NOTE — Patient Instructions (Addendum)
Next visit in 2 months, please make an appointment   ===  STOP METFORMIN  CALL IN 10 DAYS, LET ME KNOW HOW YOU ARE FEELING  KEEP HYDRATED   TAKE BENTYL FOR CRAMPS   METAMUCIL 2 CAPSULES FOR FEW DAYS TO HELP DIARRHEA   ===  If you ever suspect again they have a side effect from any medication, please  call your doctor ===  Diabetes: Check your blood sugar  once a day  at different times of the day  GOALS: Fasting before a meal 70- 130 2 hours after a meal less than 180   DIABETES self learn tools:  Get the book "Diabetes for Dummies" Get the book: "The [facility] Diabetes diet"   Get the book: "The Essential Diabetes Book" Online resources: The American diabetes Association     diabetes.[facility] website it is a Microbiologist  [place].org  The [facility] web site has a diabetes section  [employer]

## 2018-08-26 DIAGNOSIS — R911 Solitary pulmonary nodule: Secondary | ICD-10-CM | POA: Insufficient documentation

## 2018-08-26 LAB — COMPREHENSIVE METABOLIC PANEL
ALT: 19 U/L (ref 0–35)
AST: 14 U/L (ref 0–37)
Albumin: 4.1 g/dL (ref 3.5–5.2)
Alkaline Phosphatase: 121 U/L — ABNORMAL HIGH (ref 39–117)
BUN: 24 mg/dL — ABNORMAL HIGH (ref 6–23)
CO2: 29 mEq/L (ref 19–32)
CREATININE: 0.76 mg/dL (ref 0.40–1.20)
Calcium: 9.5 mg/dL (ref 8.4–10.5)
Chloride: 102 mEq/L (ref 96–112)
GFR: 79.17 mL/min (ref >60.00)
Glucose, Bld: 163 mg/dL — ABNORMAL HIGH (ref 70–99)
Potassium: 4.2 mEq/L (ref 3.5–5.1)
Sodium: 140 mEq/L (ref 135–145)
Total Bilirubin: 0.6 mg/dL (ref 0.2–1.2)
Total Protein: 6.7 g/dL (ref 6.0–8.3)

## 2018-08-26 IMAGING — CR DG CHEST 2V
2 series · 2 of 2 positions shown · non-contrast
Comparison: CT 03/19/2017 .

CLINICAL DATA: Aortic valve replacement.

EXAM:
CHEST  2 VIEW

[w chest pa]
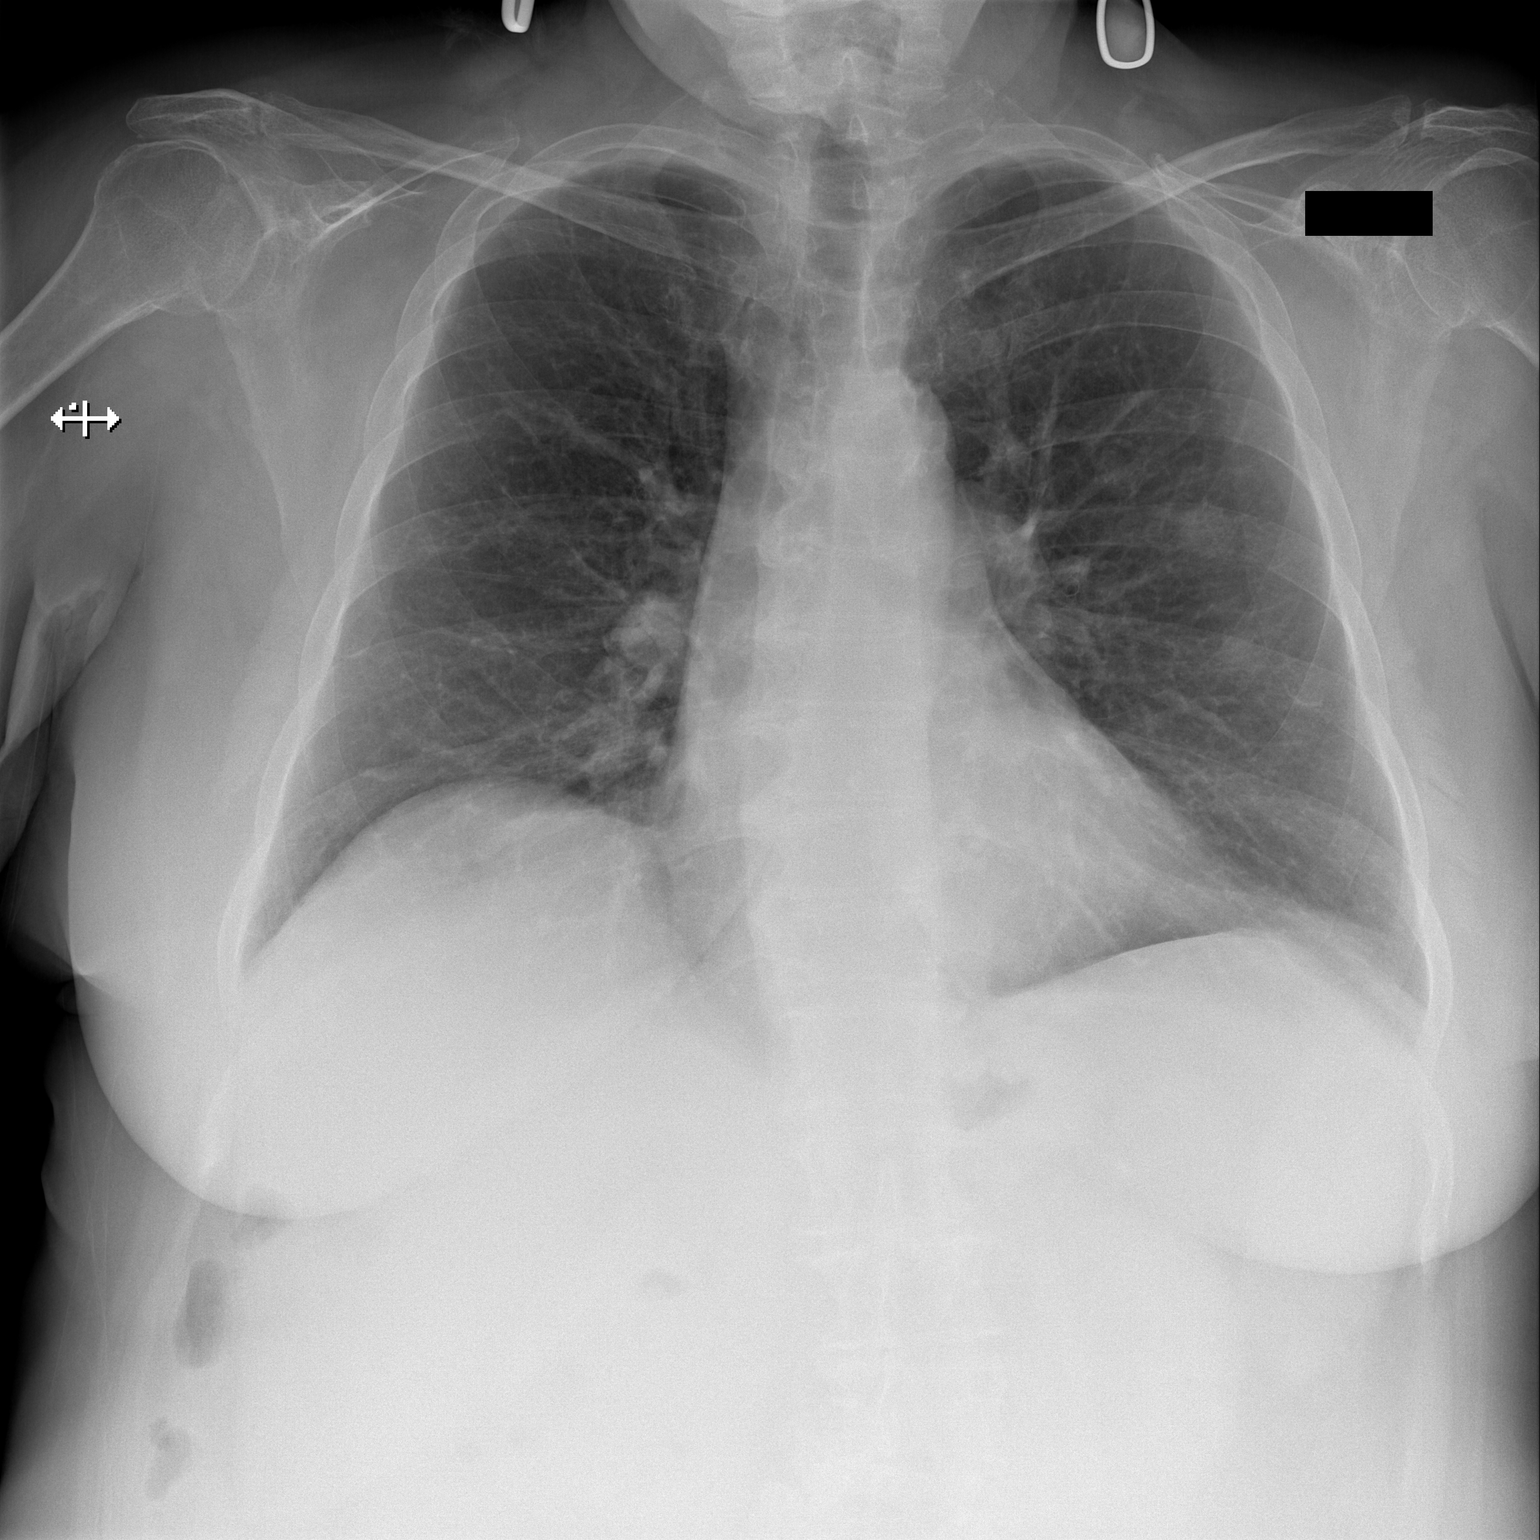

[w chest lat]
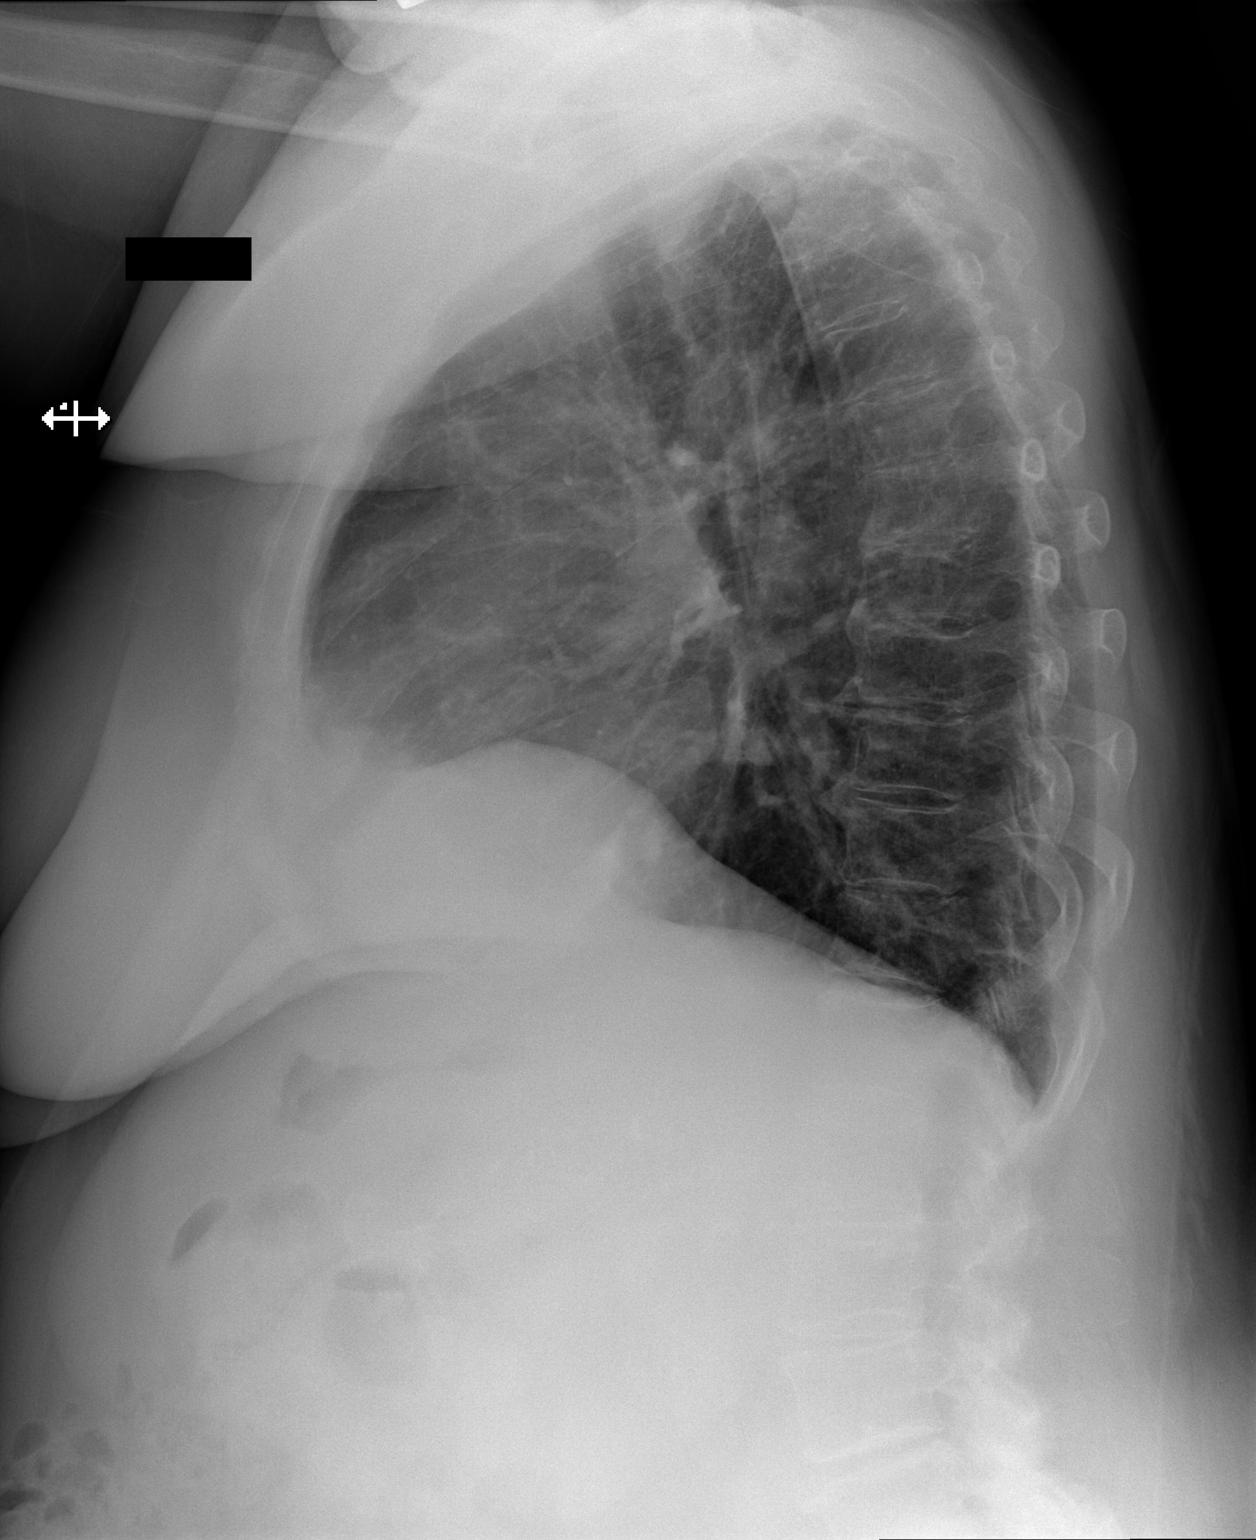

[2 of 2 positions shown; findings below may reference images not displayed]

FINDINGS: Mediastinum and hilar structures are normal. Heart size normal. No
focal infiltrate. No pleural effusion or pneumothorax. Left anterior
fourth and fifth old rib fractures are noted.
IMPRESSION: No acute cardiopulmonary disease.

## 2018-08-26 NOTE — Assessment & Plan Note (Signed)
Severe diarrhea: Most likely due to metformin, will stop it today, I asked her to let me know in 10 days how she is feeling. Bentyl as needed for cramps, Metamucil for few days to help with diarrhea. DM: Last A1c 7.9, forming Rx but intolerant, see above. I recommended to consider another medication in few days when she feels better but she likes to work harder on her lifestyle thus recommend self learning and will refer her to a nutritionist. She will start checking her CBGs at least 1 daily, goals provided.  CMP today. High cholesterol: Tolerating target dose of Lipitor.  Labs on RTC Pulmonary nodule, 5 mm, incidental finding on CT abdomen at [facility] 07/30/2018.  Consider follow-up if high risk. Thyroid nodule: Ultrasound 10-20 19, showed a stable, R, 2 cm nodule.  Recheck in 1 year Preventive care: Declined shot Patient education: Strongly recommend to contact her doctors if she has ongoing side effects from any medications RTC 2 months

## 2018-09-02 ENCOUNTER — Telehealth (HOSPITAL_COMMUNITY): Payer: Self-pay

## 2018-09-04 ENCOUNTER — Ambulatory Visit (HOSPITAL_COMMUNITY): Payer: Medicare Other | Attending: Cardiovascular Disease

## 2018-09-04 DIAGNOSIS — I447 Left bundle-branch block, unspecified: Secondary | ICD-10-CM | POA: Insufficient documentation

## 2018-09-04 DIAGNOSIS — I2510 Atherosclerotic heart disease of native coronary artery without angina pectoris: Secondary | ICD-10-CM | POA: Diagnosis not present

## 2018-09-04 DIAGNOSIS — R079 Chest pain, unspecified: Secondary | ICD-10-CM | POA: Diagnosis not present

## 2018-09-04 DIAGNOSIS — I10 Essential (primary) hypertension: Secondary | ICD-10-CM | POA: Diagnosis not present

## 2018-09-04 DIAGNOSIS — Z952 Presence of prosthetic heart valve: Secondary | ICD-10-CM | POA: Insufficient documentation

## 2018-09-04 MED ORDER — TECHNETIUM TC 99M TETROFOSMIN IV KIT
32.1000 | PACK | Freq: Once | INTRAVENOUS | Status: AC | PRN
  Administered 2018-09-04: 32.1 via INTRAVENOUS
  Filled 2018-09-04: qty 33

## 2018-09-04 MED ORDER — REGADENOSON 0.4 MG/5ML IV SOLN
0.4000 mg | Freq: Once | INTRAVENOUS | Status: AC
  Administered 2018-09-04: 0.4 mg via INTRAVENOUS

## 2018-09-05 ENCOUNTER — Ambulatory Visit (HOSPITAL_COMMUNITY): Payer: Medicare Other | Attending: Cardiology

## 2018-09-05 LAB — MYOCARDIAL PERFUSION IMAGING
LV dias vol: 52 mL (ref 46–106)
LV sys vol: 14 mL
Peak HR: 81 {beats}/min
Rest HR: 63 {beats}/min
SDS: 3
SRS: 1
SSS: 4
TID: 1.18

## 2018-09-05 MED ORDER — TECHNETIUM TC 99M TETROFOSMIN IV KIT
29.3000 | PACK | Freq: Once | INTRAVENOUS | Status: AC | PRN
  Administered 2018-09-05: 29.3 via INTRAVENOUS
  Filled 2018-09-05: qty 30

## 2018-09-18 ENCOUNTER — Encounter (HOSPITAL_COMMUNITY): Payer: Self-pay

## 2018-10-28 ENCOUNTER — Encounter: Payer: Self-pay | Admitting: Internal Medicine

## 2018-10-28 ENCOUNTER — Ambulatory Visit (INDEPENDENT_AMBULATORY_CARE_PROVIDER_SITE_OTHER): Payer: Medicare Other | Admitting: Internal Medicine

## 2018-10-28 VITALS — BP 132/80 | HR 69 | Temp 98.0°F | Resp 16 | Ht 61.0 in | Wt 236.1 lb

## 2018-10-28 DIAGNOSIS — E119 Type 2 diabetes mellitus without complications: Secondary | ICD-10-CM

## 2018-10-28 DIAGNOSIS — E1159 Type 2 diabetes mellitus with other circulatory complications: Secondary | ICD-10-CM | POA: Diagnosis not present

## 2018-10-28 DIAGNOSIS — I10 Essential (primary) hypertension: Secondary | ICD-10-CM | POA: Diagnosis not present

## 2018-10-28 DIAGNOSIS — I2510 Atherosclerotic heart disease of native coronary artery without angina pectoris: Secondary | ICD-10-CM | POA: Diagnosis not present

## 2018-10-28 DIAGNOSIS — Z0100 Encounter for examination of eyes and vision without abnormal findings: Secondary | ICD-10-CM | POA: Diagnosis not present

## 2018-10-28 DIAGNOSIS — E7849 Other hyperlipidemia: Secondary | ICD-10-CM | POA: Diagnosis not present

## 2018-10-28 NOTE — Progress Notes (Signed)
Pre visit review using our clinic review tool, if applicable. No additional management support is needed unless otherwise documented below in the visit note. 

## 2018-10-28 NOTE — Assessment & Plan Note (Signed)
DM: Since her last office visit, she changed her diet, ambulatory CBGs are very good, always less than 135.  We will check a A1c and micro.  Refer to Dr. Bing Plume for an eye exam. Discussed her A1c goal of 6.5. Also, discussed the fact that now there are medications with the potential of not only decrease her sugar but protect her heart (SGLT2 inhibitors). States she would still be reluctant to take medication Hyperlipidemia: Based on last FLP, Lipitor dose increased, checking FLP Increased LFTs: Alkaline phosphatase has been minimally elevated twice, checking LFTs and GGT. Preventive care: CCS: 3 modalities discussed.  Declines screening, benefits discussed. Hep C screening: Declined it Bone density test: Declined it, explained benefits of treating osteopenia/osteoporosis. Mammogram offered: Declined RTC 3 months

## 2018-10-28 NOTE — Progress Notes (Signed)
Subjective:    Patient ID: Debra Knight, female    DOB: [birth year], 73 y.o.   MRN: [id number]  DOS:  10/28/2018 Type of visit - description: Follow-up, here with her husband Since her last visit, diet has improved significantly, she has decreased carbohydrate intake. Ambulatory CBGs range from 109 -135.   Review of Systems Denies chest pain, difficulty breathing No lower extremity edema  Past Medical History:  Diagnosis Date  . Aortic stenosis, severe    a. 04/2017: s/p TAVR wtih a Medtronic Evolut-Pro (size 4m, model # Evolutpro29US, serial # [id number]  . CAD (coronary artery disease)    a. pre-TAVR cath 01/2017 showed 80% LCX stenosis. No angina. Plan to treat medically  . Enlarged thyroid   . GERD (gastroesophageal reflux disease)   . Hyperlipidemia   . OSA (obstructive sleep apnea)    no cpap   . Osteoarthritis of both knees   . Tinnitus     Past Surgical History:  Procedure Laterality Date  . BIOPSY THYROID  2015  . DILATION AND CURETTAGE OF UTERUS    . LEFT HEART CATH AND CORONARY ANGIOGRAPHY N/A 01/15/2017   Procedure: Left Heart Cath and Coronary Angiography;  Surgeon: CSherren Mocha MD;  [facility];  Service: Cardiovascular;  Laterality: N/A;  . TEE WITHOUT CARDIOVERSION N/A 04/23/2017   Procedure: TRANSESOPHAGEAL ECHOCARDIOGRAM (TEE);  Surgeon: CSherren Mocha MD;  [place] [facility]  Service: Open Heart Surgery;  Laterality: N/A;  . TONSILLECTOMY  age 73 . TOTAL KNEE ARTHROPLASTY Right 12/13/2014   Procedure: RIGHT TOTAL KNEE ARTHROPLASTY;  Surgeon: FGaynelle Arabian MD;  [facility];  Service: Orthopedics;  Laterality: Right;  . TOTAL KNEE ARTHROPLASTY Left 02/20/2016   Procedure: LEFT TOTAL KNEE ARTHROPLASTY;  Surgeon: FGaynelle Arabian MD;  [facility];  Service: Orthopedics;  Laterality: Left;  . TRANSCATHETER AORTIC VALVE REPLACEMENT, TRANSFEMORAL N/A 04/23/2017   Procedure: TRANSCATHETER AORTIC VALVE REPLACEMENT, TRANSFEMORAL;   Surgeon: CSherren Mocha MD;  [facility]  Service: Open Heart Surgery;  Laterality: N/A;  MEDTRONIC VALVE    Social History   Socioeconomic History  . Marital status: Married    Spouse name: Not on file  . Number of children: 2  . Years of education: Not on file  . Highest education level: Not on file  Occupational History  . Occupation: self employed  Social Needs  . Financial resource strain: Not on file  . Food insecurity:    Worry: Not on file    Inability: Not on file  . Transportation needs:    Medical: Not on file    Non-medical: Not on file  Tobacco Use  . Smoking status: Former Smoker    Packs/day: 2.00    Years: 15.00    Pack years: 30.00    Types: Cigarettes    Last attempt to quit: 09/03/1968    Years since quitting: 50.1  . Smokeless tobacco: Never Used  . Tobacco comment: 1-2 ppd. , NONE IN 40 YEARS  Substance and Sexual Activity  . Alcohol use: No  . Drug use: No  . Sexual activity: Not on file  Lifestyle  . Physical activity:    Days per week: Not on file    Minutes per session: Not on file  . Stress: Not on file  Relationships  . Social connections:    Talks on phone: Not on file    Gets together: Not on file    Attends religious service: Not on file  Active member of club or organization: Not on file    Attends meetings of clubs or organizations: Not on file    Relationship status: Not on file  . Intimate partner violence:    Fear of current or ex partner: Not on file    Emotionally abused: Not on file    Physically abused: Not on file    Forced sexual activity: Not on file  Other Topics Concern  . Not on file  Social History Narrative   Household: pt and husband       Allergies as of 10/28/2018      Reactions   Advil [ibuprofen] Anaphylaxis, Swelling, Other (See Comments)   Throat swelling per patient   Penicillins Hives, Swelling, Other (See Comments)   Tongue swelling Has patient had a PCN reaction causing immediate rash,  facial/tongue/throat swelling, SOB or lightheadedness with hypotension: yes Has patient had a PCN reaction causing severe rash involving mucus membranes or skin necrosis: no Has patient had a PCN reaction that required hospitalization no Has patient had a PCN reaction occurring within the last 10 years: no If all of the above answers are "NO", then may proceed with Cepha   Demerol [meperidine] Nausea And Vomiting   Other Hives, Other (See Comments)   Duck   Cortisone Anxiety, Other (See Comments)   Loopy       Medication List       Accurate as of October 28, 2018  6:11 PM. Always use your most recent med list.        aspirin EC 81 MG tablet Take 81 mg by mouth every evening.   atorvastatin 80 MG tablet Commonly known as:  LIPITOR Take 1 tablet (80 mg total) by mouth at bedtime.   BIOTIN PO Take 1 tablet by mouth daily.   clindamycin 300 MG capsule Commonly known as:  CLEOCIN Take 2 tablets by mouth 60 minutes prior to dental appointments   dicyclomine 10 MG capsule Commonly known as:  BENTYL Take 1 capsule (10 mg total) by mouth 4 (four) times daily as needed for spasms.   glucose blood test strip Commonly known as:  ONETOUCH VERIO Check blood sugars three times daily   metoprolol tartrate 25 MG tablet Commonly known as:  LOPRESSOR Take 1 tablet (25 mg total) by mouth 2 (two) times daily.   multivitamin with minerals Tabs tablet Take 1 tablet by mouth daily.   nitroGLYCERIN 0.4 MG SL tablet Commonly known as:  NITROSTAT Place 1 tablet (0.4 mg total) under the tongue every 5 (five) minutes as needed for chest pain.   ONE TOUCH LANCETS Misc Check blood sugar three times daily   ONETOUCH VERIO FLEX SYSTEM w/Device Kit Check blood sugars three times daily           Objective:   Physical Exam BP 132/80 (BP Location: Left Arm, Patient Position: Sitting, Cuff Size: Normal)   Pulse 69   Temp 98 F (36.7 C) (Oral)   Resp 16   Ht 5' 1"  (1.549 m)   Wt 236  lb 2 oz (107.1 kg)   SpO2 98%   BMI 44.62 kg/m  General:   Well developed, NAD, BMI noted. HEENT:  Normocephalic . Face symmetric, atraumatic Lungs:  CTA B Normal respiratory effort, no intercostal retractions, no accessory muscle use. Heart: RRR,  no murmur.  No pretibial edema bilaterally  Diabetic foot exam: No edema, good pulses, pinprick examination normal Neurologic:  alert & oriented X3.  Speech normal, gait  appropriate for age and unassisted Psych--  Cognition and judgment appear intact.  Cooperative with normal attention span and concentration.  Behavior appropriate. No anxious or depressed appearing.      Assessment      Assessment DMA1C 7.0 (04/2017) Hyperlipidemia GERD DJD OSA, dx few years ago was intolerant to CPAP, subsequently lost weight and snoring  essentially stopped. Morbid obesity CV: -CAD -Aortic stenosis, status post Ivar -Carotid artery disease Thyroid biopsy 2015: Nonneoplastic goiter.  Plan: DM: Since her last office visit, she changed her diet, ambulatory CBGs are very good, always less than 135.  We will check a A1c and micro.  Refer to Dr. Bing Plume for an eye exam. Discussed her A1c goal of 6.5. Also, discussed the fact that now there are medications with the potential of not only decrease her sugar but protect her heart (SGLT2 inhibitors). States she would still be reluctant to take medication Hyperlipidemia: Based on last FLP, Lipitor dose increased, checking FLP Increased LFTs: Alkaline phosphatase has been minimally elevated twice, checking LFTs and GGT. Preventive care: CCS: 3 modalities discussed.  Declines screening, benefits discussed. Hep C screening: Declined it Bone density test: Declined it, explained benefits of treating osteopenia/osteoporosis. Mammogram offered: Declined RTC 3 months F2F > 20 min

## 2018-10-28 NOTE — Patient Instructions (Addendum)
Please schedule Medicare Wellness with Glenard Haring.     GO TO THE FRONT DESK Schedule your next appointment   for a checkup in 3 months  Schedule labs to be done this week, fasting

## 2018-10-29 ENCOUNTER — Other Ambulatory Visit: Payer: Self-pay

## 2018-10-30 ENCOUNTER — Other Ambulatory Visit (INDEPENDENT_AMBULATORY_CARE_PROVIDER_SITE_OTHER): Payer: Medicare Other

## 2018-10-30 DIAGNOSIS — E1159 Type 2 diabetes mellitus with other circulatory complications: Secondary | ICD-10-CM

## 2018-10-30 DIAGNOSIS — E7849 Other hyperlipidemia: Secondary | ICD-10-CM | POA: Diagnosis not present

## 2018-10-31 LAB — LIPID PANEL
Cholesterol: 168 mg/dL (ref 0–200)
HDL: 38.1 mg/dL — ABNORMAL LOW (ref >39.00)
LDL Cholesterol: 96 mg/dL (ref 0–99)
NonHDL: 130.25
Total CHOL/HDL Ratio: 4
Triglycerides: 173 mg/dL — ABNORMAL HIGH (ref 0.0–149.0)
VLDL: 34.6 mg/dL (ref 0.0–40.0)

## 2018-10-31 LAB — MICROALBUMIN / CREATININE URINE RATIO
Creatinine,U: 126.3 mg/dL
Microalb Creat Ratio: 0.9 mg/g (ref 0.0–30.0)
Microalb, Ur: 1.1 mg/dL (ref 0.0–1.9)

## 2018-10-31 LAB — GAMMA GT: GGT: 45 U/L (ref 7–51)

## 2018-10-31 LAB — HEPATIC FUNCTION PANEL
ALT: 21 U/L (ref 0–35)
AST: 17 U/L (ref 0–37)
Albumin: 4.1 g/dL (ref 3.5–5.2)
Alkaline Phosphatase: 141 U/L — ABNORMAL HIGH (ref 39–117)
Bilirubin, Direct: 0.2 mg/dL (ref 0.0–0.3)
Total Bilirubin: 0.8 mg/dL (ref 0.2–1.2)
Total Protein: 6.6 g/dL (ref 6.0–8.3)

## 2018-10-31 LAB — HEMOGLOBIN A1C: HEMOGLOBIN A1C: 6.9 % — AB (ref 4.6–6.5)

## 2018-11-04 ENCOUNTER — Encounter: Payer: Self-pay | Admitting: Internal Medicine

## 2018-11-04 ENCOUNTER — Telehealth: Payer: Self-pay | Admitting: Internal Medicine

## 2018-11-04 DIAGNOSIS — E119 Type 2 diabetes mellitus without complications: Secondary | ICD-10-CM | POA: Diagnosis not present

## 2018-11-04 DIAGNOSIS — H10413 Chronic giant papillary conjunctivitis, bilateral: Secondary | ICD-10-CM | POA: Diagnosis not present

## 2018-11-04 DIAGNOSIS — H2513 Age-related nuclear cataract, bilateral: Secondary | ICD-10-CM | POA: Diagnosis not present

## 2018-11-04 LAB — HM DIABETES EYE EXAM

## 2018-11-04 NOTE — Telephone Encounter (Signed)
Please advise 

## 2018-11-04 NOTE — Telephone Encounter (Signed)
Please see note- looked up labs but did not see result note

## 2018-11-04 NOTE — Telephone Encounter (Signed)
Left a detailed message.  Will also send a message.  See labs

## 2018-11-04 NOTE — Telephone Encounter (Signed)
Copied from [place] [id number]. Topic: Quick Communication - See Telephone Encounter >> Nov 04, 2018 12:22 PM Sheran Luz wrote: CRM for notification. See Telephone encounter for: 11/04/18.  Patient calling to check status of lab results from 2/27. Please advise.

## 2019-01-25 ENCOUNTER — Other Ambulatory Visit: Payer: Self-pay | Admitting: Internal Medicine

## 2019-03-10 ENCOUNTER — Other Ambulatory Visit: Payer: Self-pay | Admitting: Internal Medicine

## 2019-03-11 ENCOUNTER — Other Ambulatory Visit: Payer: Self-pay | Admitting: Cardiovascular Disease

## 2019-03-11 MED ORDER — ATORVASTATIN CALCIUM 80 MG PO TABS: 80.0000 mg | ORAL_TABLET | Freq: Every day | ORAL | 1 refills | Status: DC

## 2019-03-11 NOTE — Telephone Encounter (Signed)
Pt's medication was sent to pt's pharmacy as requested. Confirmation received.  °

## 2019-05-08 ENCOUNTER — Other Ambulatory Visit: Payer: Self-pay | Admitting: Cardiovascular Disease

## 2019-05-29 DIAGNOSIS — Z23 Encounter for immunization: Secondary | ICD-10-CM | POA: Diagnosis not present

## 2019-06-10 ENCOUNTER — Encounter: Payer: Self-pay | Admitting: Cardiovascular Disease

## 2019-06-10 NOTE — Telephone Encounter (Signed)
Error. No note needed  

## 2019-06-22 NOTE — Progress Notes (Signed)
Cardiology Office Note    Date:  06/24/2019   ID:  Debra Knight, DOB July 23, 1946, MRN [id number]  PCP:  Colon Branch, MD  Cardiologist: Dr. Johnsie Cancel / Dr. Burt Knack (TAVR)  CC: TAVR/ CAD   History of Present Illness:  Debra Knight is a 73 y.o. female with a history of CAD, HLD, obesity, DJD and severe aortic stenosis s/p TAVR (04/23/17) with Medtronic 29 mm CoreValve Evolut Pro. This was done with general anesthesia and right TF approach. There was no PVL Cath prior to valve did show severe proximal circumflex disease 80% This has been Rx medically. She was thought to be a poor Open surgical candidate due to need for aortic root enlargement poor functional status . She has not had angina She had a normal Non ischemic myovue 08/01/17  Last TTE reviewed 04/23/18 EF 60-65% stable mean gradient 10 mmhg peak 21 mmHg and also  A small dynamic obstruction in mid cavity. No PVL.    She was very upset about her DM. Dr Larose Kells started her on glucophage and it has mad her sick " he did not tell me about side effects"  Seen in ER and she held it for a while. Needs alternative Rx needs new primary and DM doctor Husband Debra Knight also a patient of mine had repeat cervical surgery at [facility] with some improvement in UE weakness    Past Medical History:  Diagnosis Date  . Aortic stenosis, severe    a. 04/2017: s/p TAVR wtih a Medtronic Evolut-Pro (size 41m, model # Evolutpro29US, serial # [id number]  . CAD (coronary artery disease)    a. pre-TAVR cath 01/2017 showed 80% LCX stenosis. No angina. Plan to treat medically  . Enlarged thyroid   . GERD (gastroesophageal reflux disease)   . Hyperlipidemia   . OSA (obstructive sleep apnea)    no cpap   . Osteoarthritis of both knees   . Tinnitus     Past Surgical History:  Procedure Laterality Date  . BIOPSY THYROID  2015  . DILATION AND CURETTAGE OF UTERUS    . LEFT HEART CATH AND CORONARY ANGIOGRAPHY N/A 01/15/2017   Procedure: Left Heart Cath and Coronary  Angiography;  Surgeon: CSherren Mocha MD;  [facility];  Service: Cardiovascular;  Laterality: N/A;  . TEE WITHOUT CARDIOVERSION N/A 04/23/2017   Procedure: TRANSESOPHAGEAL ECHOCARDIOGRAM (TEE);  Surgeon: CSherren Mocha MD;  [place] [facility]  Service: Open Heart Surgery;  Laterality: N/A;  . TONSILLECTOMY  age 73 . TOTAL KNEE ARTHROPLASTY Right 12/13/2014   Procedure: RIGHT TOTAL KNEE ARTHROPLASTY;  Surgeon: FGaynelle Arabian MD;  [facility];  Service: Orthopedics;  Laterality: Right;  . TOTAL KNEE ARTHROPLASTY Left 02/20/2016   Procedure: LEFT TOTAL KNEE ARTHROPLASTY;  Surgeon: FGaynelle Arabian MD;  [facility];  Service: Orthopedics;  Laterality: Left;  . TRANSCATHETER AORTIC VALVE REPLACEMENT, TRANSFEMORAL N/A 04/23/2017   Procedure: TRANSCATHETER AORTIC VALVE REPLACEMENT, TRANSFEMORAL;  Surgeon: CSherren Mocha MD;  [place] [facility]  Service: Open Heart Surgery;  Laterality: N/A;  MEDTRONIC VALVE    Current Medications: Outpatient Medications Prior to Visit  Medication Sig Dispense Refill  . aspirin EC 81 MG tablet Take 81 mg by mouth every evening.    .Marland Kitchenatorvastatin (LIPITOR) 80 MG tablet Take 1 tablet (80 mg total) by mouth at bedtime. 90 tablet 1  . BIOTIN PO Take 1 tablet by mouth daily.    . Blood Glucose Monitoring Suppl (OHope w/Device  KIT Check blood sugars three times daily 1 kit 0  . clindamycin (CLEOCIN) 300 MG capsule Take 2 tablets by mouth 60 minutes prior to dental appointments    . dicyclomine (BENTYL) 10 MG capsule Take 1 capsule (10 mg total) by mouth 4 (four) times daily as needed for spasms. 40 capsule 0  . glucose blood (ONETOUCH VERIO) test strip Check blood sugars three times daily 300 each 12  . metoprolol tartrate (LOPRESSOR) 25 MG tablet Take 1 tablet by mouth twice daily 180 tablet 0  . Multiple Vitamin (MULTIVITAMIN WITH MINERALS) TABS tablet Take 1 tablet by mouth daily.    . ONE TOUCH LANCETS MISC Check blood  sugar three times daily 300 each 12  . nitroGLYCERIN (NITROSTAT) 0.4 MG SL tablet Place 1 tablet (0.4 mg total) under the tongue every 5 (five) minutes as needed for chest pain. (Patient not taking: Reported on 06/03/2018) 25 tablet 3   No facility-administered medications prior to visit.      Allergies:   Advil [ibuprofen], Penicillins, Demerol [meperidine], Other, and Cortisone   Social History   Socioeconomic History  . Marital status: Married    Spouse name: Not on file  . Number of children: 2  . Years of education: Not on file  . Highest education level: Not on file  Occupational History  . Occupation: self employed  Social Needs  . Financial resource strain: Not on file  . Food insecurity    Worry: Not on file    Inability: Not on file  . Transportation needs    Medical: Not on file    Non-medical: Not on file  Tobacco Use  . Smoking status: Former Smoker    Packs/day: 2.00    Years: 15.00    Pack years: 30.00    Types: Cigarettes    Quit date: 09/03/1968    Years since quitting: 50.8  . Smokeless tobacco: Never Used  . Tobacco comment: 1-2 ppd. , NONE IN 40 YEARS  Substance and Sexual Activity  . Alcohol use: No  . Drug use: No  . Sexual activity: Not on file  Lifestyle  . Physical activity    Days per week: Not on file    Minutes per session: Not on file  . Stress: Not on file  Relationships  . Social Herbalist on phone: Not on file    Gets together: Not on file    Attends religious service: Not on file    Active member of club or organization: Not on file    Attends meetings of clubs or organizations: Not on file    Relationship status: Not on file  Other Topics Concern  . Not on file  Social History Narrative   Household: pt and husband      Family History:  The patient's family history includes Diabetes in her maternal grandfather and maternal uncle; Hashimoto's thyroiditis in her daughter and son; Heart attack (age of onset: 39) in her  mother; Heart disease in her mother; Prostate cancer in her father.      ROS:   Please see the history of present illness.    ROS All other systems reviewed and are negative.   PHYSICAL EXAM:   VS:  BP 122/64   Pulse 65   Ht _0  (1.549 m)   Wt 247 lb (112 kg)   SpO2 98%   BMI 46.67 kg/m    Affect appropriate Obese white female  HEENT: normal Neck supple  with no adenopathy JVP normal no bruits no thyromegaly Lungs clear with no wheezing and good diaphragmatic motion Heart:  S1/S2 SEM  murmur, no rub, gallop or click PMI normal Abdomen: benighn, BS positve, no tenderness, no AAA no bruit.  No HSM or HJR Distal pulses intact with no bruits No edema Neuro non-focal Skin warm and dry Post bilateral TKR     Wt Readings from Last 3 Encounters:  06/24/19 247 lb (112 kg)  10/28/18 236 lb 2 oz (107.1 kg)  09/04/18 243 lb (110.2 kg)      Studies/Labs Reviewed:   EKG:  EKG is NOT ordered today.  Recent Labs: 08/25/2018: BUN 24; Creatinine, Ser 0.76; Potassium 4.2; Sodium 140 10/30/2018: ALT 21   Lipid Panel    Component Value Date/Time   CHOL 168 10/30/2018 1406   TRIG 173.0 (H) 10/30/2018 1406   HDL 38.10 (L) 10/30/2018 1406   CHOLHDL 4 10/30/2018 1406   VLDL 34.6 10/30/2018 1406   LDLCALC 96 10/30/2018 1406   LDLDIRECT 120.0 06/03/2018 1411    Additional studies/ records that were reviewed today include:  04/23/17 Procedure:  Transcatheter Aortic Valve Replacement - Percutaneous RightTransfemoral Approach Medtronic Evolut-Pro (size 57m, model # Evolutpro29US, serial # [id number]  Co-Surgeons:Bryan KAlveria Apley MD and MSherren Mocha MD   Anesthesiologist:Terry MOrene Desanctis MD  EDala Dock MD  Pre-operative Echo Findings: ? Severe aortic stenosis and moderate AI ? Normalleft ventricular systolic function  Post-operative Echo Findings: ? trivialparavalvular  leak ? Normalleft ventricular systolic function ? Normal prosthetic transvalvular gradient of 861mHg.  _____________  Echo 04/23/18 Study Conclusions  - Left ventricle: The cavity size was normal. Wall thickness was   increased in a pattern of moderate LVH. Systolic function was   normal. The estimated ejection fraction was in the range of 60%   to 65%. There was dynamic obstruction at rest in the mid cavity,   with a peak velocity of 240 cm/sec and a peak gradient of 23 mm   Hg. The gradient increased to 312 cm/s with Valsalva. Wall motion   was normal; there were no regional wall motion abnormalities.   Doppler parameters are consistent with abnormal left ventricular   relaxation (grade 1 diastolic dysfunction). Doppler parameters   are consistent with high ventricular filling pressure. - Aortic valve: A Resolut provalve 2946mAVR bioprosthesis was   present and functioning properly. Transvalvular velocity was   within the normal range. There was no stenosis. There was no   regurgitation. Peak velocity (S): 227 cm/s. Mean gradient (S): 10   mm Hg. Peak gradient (S): 21 mm Hg. - Mitral valve: Transvalvular velocity was within the normal range.   There was no evidence for stenosis. There was no regurgitation. - Left atrium: The atrium was mildly dilated. - Right ventricle: The cavity size was normal. Wall thickness was   normal. Systolic function was normal. - Atrial septum: No defect or patent foramen ovale was identified   by color flow Doppler. - Tricuspid valve: There was trivial regurgitation. - Pulmonary arteries: Systolic pressure was within the normal   range. PA peak pressure: 29 mm Hg (S).    ASSESSMENT & PLAN:   Severe AS s/p TAVR:  NHYA class 1. Post Evolut Pro 29 mm Medtronic CoreValve stable gradients no PVL f/u TTE ordered   HTN: Well controlled.  Continue current medications and low sodium Dash type diet.    HLD: continue statin   CAD: continue asa,  statin and BB circumflex lesion  is very tight no angina and normal myovue 09/05/18 observe   LBBB: we discussed how this is common after TAVR no high grade AV block Lexiscan for stress testing   DM:  Intolerant to Glucophage with severe GI distress f/u Dr Larose Kells for alternative   Jenkins Rouge

## 2019-06-24 ENCOUNTER — Ambulatory Visit (INDEPENDENT_AMBULATORY_CARE_PROVIDER_SITE_OTHER): Payer: Medicare Other | Admitting: Cardiovascular Disease

## 2019-06-24 ENCOUNTER — Other Ambulatory Visit: Payer: Self-pay

## 2019-06-24 ENCOUNTER — Encounter: Payer: Self-pay | Admitting: Cardiovascular Disease

## 2019-06-24 VITALS — BP 122/64 | HR 65 | Ht 61.0 in | Wt 247.0 lb

## 2019-06-24 DIAGNOSIS — E119 Type 2 diabetes mellitus without complications: Secondary | ICD-10-CM

## 2019-06-24 DIAGNOSIS — I2510 Atherosclerotic heart disease of native coronary artery without angina pectoris: Secondary | ICD-10-CM

## 2019-06-24 DIAGNOSIS — I350 Nonrheumatic aortic (valve) stenosis: Secondary | ICD-10-CM

## 2019-06-24 DIAGNOSIS — Z952 Presence of prosthetic heart valve: Secondary | ICD-10-CM

## 2019-06-24 MED ORDER — ATORVASTATIN CALCIUM 80 MG PO TABS: 80.0000 mg | ORAL_TABLET | Freq: Every day | ORAL | 3 refills | Status: DC

## 2019-06-24 NOTE — Patient Instructions (Addendum)
Medication Instructions:   *If you need a refill on your cardiac medications before your next appointment, please call your pharmacy*  Lab Work:  If you have labs (blood work) drawn today and your tests are completely normal, you will receive your results only by: Marland Kitchen MyChart Message (if you have MyChart) OR . A paper copy in the mail If you have any lab test that is abnormal or we need to change your treatment, we will call you to review the results.  Testing/Procedures: Your physician has requested that you have an echocardiogram. Echocardiography is a painless test that uses sound waves to create images of your heart. It provides your doctor with information about the size and shape of your heart and how well your heart's chambers and valves are working. This procedure takes approximately one hour. There are no restrictions for this procedure.  Follow-Up: At [facility], you and your health needs are our priority.  As part of our continuing mission to provide you with exceptional heart care, we have created designated Provider Care Teams.  These Care Teams include your primary Cardiologist (physician) and Advanced Practice Providers (APPs -  Physician Assistants and Nurse Practitioners) who all work together to provide you with the care you need, when you need it.  Your next appointment:   6 months  The format for your next appointment:   In Person  Provider:   You may see Jenkins Rouge, MD or one of the following Advanced Practice Providers on your designated Care Team:    Truitt Merle, NP  Cecilie Kicks, NP  Kathyrn Drown, NP  You have been referred to Endocrinology with Dr. Cruzita Lederer.

## 2019-07-09 ENCOUNTER — Ambulatory Visit (HOSPITAL_COMMUNITY): Payer: Medicare Other | Attending: Cardiology

## 2019-07-09 ENCOUNTER — Other Ambulatory Visit: Payer: Self-pay

## 2019-07-09 DIAGNOSIS — I350 Nonrheumatic aortic (valve) stenosis: Secondary | ICD-10-CM | POA: Diagnosis not present

## 2019-07-09 DIAGNOSIS — Z952 Presence of prosthetic heart valve: Secondary | ICD-10-CM | POA: Insufficient documentation

## 2019-07-14 ENCOUNTER — Ambulatory Visit (INDEPENDENT_AMBULATORY_CARE_PROVIDER_SITE_OTHER): Payer: Medicare Other | Admitting: Internal Medicine

## 2019-07-14 ENCOUNTER — Other Ambulatory Visit: Payer: Self-pay | Admitting: Internal Medicine

## 2019-07-14 ENCOUNTER — Other Ambulatory Visit: Payer: Self-pay

## 2019-07-14 ENCOUNTER — Encounter: Payer: Self-pay | Admitting: Internal Medicine

## 2019-07-14 VITALS — BP 142/80 | HR 64 | Ht 61.0 in | Wt 246.0 lb

## 2019-07-14 DIAGNOSIS — I2510 Atherosclerotic heart disease of native coronary artery without angina pectoris: Secondary | ICD-10-CM

## 2019-07-14 DIAGNOSIS — E1165 Type 2 diabetes mellitus with hyperglycemia: Secondary | ICD-10-CM

## 2019-07-14 DIAGNOSIS — E1159 Type 2 diabetes mellitus with other circulatory complications: Secondary | ICD-10-CM

## 2019-07-14 MED ORDER — FARXIGA 5 MG PO TABS: 5.0000 mg | ORAL_TABLET | Freq: Every day | ORAL | 5 refills | Status: DC

## 2019-07-14 NOTE — Progress Notes (Signed)
Patient ID: Debra Knight, female   DOB: May 25, 1946, 73 y.o.   MRN: [id number]  HPI: Debra Knight is a 73 y.o.-year-old female, referred by her cardiologist, Dr. Johnsie Cancel, for management of DM2, dx in 2018, non-insulin-dependent, uncontrolled, with complications (CAD, carotid artery disease, severe aortic stenosis).  Reviewed latest HbA1c level: Lab Results  Component Value Date   HGBA1C 6.9 (H) 10/30/2018   HGBA1C 7.9 (H) 06/03/2018   HGBA1C 7.0 (H) 04/18/2017   She is not on any medications with diabetes.  She was started on Metformin by Dr. Larose Kells but she developed GI side effects (explosive diarrhea) and would not want to retry this.  She started 3 tablets a day and she felt that this was too high of a dose for her. She started Weight Watchers diet >> lost weight, sugars improved. Now off the diet.  Pt checks her sugars 1x a day and they are: - brunch: 123, 151-178, 195 - 2h after b'fast: n/c - before dinner: 112-158 - 2h after dinner: n/c - bedtime: 151, 185 - nighttime: n/c Lowest sugar was 112;? hypoglycemia awareness. Highest sugar was 175  Glucometer: One Touch Verio  Pt's meals are: - Brunch: toast or roll or bowl of cereal - Dinner: chicken, veggies, mashed potatoes - Snacks: graham crackers  - no CKD, last BUN/creatinine:  Lab Results  Component Value Date   BUN 24 (H) 08/25/2018   BUN 20 06/03/2018   CREATININE 0.76 08/25/2018   CREATININE 0.70 06/03/2018  She is not on an ACE inhibitor/ARB.  -+ HL; last set of lipids: Lab Results  Component Value Date   CHOL 168 10/30/2018   HDL 38.10 (L) 10/30/2018   LDLCALC 96 10/30/2018   LDLDIRECT 120.0 06/03/2018   TRIG 173.0 (H) 10/30/2018   CHOLHDL 4 10/30/2018  On Lipitor 80.  - last eye exam was on 11/04/2018: No DR.   - no numbness and tingling in her feet.  On ASA 81.  Pt has FH of DM in MGF, M uncle.  She had a TAVR procedure.   She had normal TSH levels: Lab Results  Component Value Date   TSH  1.91 06/03/2018   TSH 2.17 10/05/2013    ROS: Constitutional: no weight gain, no weight loss, no fatigue, no subjective hyperthermia, no subjective hypothermia, no nocturia Eyes: no blurry vision, no xerophthalmia ENT: no sore throat, no nodules palpated in neck, no dysphagia, no odynophagia, no hoarseness, + tinnitus, + hypoacusis Cardiovascular: no CP, no SOB, no palpitations, no leg swelling Respiratory: no cough, no SOB, no wheezing Gastrointestinal: no N, no V, no D, no C, no acid reflux Musculoskeletal: no muscle, no joint aches Skin: no rash, + hair loss, + itching, + easy bruising Neurological: no tremors, no numbness or tingling/no dizziness/no HAs Psychiatric: no depression, no anxiety  Past Medical History:  Diagnosis Date  . Aortic stenosis, severe    a. 04/2017: s/p TAVR wtih a Medtronic Evolut-Pro (size 33m, model # Evolutpro29US, serial # [id number]  . CAD (coronary artery disease)    a. pre-TAVR cath 01/2017 showed 80% LCX stenosis. No angina. Plan to treat medically  . Enlarged thyroid   . GERD (gastroesophageal reflux disease)   . Hyperlipidemia   . OSA (obstructive sleep apnea)    no cpap   . Osteoarthritis of both knees   . Tinnitus    Past Surgical History:  Procedure Laterality Date  . BIOPSY THYROID  2015  . DILATION AND CURETTAGE OF UTERUS    .  LEFT HEART CATH AND CORONARY ANGIOGRAPHY N/A 01/15/2017   Procedure: Left Heart Cath and Coronary Angiography;  Surgeon: [name], MD;  Location: [place] [facility];  Service: Cardiovascular;  Laterality: N/A;  . TEE WITHOUT CARDIOVERSION N/A 04/23/2017   Procedure: TRANSESOPHAGEAL ECHOCARDIOGRAM (TEE);  Surgeon: [name], MD;  Location: [place];  Service: Open Heart Surgery;  Laterality: N/A;  . TONSILLECTOMY  age 40  . TOTAL KNEE ARTHROPLASTY Right 12/13/2014   Procedure: RIGHT TOTAL KNEE ARTHROPLASTY;  Surgeon: Gaynelle Arabian, MD;  [facility];  Service: Orthopedics;  Laterality: Right;  . TOTAL  KNEE ARTHROPLASTY Left 02/20/2016   Procedure: LEFT TOTAL KNEE ARTHROPLASTY;  Surgeon: Gaynelle Arabian, MD;  [facility];  Service: Orthopedics;  Laterality: Left;  . TRANSCATHETER AORTIC VALVE REPLACEMENT, TRANSFEMORAL N/A 04/23/2017   Procedure: TRANSCATHETER AORTIC VALVE REPLACEMENT, TRANSFEMORAL;  Surgeon: [name], MD;  Location: [place];  Service: Open Heart Surgery;  Laterality: N/A;  MEDTRONIC VALVE   Social History   Socioeconomic History  . Marital status: Married    Spouse name: Not on file  . Number of children: 2  . Years of education: Not on file  . Highest education level: Not on file  Occupational History  . Occupation: self employed  Social Needs  . Financial resource strain: Not on file  . Food insecurity    Worry: Not on file    Inability: Not on file  . Transportation needs    Medical: Not on file    Non-medical: Not on file  Tobacco Use  . Smoking status: Former Smoker    Packs/day: 2.00    Years: 15.00    Pack years: 30.00    Types: Cigarettes    Quit date: 09/03/1968    Years since quitting: 50.8  . Smokeless tobacco: Never Used  . Tobacco comment: 1-2 ppd. , NONE IN 40 YEARS  Substance and Sexual Activity  . Alcohol use: No  . Drug use: No  . Sexual activity: Not on file  Lifestyle  . Physical activity    Days per week: Not on file    Minutes per session: Not on file  . Stress: Not on file  Relationships  . Social Herbalist on phone: Not on file    Gets together: Not on file    Attends religious service: Not on file    Active member of club or organization: Not on file    Attends meetings of clubs or organizations: Not on file    Relationship status: Not on file  . Intimate partner violence    Fear of current or ex partner: Not on file    Emotionally abused: Not on file    Physically abused: Not on file    Forced sexual activity: Not on file  Other Topics Concern  . Not on file  Social History Narrative   Household: pt  and husband    Current Outpatient Medications on File Prior to Visit  Medication Sig Dispense Refill  . aspirin EC 81 MG tablet Take 81 mg by mouth every evening.    Marland Kitchen atorvastatin (LIPITOR) 80 MG tablet Take 1 tablet (80 mg total) by mouth at bedtime. 90 tablet 3  . BIOTIN PO Take 1 tablet by mouth daily.    . Blood Glucose Monitoring Suppl (ONETOUCH VERIO FLEX SYSTEM) w/Device KIT Check blood sugars three times daily 1 kit 0  . clindamycin (CLEOCIN) 300 MG capsule Take 2 tablets by mouth 60  minutes prior to dental appointments    . dicyclomine (BENTYL) 10 MG capsule Take 1 capsule (10 mg total) by mouth 4 (four) times daily as needed for spasms. 40 capsule 0  . glucose blood (ONETOUCH VERIO) test strip Check blood sugars three times daily 300 each 12  . metoprolol tartrate (LOPRESSOR) 25 MG tablet Take 1 tablet by mouth twice daily 180 tablet 0  . ONE TOUCH LANCETS MISC Check blood sugar three times daily 300 each 12  . Multiple Vitamin (MULTIVITAMIN WITH MINERALS) TABS tablet Take 1 tablet by mouth daily.    . nitroGLYCERIN (NITROSTAT) 0.4 MG SL tablet Place 1 tablet (0.4 mg total) under the tongue every 5 (five) minutes as needed for chest pain. (Patient not taking: Reported on 06/03/2018) 25 tablet 3   No current facility-administered medications on file prior to visit.    Allergies  Allergen Reactions  . Advil [Ibuprofen] Anaphylaxis, Swelling and Other (See Comments)    Throat swelling per patient  . Penicillins Hives, Swelling and Other (See Comments)    Tongue swelling Has patient had a PCN reaction causing immediate rash, facial/tongue/throat swelling, SOB or lightheadedness with hypotension: yes Has patient had a PCN reaction causing severe rash involving mucus membranes or skin necrosis: no Has patient had a PCN reaction that required hospitalization no Has patient had a PCN reaction occurring within the last 10 years: no If all of the above answers are "NO", then may proceed  with Cepha  . Demerol [Meperidine] Nausea And Vomiting  . Other Hives and Other (See Comments)    Duck  . Cortisone Anxiety and Other (See Comments)    Loopy    Family History  Problem Relation Age of Onset  . Heart disease Mother   . Heart attack Mother 23  . Prostate cancer Father   . Diabetes Maternal Grandfather   . Diabetes Maternal Uncle   . Hashimoto's thyroiditis Daughter   . Hashimoto's thyroiditis Son   . Breast cancer Neg Hx    PE: BP (!) 142/80   Pulse 64   Ht _0  (1.549 m)   Wt 246 lb (111.6 kg)   SpO2 97%   BMI 46.48 kg/m  Wt Readings from Last 3 Encounters:  07/14/19 246 lb (111.6 kg)  06/24/19 247 lb (112 kg)  10/28/18 236 lb 2 oz (107.1 kg)   Constitutional: overweight, in NAD Eyes: PERRLA, EOMI, no exophthalmos ENT: moist mucous membranes, no thyromegaly, no cervical lymphadenopathy Cardiovascular: RRR, No RG, +1/6 SEM Respiratory: CTA B Gastrointestinal: abdomen soft, NT, ND, BS+ Musculoskeletal: no deformities, strength intact in all 4 Skin: moist, warm, no rashes Neurological: no tremor with outstretched hands, DTR normal in all 4  ASSESSMENT: 1. DM2, non-insulin-dependent, uncontrolled, with complications - CAD - carotid a. Ds. - severe AoSt  PLAN:  1. Patient with long-standing, uncontrolled diabetes, diet controlled.  She recently was tried on Metformin but had GI intolerance.  She would not want to restart this. -She was successful in losing weight and improving her diabetes on the weight watcher diet in the past, but she felt that she could not continue with this.  At this visit, I suggested some changes in her diet to include more fruits and vegetables and to reduce fatty and processed foods.  We discussed about the concept of insulin resistance and I explained how this is the root of DM2 and what she can do to decrease and reverse this.  She does not feel that she can  follow an exclusive plant-based diet, but I advised her to try to  incorporate some of the high-fiber vegetables with her meals.  Given specific instructions about healthier meals. -We discussed about options for treatment.  She has CAD, so the first options would be GLP-1 receptor agonist and SGLT2 inhibitor.  We decided to continue with a low dose of SGLT2 inhibitor.  We have to be careful with her blood pressure on this medication I advised her to check this at home and let me know if it is too low.  I advised her to stay very well-hydrated and to let me know if she has any yeast infections. - I suggested to:  Patient Instructions  Please start: - Farxiga 5 mg daily before b'fast  Please let me know if the sugars are consistently <80 or >200.  Please return in 3 months with your sugar log.   - Strongly advised her to start checking sugars at different times of the day - check 1-2x a day, rotating checks - discussed about CBG targets for treatment: 80-130 mg/dL before meals and <180 mg/dL after meals; target HbA1c <7%. - given sugar log and advised how to fill it and to bring it at next appt  - given foot care handout and explained the principles  - given instructions for hypoglycemia management "15-15 rule"  - advised for yearly eye exams  - Return to clinic in 3 mo with sugar log   Philemon Kingdom, MD PhD [facility] Endocrinology

## 2019-07-14 NOTE — Patient Instructions (Signed)
Please start: - Farxiga 5 mg daily before b'fast  Please let me know if the sugars are consistently <80 or >200.  Please return in 3 months with your sugar log.   PATIENT INSTRUCTIONS FOR TYPE 2 DIABETES:  DIET AND EXERCISE Diet and exercise is an important part of diabetic treatment.  We recommended aerobic exercise in the form of brisk walking (working between 40-60% of maximal aerobic capacity, similar to brisk walking) for 150 minutes per week (such as 30 minutes five days per week) along with 3 times per week performing 'resistance' training (using various gauge rubber tubes with handles) 5-10 exercises involving the major muscle groups (upper body, lower body and core) performing 10-15 repetitions (or near fatigue) each exercise. Start at half the above goal but build slowly to reach the above goals. If limited by weight, joint pain, or disability, we recommend daily walking in a swimming pool with water up to waist to reduce pressure from joints while allow for adequate exercise.    BLOOD GLUCOSES Monitoring your blood glucoses is important for continued management of your diabetes. Please check your blood glucoses 2-4 times a day: fasting, before meals and at bedtime (you can rotate these measurements - e.g. one day check before the 3 meals, the next day check before 2 of the meals and before bedtime, etc.).   HYPOGLYCEMIA (low blood sugar) Hypoglycemia is usually a reaction to not eating, exercising, or taking too much insulin/ other diabetes drugs.  Symptoms include tremors, sweating, hunger, confusion, headache, etc. Treat IMMEDIATELY with 15 grams of Carbs: . 4 glucose tablets .  cup regular juice/soda . 2 tablespoons raisins . 4 teaspoons sugar . 1 tablespoon honey Recheck blood glucose in 15 mins and repeat above if still symptomatic/blood glucose <100.  RECOMMENDATIONS TO REDUCE YOUR RISK OF DIABETIC COMPLICATIONS: * Take your prescribed MEDICATION(S) * Follow a DIABETIC  diet: Complex carbs, fiber rich foods, (monounsaturated and polyunsaturated) fats * AVOID saturated/trans fats, high fat foods, >2,300 mg salt per day. * EXERCISE at least 5 times a week for 30 minutes or preferably daily.  * DO NOT SMOKE OR DRINK more than 1 drink a day. * Check your FEET every day. Do not wear tightfitting shoes. Contact us if you develop an ulcer * See your EYE doctor once a year or more if needed * Get a FLU shot once a year * Get a PNEUMONIA vaccine once before and once after age 23 years  GOALS:  * Your Hemoglobin A1c of <7%  * fasting sugars need to be <130 * after meals sugars need to be <180 (2h after you start eating) * Your Systolic BP should be [id number] or lower  * Your Diastolic BP should be 80 or lower  * Your HDL (Good Cholesterol) should be 40 or higher  * Your LDL (Bad Cholesterol) should be 100 or lower. * Your Triglycerides should be 150 or lower  * Your Urine microalbumin (kidney function) should be <30 * Your Body Mass Index should be 25 or lower    Please consider the following ways to cut down carbs and fat and increase fiber and micronutrients in your diet: - substitute whole grain for white bread or pasta - substitute brown rice for white rice - substitute 90-calorie flat bread pieces for slices of bread when possible - substitute sweet potatoes or yams for white potatoes - substitute humus for margarine - substitute tofu for cheese when possible - substitute almond or rice milk for  regular milk (would not drink soy milk daily due to concern for soy estrogen influence on breast cancer risk) - substitute dark chocolate for other sweets when possible - substitute water - can add lemon or orange slices for taste - for diet sodas (artificial sweeteners will trick your body that you can eat sweets without getting calories and will lead you to overeating and weight gain in the long run) - do not skip breakfast or other meals (this will slow down the  metabolism and will result in more weight gain over time)  - can try smoothies made from fruit and almond/rice milk in am instead of regular breakfast - can also try old-fashioned (not instant) oatmeal made with almond/rice milk in am - order the dressing on the side when eating salad at a restaurant (pour less than half of the dressing on the salad) - eat as little meat as possible - can try juicing, but should not forget that juicing will get rid of the fiber, so would alternate with eating raw veg./fruits or drinking smoothies - use as little oil as possible, even when using olive oil - can dress a salad with a mix of balsamic vinegar and lemon juice, for e.g. - use agave nectar, stevia sugar, or regular sugar rather than artificial sweateners - steam or broil/roast veggies  - snack on veggies/fruit/nuts (unsalted, preferably) when possible, rather than processed foods - reduce or eliminate aspartame in diet (it is in diet sodas, chewing gum, etc) Read the labels!  Try to read Dr. Janene Harvey book: "Program for Reversing Diabetes" for other ideas for healthy eating.

## 2019-07-15 ENCOUNTER — Telehealth: Payer: Self-pay | Admitting: Internal Medicine

## 2019-07-15 ENCOUNTER — Other Ambulatory Visit: Payer: Self-pay | Admitting: Internal Medicine

## 2019-07-15 LAB — POCT GLYCOSYLATED HEMOGLOBIN (HGB A1C): Hemoglobin A1C: 7.3 % — AB (ref 4.0–5.6)

## 2019-07-15 MED ORDER — JARDIANCE 10 MG PO TABS: 10.0000 mg | ORAL_TABLET | Freq: Every day | ORAL | 2 refills | Status: DC

## 2019-07-15 NOTE — Addendum Note (Signed)
Addended by: Cardell Peach I on: 07/15/2019 08:11 AM   Modules accepted: Orders

## 2019-07-15 NOTE — Telephone Encounter (Signed)
Jardiance sent, will submit PA

## 2019-07-15 NOTE — Telephone Encounter (Signed)
Mychart message sent.

## 2019-07-15 NOTE — Telephone Encounter (Signed)
Please send Jardiance 10 mg daily

## 2019-07-15 NOTE — Telephone Encounter (Signed)
Debra Knight is not covered.

## 2019-07-15 NOTE — Telephone Encounter (Signed)
Let's start a PA -they need to cover one of them.  She has CAD.

## 2019-07-15 NOTE — Telephone Encounter (Addendum)
Please send the patient a message  Debra Knight,  you are due for a thyroid ultrasound to follow-up on your thyroid nodule. Also, I am recommending CT chest because back in 07/30/2018, you had a CT chest done at [facility] and it showed a lung nodule.  If you are interested in pursue those test please call my office and I will arrange them.   Finally, please schedule a routine visit with me at your convenience.

## 2019-07-15 NOTE — Telephone Encounter (Signed)
Pharmacy comment: please advise patient cannot afford the copay for $564.00.

## 2019-07-16 ENCOUNTER — Telehealth: Payer: Self-pay

## 2019-07-16 NOTE — Telephone Encounter (Signed)
Lpm with Dr Kyla Balzarine approval to begin [place].  I told her to call, if questions.

## 2019-07-17 NOTE — Telephone Encounter (Signed)
PA not needed.

## 2019-08-10 ENCOUNTER — Other Ambulatory Visit: Payer: Self-pay | Admitting: Cardiovascular Disease

## 2019-09-14 ENCOUNTER — Telehealth: Payer: Self-pay | Admitting: Internal Medicine

## 2019-09-14 NOTE — Telephone Encounter (Signed)
  Melissa, do you think may be a coupon can help?  Or maybe she can check with her insurance whether the following options are better coverage: Augustina Mood, [place]. Regarding Metformin, I will start with 500 mg with dinner for 4 days, then increase to 500 mg twice a day with breakfast and dinner.

## 2019-09-14 NOTE — Telephone Encounter (Signed)
Patient requests to be called at ph# [phone] re: Patient went to [facility] to pick up refill for Jardiance and was told by [facility] that because it is a new year patient would have to pay $446.00 for the refill. Patient is not able to pay the $446.00. Patient has Metformin in the house  and would like to be advised on what is the lowest dose of Metformin Dr. Cruzita Lederer recommends she take (patient does not do well on very large dose). Patient has an appointment scheduled for 10/16/19 and needs to take the Metformin until then. Patient only has 3 more Jardiance left.

## 2019-09-16 NOTE — Telephone Encounter (Signed)
Patient does not want to take the Metformin if she can help it so she will call her insurance to find out what is covered.

## 2019-09-18 ENCOUNTER — Telehealth: Payer: Self-pay | Admitting: Internal Medicine

## 2019-09-18 MED ORDER — ACCU-CHEK GUIDE VI STRP: ORAL_STRIP | 12 refills | Status: DC

## 2019-09-18 MED ORDER — ACCU-CHEK GUIDE W/DEVICE KIT: 1.0000 | PACK | 0 refills | Status: DC

## 2019-09-18 NOTE — Telephone Encounter (Signed)
Patient requests the following RX be sent asap AND Patient's wants Dr. Cruzita Lederer to know that following RX is and needs to be covered under Medicare Part D  MEDICATION: glucose blood (ONETOUCH VERIO) test strip  PHARMACY:   [place], [place] - [address] [place] Phone:  [phone]  Fax:  [phone]      IS THIS A 90 DAY SUPPLY : ?  IS PATIENT OUT OF MEDICATION: Yes-please send RX asap  IF NOT; HOW MUCH IS LEFT: 0  LAST APPOINTMENT DATE: @1 /07/2020  NEXT APPOINTMENT DATE:@2 /08/2020  DO WE HAVE YOUR PERMISSION TO LEAVE A DETAILED MESSAGE: Yes  OTHER COMMENTS:    **Let patient know to contact pharmacy at the end of the day to make sure medication is ready. **  ** Please notify patient to allow 48-72 hours to process**  **Encourage patient to contact the pharmacy for refills or they can request refills through [facility]**

## 2019-09-18 NOTE — Telephone Encounter (Signed)
As of 09/2019 Medicare no longer covers OneTouch products.  Medicare pays for Accu-chek guide.  New meter and strips sent.

## 2019-09-23 ENCOUNTER — Telehealth: Payer: Self-pay

## 2019-09-23 NOTE — Telephone Encounter (Signed)
Chart notes for medical necessity faxed to [facility] for testing strips.

## 2019-10-01 ENCOUNTER — Telehealth: Payer: Self-pay

## 2019-10-01 NOTE — Telephone Encounter (Signed)
I have been trying to fax chart notes and documentation of Medical Necessity to [facility] document audit/review team since 09/23/2019.  I have tried several times and every time it gets rejected as busy or no signal. The form we received did not have a phone number listed.  Patient very upset but once I explained the situation to her she was understanding. I have located a phone number for the doc review and confirmed it is the correct fax number and they do not know why it is not getting through. They took my number and said someone will look into it and call me back.  I have let the patient know this.

## 2019-10-05 ENCOUNTER — Telehealth: Payer: Self-pay | Admitting: Internal Medicine

## 2019-10-05 NOTE — Telephone Encounter (Signed)
Returned phone call to Debra Knight and she stated that under new Medicare guidelines, non insulin dependent diabetics will not be approved for test strips checking more than once daily.  Debra Knight stated that she spoke with [name] because she had been trying to fax the documents needed for some time now, and cannot get them to go through. Debra Knight provided the fax number [phone].

## 2019-10-05 NOTE — Telephone Encounter (Signed)
[name] at [facility] [place] [facility] [place] requests to be called at ph# [phone]  re: problems with RX for test strips. [name] equests to speak directly with Nurse.

## 2019-10-06 MED ORDER — ACCU-CHEK GUIDE VI STRP: ORAL_STRIP | 11 refills | Status: DC

## 2019-10-06 NOTE — Telephone Encounter (Signed)
RX changed to reflect testing once a day since Medicare won't pay for more.

## 2019-10-14 ENCOUNTER — Other Ambulatory Visit: Payer: Self-pay

## 2019-10-15 ENCOUNTER — Ambulatory Visit: Payer: Self-pay

## 2019-10-16 ENCOUNTER — Other Ambulatory Visit: Payer: Self-pay

## 2019-10-16 ENCOUNTER — Ambulatory Visit (INDEPENDENT_AMBULATORY_CARE_PROVIDER_SITE_OTHER): Payer: Medicare Other | Admitting: Internal Medicine

## 2019-10-16 ENCOUNTER — Encounter: Payer: Self-pay | Admitting: Internal Medicine

## 2019-10-16 VITALS — BP 130/80 | HR 72 | Ht 61.0 in | Wt 237.0 lb

## 2019-10-16 DIAGNOSIS — E1159 Type 2 diabetes mellitus with other circulatory complications: Secondary | ICD-10-CM | POA: Diagnosis not present

## 2019-10-16 DIAGNOSIS — E7849 Other hyperlipidemia: Secondary | ICD-10-CM

## 2019-10-16 DIAGNOSIS — E1165 Type 2 diabetes mellitus with hyperglycemia: Secondary | ICD-10-CM | POA: Diagnosis not present

## 2019-10-16 DIAGNOSIS — E669 Obesity, unspecified: Secondary | ICD-10-CM

## 2019-10-16 LAB — COMPLETE METABOLIC PANEL WITH GFR
AG Ratio: 1.8 (calc) (ref 1.0–2.5)
ALT: 22 U/L (ref 6–29)
AST: 16 U/L (ref 10–35)
Albumin: 4.1 g/dL (ref 3.6–5.1)
Alkaline phosphatase (APISO): 106 U/L (ref 37–153)
BUN: 12 mg/dL (ref 7–25)
CO2: 27 mmol/L (ref 20–32)
Calcium: 9.5 mg/dL (ref 8.6–10.4)
Chloride: 105 mmol/L (ref 98–110)
Creat: 0.68 mg/dL (ref 0.60–0.93)
GFR, Est African American: 100 mL/min/{1.73_m2} (ref >=60)
GFR, Est Non African American: 86 mL/min/{1.73_m2} (ref >=60)
Globulin: 2.3 g/dL (calc) (ref 1.9–3.7)
Glucose, Bld: 152 mg/dL — ABNORMAL HIGH (ref 65–99)
Potassium: 3.8 mmol/L (ref 3.5–5.3)
Sodium: 142 mmol/L (ref 135–146)
Total Bilirubin: 0.8 mg/dL (ref 0.2–1.2)
Total Protein: 6.4 g/dL (ref 6.1–8.1)

## 2019-10-16 LAB — LIPID PANEL
Cholesterol: 146 mg/dL (ref 0–200)
HDL: 41 mg/dL (ref >39.00)
LDL Cholesterol: 75 mg/dL (ref 0–99)
NonHDL: 105.16
Total CHOL/HDL Ratio: 4
Triglycerides: 153 mg/dL — ABNORMAL HIGH (ref 0.0–149.0)
VLDL: 30.6 mg/dL (ref 0.0–40.0)

## 2019-10-16 LAB — MICROALBUMIN / CREATININE URINE RATIO
Creatinine,U: 218.2 mg/dL
Microalb Creat Ratio: 1.2 mg/g (ref 0.0–30.0)
Microalb, Ur: 2.6 mg/dL — ABNORMAL HIGH (ref 0.0–1.9)

## 2019-10-16 LAB — POCT GLYCOSYLATED HEMOGLOBIN (HGB A1C): Hemoglobin A1C: 6.9 % — AB (ref 4.0–5.6)

## 2019-10-16 MED ORDER — METFORMIN HCL ER 500 MG PO TB24: 500.0000 mg | ORAL_TABLET | Freq: Two times a day (BID) | ORAL | 3 refills | Status: DC

## 2019-10-16 NOTE — Patient Instructions (Addendum)
Please stop Metformin and start the extended release metformin 500 mg 2x a day with meals.  Please return in 3-4 months with your sugar log.

## 2019-10-16 NOTE — Progress Notes (Signed)
Patient ID: Debra Knight, female   DOB: 25-Mar-1946, 74 y.o.   MRN: [id number]  This visit occurred during the SARS-CoV-2 public health emergency.  Safety protocols were in place, including screening questions prior to the visit, additional usage of staff PPE, and extensive cleaning of exam room while observing appropriate contact time as indicated for disinfecting solutions.   HPI: Debra Knight is a 74 y.o.-year-old female, initially referred by her cardiologist, Dr. Johnsie Cancel, returning for follow-up for DM2, dx in 2018, non-insulin-dependent, uncontrolled, with complications (CAD, carotid artery disease, severe aortic stenosis).  Last visit 3 months ago. She will not return to see Dr. Larose Kells.  Reviewed latest HbA1c level: Lab Results  Component Value Date   HGBA1C 7.3 (A) 07/15/2019   HGBA1C 6.9 (H) 10/30/2018   HGBA1C 7.9 (H) 06/03/2018   HGBA1C 7.0 (H) 04/18/2017   At last visit she was not on any medications for diabetes.  We tried to start her on an SGLT 2 inhibitor: - Farxiga 5 mg daily - not covered - Jardiance 10 mg daily - stopped being covered - stopped 09/2019 - restarted Metformin 850 mg 2x a day >> explosive diarrhea, slightly improving now  Before last visit, she was started on Metformin by Dr. Larose Kells but she developed GI side effects (explosive diarrhea) and would not want to retry this.  She started 3 tablets a day and she felt that this was too high of a dose for her. She started Weight Watchers diet >> lost weight, sugars improved. Now off the diet.  Pt checks her sugars once a day: - brunch: 123, 151-178, 195 >> 115-131 - 2h after b'fast: n/c - before dinner: 112-158 >> n/c - 2h after dinner: n/c - bedtime: 151, 185 >> 200 - nighttime: n/c Lowest sugar was 112 >> 115; it is unclear at which level she has hypoglycemia awareness Highest sugar was 175 >> 200  Glucometer: One Touch Verio >>Accuchek guide  Pt's meals are: - Brunch: toast or roll or bowl of cereal -  Dinner: chicken, veggies, mashed potatoes - Snacks: graham crackers  No CKD, last BUN/creatinine:  Lab Results  Component Value Date   BUN 24 (H) 08/25/2018   BUN 20 06/03/2018   CREATININE 0.76 08/25/2018   CREATININE 0.70 06/03/2018  She is not on ACE inhibitor/ARB.  + HL last set of lipids: Lab Results  Component Value Date   CHOL 168 10/30/2018   HDL 38.10 (L) 10/30/2018   LDLCALC 96 10/30/2018   LDLDIRECT 120.0 06/03/2018   TRIG 173.0 (H) 10/30/2018   CHOLHDL 4 10/30/2018  On Lipitor 80.  - last eye exam was on 11/2018: No DR  -No numbness but does have tingling on the small area in her feet on the right dose.  On ASA 81.  Pt has FH of DM in MGF, M uncle.  She had a TAVR procedure.   She had normal TSH levels: Lab Results  Component Value Date   TSH 1.91 06/03/2018   TSH 2.17 10/05/2013    ROS: Constitutional: no weight gain/+ weight loss, no fatigue, no subjective hyperthermia, no subjective hypothermia Eyes: no blurry vision, no xerophthalmia ENT: no sore throat, no nodules palpated in neck, no dysphagia, no odynophagia, no hoarseness Cardiovascular: no CP/no SOB/no palpitations/no leg swelling Respiratory: no cough/no SOB/no wheezing Gastrointestinal: no N/no V/+ D/no C/no acid reflux Musculoskeletal: no muscle aches/no joint aches Skin: no rashes, no hair loss Neurological: no tremors/no numbness/+ tingling/no dizziness  I reviewed pt's medications,  allergies, PMH, social hx, family hx, and changes were documented in the history of present illness. Otherwise, unchanged from my initial visit note.  Past Medical History:  Diagnosis Date  . Aortic stenosis, severe    a. 04/2017: s/p TAVR wtih a Medtronic Evolut-Pro (size 14m, model # Evolutpro29US, serial # [id number]  . CAD (coronary artery disease)    a. pre-TAVR cath 01/2017 showed 80% LCX stenosis. No angina. Plan to treat medically  . Enlarged thyroid   . GERD (gastroesophageal reflux disease)    . Hyperlipidemia   . OSA (obstructive sleep apnea)    no cpap   . Osteoarthritis of both knees   . Tinnitus    Past Surgical History:  Procedure Laterality Date  . BIOPSY THYROID  2015  . DILATION AND CURETTAGE OF UTERUS    . LEFT HEART CATH AND CORONARY ANGIOGRAPHY N/A 01/15/2017   Procedure: Left Heart Cath and Coronary Angiography;  Surgeon: CSherren Mocha MD;  Location: [facility];  Service: Cardiovascular;  Laterality: N/A;  . TEE WITHOUT CARDIOVERSION N/A 04/23/2017   Procedure: TRANSESOPHAGEAL ECHOCARDIOGRAM (TEE);  Surgeon: CSherren Mocha MD;  Location: [facility]  Service: Open Heart Surgery;  Laterality: N/A;  . TONSILLECTOMY  age 74 . TOTAL KNEE ARTHROPLASTY Right 12/13/2014   Procedure: RIGHT TOTAL KNEE ARTHROPLASTY;  Surgeon: FGaynelle Arabian MD;  Location: [facility];  Service: Orthopedics;  Laterality: Right;  . TOTAL KNEE ARTHROPLASTY Left 02/20/2016   Procedure: LEFT TOTAL KNEE ARTHROPLASTY;  Surgeon: FGaynelle Arabian MD;  Location: [facility];  Service: Orthopedics;  Laterality: Left;  . TRANSCATHETER AORTIC VALVE REPLACEMENT, TRANSFEMORAL N/A 04/23/2017   Procedure: TRANSCATHETER AORTIC VALVE REPLACEMENT, TRANSFEMORAL;  Surgeon: CSherren Mocha MD;  Location: [facility]  Service: Open Heart Surgery;  Laterality: N/A;  MEDTRONIC VALVE   Social History   Socioeconomic History  . Marital status: Married    Spouse name: Not on file  . Number of children: 2  . Years of education: Not on file  . Highest education level: Not on file  Occupational History  . Occupation: self employed  Tobacco Use  . Smoking status: Former Smoker    Packs/day: 2.00    Years: 15.00    Pack years: 30.00    Types: Cigarettes    Quit date: 09/03/1968    Years since quitting: 51.1  . Smokeless tobacco: Never Used  . Tobacco comment: 1-2 ppd. , NONE IN 40 YEARS  Substance and Sexual Activity  . Alcohol use: No  . Drug use: No  . Sexual activity: Not on file  Other Topics Concern  . Not on file   Social History Narrative   Household: pt and husband    Social Determinants of HRadio broadcast assistantStrain:   . Difficulty of Paying Living Expenses: Not on file  Food Insecurity:   . Worried About RCharity fundraiserin the Last Year: Not on file  . Ran Out of Food in the Last Year: Not on file  Transportation Needs:   . Lack of Transportation (Medical): Not on file  . Lack of Transportation (Non-Medical): Not on file  Physical Activity:   . Days of Exercise per Week: Not on file  . Minutes of Exercise per Session: Not on file  Stress:   . Feeling of Stress : Not on file  Social Connections:   . Frequency of Communication with Friends and Family: Not on file  . Frequency of Social Gatherings with Friends and Family:  Not on file  . Attends Religious Services: Not on file  . Active Member of Clubs or Organizations: Not on file  . Attends Archivist Meetings: Not on file  . Marital Status: Not on file  Intimate Partner Violence:   . Fear of Current or Ex-Partner: Not on file  . Emotionally Abused: Not on file  . Physically Abused: Not on file  . Sexually Abused: Not on file   Current Outpatient Medications on File Prior to Visit  Medication Sig Dispense Refill  . aspirin EC 81 MG tablet Take 81 mg by mouth every evening.    Marland Kitchen atorvastatin (LIPITOR) 80 MG tablet Take 1 tablet (80 mg total) by mouth at bedtime. 90 tablet 3  . BIOTIN PO Take 1 tablet by mouth daily.    . Blood Glucose Monitoring Suppl (ACCU-CHEK GUIDE) w/Device KIT 1 kit by Does not apply route See admin instructions. Use to check blood sugar 3 times a day 1 kit 0  . clindamycin (CLEOCIN) 300 MG capsule Take 2 tablets by mouth 60 minutes prior to dental appointments    . dapagliflozin propanediol (FARXIGA) 5 MG TABS tablet Take 5 mg by mouth daily before breakfast. 30 tablet 5  . dicyclomine (BENTYL) 10 MG capsule Take 1 capsule (10 mg total) by mouth 4 (four) times daily as needed for spasms. 40  capsule 0  . empagliflozin (JARDIANCE) 10 MG TABS tablet Take 10 mg by mouth daily before breakfast. 30 tablet 2  . glucose blood (ACCU-CHEK GUIDE) test strip Use as instructed to check blood sugar once a day 100 each 11  . metoprolol tartrate (LOPRESSOR) 25 MG tablet Take 1 tablet by mouth twice daily 180 tablet 0  . Multiple Vitamin (MULTIVITAMIN WITH MINERALS) TABS tablet Take 1 tablet by mouth daily.    . nitroGLYCERIN (NITROSTAT) 0.4 MG SL tablet Place 1 tablet (0.4 mg total) under the tongue every 5 (five) minutes as needed for chest pain. (Patient not taking: Reported on 06/03/2018) 25 tablet 3  . ONE TOUCH LANCETS MISC Check blood sugar three times daily 300 each 12   No current facility-administered medications on file prior to visit.   Allergies  Allergen Reactions  . Advil [Ibuprofen] Anaphylaxis, Swelling and Other (See Comments)    Throat swelling per patient  . Penicillins Hives, Swelling and Other (See Comments)    Tongue swelling Has patient had a PCN reaction causing immediate rash, facial/tongue/throat swelling, SOB or lightheadedness with hypotension: yes Has patient had a PCN reaction causing severe rash involving mucus membranes or skin necrosis: no Has patient had a PCN reaction that required hospitalization no Has patient had a PCN reaction occurring within the last 10 years: no If all of the above answers are "NO", then may proceed with Cepha  . Demerol [Meperidine] Nausea And Vomiting  . Other Hives and Other (See Comments)    Duck  . Cortisone Anxiety and Other (See Comments)    Loopy    Family History  Problem Relation Age of Onset  . Heart disease Mother   . Heart attack Mother 46  . Prostate cancer Father   . Diabetes Maternal Grandfather   . Diabetes Maternal Uncle   . Hashimoto's thyroiditis Daughter   . Hashimoto's thyroiditis Son   . Breast cancer Neg Hx    PE: BP 130/80   Pulse 72   Ht 5' 1"  (1.549 m)   Wt 237 lb (107.5 kg)   SpO2 99%   BMI  44.78 kg/m  Wt Readings from Last 3 Encounters:  10/16/19 237 lb (107.5 kg)  07/14/19 246 lb (111.6 kg)  06/24/19 247 lb (112 kg)   Constitutional: overweight, in NAD Eyes: PERRLA, EOMI, no exophthalmos ENT: moist mucous membranes, no thyromegaly, no cervical lymphadenopathy Cardiovascular: RRR, No RG, +1/6 SEM Respiratory: CTA B Gastrointestinal: abdomen soft, NT, ND, BS+ Musculoskeletal: no deformities, strength intact in all 4 Skin: moist, warm, no rashes Neurological: no tremor with outstretched hands, DTR normal in all 4  ASSESSMENT: 1. DM2, non-insulin-dependent, uncontrolled, with complications - CAD - carotid a. Ds. - severe AoSt  2. HL  3. Obesity class III  PLAN:  1. Patient with longstanding, uncontrolled, type 2 diabetes, previously diet-controlled.  She tried Metformin but had severe GI intolerance and would not want to start this.  In the past, she was successful in losing weight and improving her diabetes on the weight watchers diet but she could not continue with this.  At last visit, we discussed about changes in her diet to check with adapt to improve her insulin resistance.  I also gave her specific instructions about healthier meals and advised her to incorporate high-fiber vegetables with her meals.  Due to her CAD, I suggested either a GLP-1 receptor agonist or an SGLT2 inhibitor.  We decided to go ahead with a low-dose SGLT 2 inhibitor but we had to be careful with her blood pressure on this medication.  I sent a prescription for Farxiga 5 mg daily at last visit but this was not covered by her insurance.  She was able to start Jardiance 10 mg daily and use it for 2 months, however, in the new year, she has a very high co-pay of $400 per month and she could not obtain it.  Therefore, she restarted Metformin initially half a tablet a day and then increase to a full tablet 850 mg daily which she is trying to get in twice a day.  Her sugars did improve but she is having  significant diarrhea, including at night.  She feels that this is may be improving slightly -At this visit we discussed to try Metformin ER instead of regular Metformin in the lower dose, initially 500 mg with dinner and then increase to 500 mg twice a day if possible but she needs to let me know if she cannot tolerate this.  In that case, we may need glipizide or insulin if her insurance does not cover other medications. - I suggested to:  Patient Instructions  Please stop Metformin and start the extended release metformin 500 mg 2x a day with meals.  Please return in 3-4 months with your sugar log.   - we checked her HbA1c: 6.9% (improved) - advised to check sugars at different times of the day - 1x a day, rotating check times - advised for yearly eye exams >> she is UTD - she is due for annual labs-we will check them today - return to clinic in 3-4 months  2. HL - Reviewed latest lipid panel from 10/2018: LDL improved, but still not at goal of less than 70, triglycerides high, HDL low Lab Results  Component Value Date   CHOL 168 10/30/2018   HDL 38.10 (L) 10/30/2018   LDLCALC 96 10/30/2018   LDLDIRECT 120.0 06/03/2018   TRIG 173.0 (H) 10/30/2018   CHOLHDL 4 10/30/2018  - Continues the statin without side effects. -We will check a lipid panel today  3.  Obesity class III -She lost 9  pounds since last visit but mostly due to GI discomfort due to Metformin.  She is also trying to improve her diet and avoiding pasta, which is her favorite -Unfortunately, her insurance does not cover an SGLT 2 inhibitor and I am not sure how she would tolerate a GLP-1 receptor agonist due to her stomach problems.  Also, I doubt that this would be affordable.  For now, we will try Metformin ER which is appetite suppressant.  Office Visit on 10/16/2019  Component Date Value Ref Range Status  . Glucose, Bld 10/16/2019 152* 65 - 99 mg/dL Final   Comment: .            Fasting reference interval . For  someone without known diabetes, a glucose value >125 mg/dL indicates that they may have diabetes and this should be confirmed with a follow-up test. .   . BUN 10/16/2019 12  7 - 25 mg/dL Final  . Creat 10/16/2019 0.68  0.60 - 0.93 mg/dL Final   Comment: For patients >81 years of age, the reference limit for Creatinine is approximately 13% higher for people identified as African-American. .   . GFR, Est Non African American 10/16/2019 86  > OR = 60 mL/min/1.94m Final  . GFR, Est African American 10/16/2019 100  > OR = 60 mL/min/1.719mFinal  . BUN/Creatinine Ratio 0219/37/9024OT APPLICABLE  6 - 22 (calc) Final  . Sodium 10/16/2019 142  135 - 146 mmol/L Final  . Potassium 10/16/2019 3.8  3.5 - 5.3 mmol/L Final  . Chloride 10/16/2019 105  98 - 110 mmol/L Final  . CO2 10/16/2019 27  20 - 32 mmol/L Final  . Calcium 10/16/2019 9.5  8.6 - 10.4 mg/dL Final  . Total Protein 10/16/2019 6.4  6.1 - 8.1 g/dL Final  . Albumin 10/16/2019 4.1  3.6 - 5.1 g/dL Final  . Globulin 10/16/2019 2.3  1.9 - 3.7 g/dL (calc) Final  . AG Ratio 10/16/2019 1.8  1.0 - 2.5 (calc) Final  . Total Bilirubin 10/16/2019 0.8  0.2 - 1.2 mg/dL Final  . Alkaline phosphatase (APISO) 10/16/2019 106  37 - 153 U/L Final  . AST 10/16/2019 16  10 - 35 U/L Final  . ALT 10/16/2019 22  6 - 29 U/L Final  . Cholesterol 10/16/2019 146  0 - 200 mg/dL Final   ATP III Classification       Desirable:  < 200 mg/dL               Borderline High:  200 - 239 mg/dL          High:  > = 240 mg/dL  . Triglycerides 10/16/2019 153.0* 0.0 - 149.0 mg/dL Final   Normal:  <150 mg/dLBorderline High:  150 - 199 mg/dL  . HDL 10/16/2019 41.00  >39.00 mg/dL Final  . VLDL 10/16/2019 30.6  0.0 - 40.0 mg/dL Final  . LDL Cholesterol 10/16/2019 75  0 - 99 mg/dL Final  . Total CHOL/HDL Ratio 10/16/2019 4   Final                  Men          Women1/2 Average Risk     3.4          3.3Average Risk          5.0          4.42X Average Risk          9.6  7.13X Average Risk          15.0          11.0                      . NonHDL 10/16/2019 105.16   Final   NOTE:  Non-HDL goal should be 30 mg/dL higher than patient's LDL goal (i.e. LDL goal of < 70 mg/dL, would have non-HDL goal of < 100 mg/dL)  . Microalb, Ur 10/16/2019 2.6* 0.0 - 1.9 mg/dL Final  . Creatinine,U 10/16/2019 218.2  mg/dL Final  . Microalb Creat Ratio 10/16/2019 1.2  0.0 - 30.0 mg/g Final  . Hemoglobin A1C 10/16/2019 6.9* 4.0 - 5.6 % Final   Labs are all much better.  Philemon Kingdom, MD PhD [facility]

## 2019-11-02 ENCOUNTER — Telehealth: Payer: Self-pay | Admitting: Cardiovascular Disease

## 2019-11-02 ENCOUNTER — Other Ambulatory Visit: Payer: Self-pay

## 2019-11-02 MED ORDER — METOPROLOL TARTRATE 25 MG PO TABS: 25.0000 mg | ORAL_TABLET | Freq: Two times a day (BID) | ORAL | 2 refills | Status: DC

## 2019-11-02 NOTE — Telephone Encounter (Signed)
*  STAT* If patient is at the pharmacy, call can be transferred to refill team.   1. Which medications need to be refilled? (please list name of each medication and dose if known) metoprolol tartrate (LOPRESSOR) 25 MG tablet  2. Which pharmacy/location (including street and city if local pharmacy) is medication to be sent to? [facility] - [place], [place] - [address]  3. Do they need a 30 day or 90 day supply? 90 day  

## 2019-11-12 ENCOUNTER — Encounter: Payer: Self-pay | Admitting: Internal Medicine

## 2019-11-30 DIAGNOSIS — R1013 Epigastric pain: Secondary | ICD-10-CM | POA: Diagnosis not present

## 2019-11-30 DIAGNOSIS — R197 Diarrhea, unspecified: Secondary | ICD-10-CM | POA: Diagnosis not present

## 2019-11-30 DIAGNOSIS — R109 Unspecified abdominal pain: Secondary | ICD-10-CM | POA: Diagnosis not present

## 2019-12-08 DIAGNOSIS — Z1211 Encounter for screening for malignant neoplasm of colon: Secondary | ICD-10-CM | POA: Diagnosis not present

## 2019-12-08 DIAGNOSIS — Z1212 Encounter for screening for malignant neoplasm of rectum: Secondary | ICD-10-CM | POA: Diagnosis not present

## 2019-12-17 DIAGNOSIS — H2513 Age-related nuclear cataract, bilateral: Secondary | ICD-10-CM | POA: Diagnosis not present

## 2019-12-17 DIAGNOSIS — H52223 Regular astigmatism, bilateral: Secondary | ICD-10-CM | POA: Diagnosis not present

## 2019-12-17 DIAGNOSIS — H10413 Chronic giant papillary conjunctivitis, bilateral: Secondary | ICD-10-CM | POA: Diagnosis not present

## 2019-12-17 DIAGNOSIS — H5213 Myopia, bilateral: Secondary | ICD-10-CM | POA: Diagnosis not present

## 2019-12-17 DIAGNOSIS — E119 Type 2 diabetes mellitus without complications: Secondary | ICD-10-CM | POA: Diagnosis not present

## 2019-12-17 DIAGNOSIS — H524 Presbyopia: Secondary | ICD-10-CM | POA: Diagnosis not present

## 2020-01-01 DIAGNOSIS — R1013 Epigastric pain: Secondary | ICD-10-CM | POA: Diagnosis not present

## 2020-01-01 DIAGNOSIS — K228 Other specified diseases of esophagus: Secondary | ICD-10-CM | POA: Diagnosis not present

## 2020-01-01 DIAGNOSIS — K635 Polyp of colon: Secondary | ICD-10-CM | POA: Diagnosis not present

## 2020-01-01 DIAGNOSIS — E119 Type 2 diabetes mellitus without complications: Secondary | ICD-10-CM | POA: Diagnosis not present

## 2020-01-01 DIAGNOSIS — R197 Diarrhea, unspecified: Secondary | ICD-10-CM | POA: Diagnosis not present

## 2020-01-01 DIAGNOSIS — K3189 Other diseases of stomach and duodenum: Secondary | ICD-10-CM | POA: Diagnosis not present

## 2020-01-01 DIAGNOSIS — R12 Heartburn: Secondary | ICD-10-CM | POA: Diagnosis not present

## 2020-01-01 DIAGNOSIS — K529 Noninfective gastroenteritis and colitis, unspecified: Secondary | ICD-10-CM | POA: Diagnosis not present

## 2020-01-01 DIAGNOSIS — K5730 Diverticulosis of large intestine without perforation or abscess without bleeding: Secondary | ICD-10-CM | POA: Diagnosis not present

## 2020-01-01 DIAGNOSIS — K2101 Gastro-esophageal reflux disease with esophagitis, with bleeding: Secondary | ICD-10-CM | POA: Diagnosis not present

## 2020-01-01 DIAGNOSIS — K2950 Unspecified chronic gastritis without bleeding: Secondary | ICD-10-CM | POA: Diagnosis not present

## 2020-01-01 DIAGNOSIS — E785 Hyperlipidemia, unspecified: Secondary | ICD-10-CM | POA: Diagnosis not present

## 2020-01-12 DIAGNOSIS — R197 Diarrhea, unspecified: Secondary | ICD-10-CM | POA: Diagnosis not present

## 2020-01-12 DIAGNOSIS — K589 Irritable bowel syndrome without diarrhea: Secondary | ICD-10-CM | POA: Diagnosis not present

## 2020-02-25 ENCOUNTER — Ambulatory Visit: Payer: Medicare Other | Admitting: Internal Medicine

## 2020-03-07 NOTE — Progress Notes (Signed)
Cardiology Office Note    Date:  03/08/2020   ID:  Debra Knight, DOB [birth year], MRN [id number]  PCP:  Patient, No Pcp Per  Cardiologist: Dr. Johnsie Cancel / Dr. Burt Knack (TAVR)  CC: TAVR/ CAD   History of Present Illness:  Debra Knight is a 74 y.o. female with a history of CAD, HLD, obesity, DJD and severe aortic stenosis s/p TAVR (04/23/17) with Medtronic 29 mm CoreValve Evolut Pro. This was done with general anesthesia and right TF approach. There was no PVL Cath prior to valve did show severe proximal circumflex disease 80% This has been Rx medically. She was thought to be a poor Open surgical candidate due to need for aortic root enlargement poor functional status . She has not had angina She had a normal Non ischemic myovue 08/01/17  Last TTE reviewed 07/09/19 no PVL mean gradient 8 mmHg stable    She was very upset about her DM. Dr Debra Knight started her on glucophage and it has mad her sick " he did not tell me about side effects" Seeing Dr Debra Knight Unable to afford Debra Knight / Debra Knight Trying lower dose ER Metformin A1c 6.9 10/16/19   Husband Debra Knight also a patient of mine had repeat cervical surgery at [facility] with some improvement in UE weakness  No cardiac complaints Needs new glasses On Protonix and Bentyl for her stomach and able to tolerate Glucophage now Needs some dental work Told her we would give her a script for Cleocin to take an hour before any dental work   Past Medical History:  Diagnosis Date  . Aortic stenosis, severe    a. 04/2017: s/p TAVR wtih a Medtronic Evolut-Pro (size 32m, model # Evolutpro29US, serial # [id number]  . CAD (coronary artery disease)    a. pre-TAVR cath 01/2017 showed 80% LCX stenosis. No angina. Plan to treat medically  . Enlarged thyroid   . GERD (gastroesophageal reflux disease)   . Hyperlipidemia   . OSA (obstructive sleep apnea)    no cpap   . Osteoarthritis of both knees   . Tinnitus     Past Surgical History:  Procedure Laterality Date  .  BIOPSY THYROID  2015  . DILATION AND CURETTAGE OF UTERUS    . LEFT HEART CATH AND CORONARY ANGIOGRAPHY N/A 01/15/2017   Procedure: Left Heart Cath and Coronary Angiography;  Surgeon: CSherren Mocha MD;  [facility];  Service: Cardiovascular;  Laterality: N/A;  . TEE WITHOUT CARDIOVERSION N/A 04/23/2017   Procedure: TRANSESOPHAGEAL ECHOCARDIOGRAM (TEE);  Surgeon: CSherren Mocha MD;  [facility]  Service: Open Heart Surgery;  Laterality: N/A;  . TONSILLECTOMY  age 89 . TOTAL KNEE ARTHROPLASTY Right 12/13/2014   Procedure: RIGHT TOTAL KNEE ARTHROPLASTY;  Surgeon: FGaynelle Arabian MD;  [facility];  Service: Orthopedics;  Laterality: Right;  . TOTAL KNEE ARTHROPLASTY Left 02/20/2016   Procedure: LEFT TOTAL KNEE ARTHROPLASTY;  Surgeon: FGaynelle Arabian MD;  [facility]: [facility];  Service: Orthopedics;  Laterality: Left;  . TRANSCATHETER AORTIC VALVE REPLACEMENT, TRANSFEMORAL N/A 04/23/2017   Procedure: TRANSCATHETER AORTIC VALVE REPLACEMENT, TRANSFEMORAL;  Surgeon: CSherren Mocha MD;  [facility]  Service: Open Heart Surgery;  Laterality: N/A;  MEDTRONIC VALVE    Current Medications: Outpatient Medications Prior to Visit  Medication Sig Dispense Refill  . aspirin EC 81 MG tablet Take 81 mg by mouth every evening.    .Marland Kitchenatorvastatin (LIPITOR) 80 MG tablet Take 1 tablet (80 mg total) by mouth at bedtime. 9[place]ualar  tablet 3  . BIOTIN PO Take 1 tablet by mouth daily.    . Blood Glucose Monitoring Suppl (ACCU-CHEK GUIDE) w/Device KIT 1 kit by Does not apply route See admin instructions. Use to check blood sugar 3 times a day 1 kit 0  . clindamycin (CLEOCIN) 300 MG capsule Take 2 tablets by mouth 60 minutes prior to dental appointments    . dicyclomine (BENTYL) 10 MG capsule Take 1 capsule (10 mg total) by mouth 4 (four) times daily as needed for spasms. 40 capsule 0  . glucose blood (ACCU-CHEK GUIDE) test strip Use as instructed to check blood sugar once a day 100 each 11  . metFORMIN  (GLUCOPHAGE-XR) 500 MG 24 hr tablet Take 1 tablet (500 mg total) by mouth in the morning and at bedtime. 180 tablet 3  . metoprolol tartrate (LOPRESSOR) 25 MG tablet Take 1 tablet (25 mg total) by mouth 2 (two) times daily. 180 tablet 2  . Multiple Vitamin (MULTIVITAMIN WITH MINERALS) TABS tablet Take 1 tablet by mouth daily.    . ONE TOUCH LANCETS MISC Check blood sugar three times daily 300 each 12  . pantoprazole (PROTONIX) 40 MG tablet Take 40 mg by mouth every morning.    . nitroGLYCERIN (NITROSTAT) 0.4 MG SL tablet Place 1 tablet (0.4 mg total) under the tongue every 5 (five) minutes as needed for chest pain. (Patient not taking: Reported on 06/03/2018) 25 tablet 3   No facility-administered medications prior to visit.     Allergies:   Advil [ibuprofen], Penicillins, Demerol [meperidine], Other, and Cortisone   Social History   Socioeconomic History  . Marital status: Married    Spouse name: Not on file  . Number of children: 2  . Years of education: Not on file  . Highest education level: Not on file  Occupational History  . Occupation: self employed  Tobacco Use  . Smoking status: Former Smoker    Packs/day: 2.00    Years: 15.00    Pack years: 30.00    Types: Cigarettes    Quit date: 09/03/1968    Years since quitting: 51.5  . Smokeless tobacco: Never Used  . Tobacco comment: 1-2 ppd. , NONE IN 40 YEARS  Vaping Use  . Vaping Use: Never used  Substance and Sexual Activity  . Alcohol use: No  . Drug use: No  . Sexual activity: Not on file  Other Topics Concern  . Not on file  Social History Narrative   Household: pt and husband    Social Determinants of Radio broadcast assistant Strain:   . Difficulty of Paying Living Expenses:   Food Insecurity:   . Worried About Charity fundraiser in the Last Year:   . Arboriculturist in the Last Year:   Transportation Needs:   . Film/video editor (Medical):   Marland Kitchen Lack of Transportation (Non-Medical):   Physical  Activity:   . Days of Exercise per Week:   . Minutes of Exercise per Session:   Stress:   . Feeling of Stress :   Social Connections:   . Frequency of Communication with Friends and Family:   . Frequency of Social Gatherings with Friends and Family:   . Attends Religious Services:   . Active Member of Clubs or Organizations:   . Attends Archivist Meetings:   Marland Kitchen Marital Status:      Family History:  The patient's family history includes Diabetes in her maternal grandfather and maternal  uncle; Hashimoto's thyroiditis in her daughter and son; Heart attack (age of onset: 48) in her mother; Heart disease in her mother; Prostate cancer in her father.      ROS:   Please see the history of present illness.    ROS All other systems reviewed and are negative.   PHYSICAL EXAM:   VS:  BP 122/68   Pulse 61   Ht 5' 1"  (1.549 m)   Wt 237 lb (107.5 kg)   SpO2 99%   BMI 44.78 kg/m    Affect appropriate Obese white female  HEENT: normal Neck supple with no adenopathy JVP normal no bruits no thyromegaly Lungs clear with no wheezing and good diaphragmatic motion Heart:  S1/S2 SEM  murmur, no rub, gallop or click PMI normal Abdomen: benighn, BS positve, no tenderness, no AAA no bruit.  No HSM or HJR Distal pulses intact with no bruits No edema Neuro non-focal Skin warm and dry Post bilateral TKR     Wt Readings from Last 3 Encounters:  03/08/20 237 lb (107.5 kg)  10/16/19 237 lb (107.5 kg)  07/14/19 246 lb (111.6 kg)      Studies/Labs Reviewed:   EKG:  EKG is NOT ordered today.  Recent Labs: 10/16/2019: ALT 22; BUN 12; Creat 0.68; Potassium 3.8; Sodium 142   Lipid Panel    Component Value Date/Time   CHOL 146 10/16/2019 1500   TRIG 153.0 (H) 10/16/2019 1500   HDL 41.00 10/16/2019 1500   CHOLHDL 4 10/16/2019 1500   VLDL 30.6 10/16/2019 1500   LDLCALC 75 10/16/2019 1500   LDLDIRECT 120.0 06/03/2018 1411    Additional studies/ records that were reviewed  today include:  04/23/17 Procedure:  Transcatheter Aortic Valve Replacement - Percutaneous RightTransfemoral Approach Medtronic Evolut-Pro (size 69m, model # Evolutpro29US, serial # [id number]  Co-Surgeons:Bryan KAlveria Apley MD and MSherren Mocha MD   Anesthesiologist:Terry MOrene Desanctis MD  EDala Dock MD  Pre-operative Echo Findings: ? Severe aortic stenosis and moderate AI ? Normalleft ventricular systolic function  Post-operative Echo Findings: ? trivialparavalvular leak ? Normalleft ventricular systolic function ? Normal prosthetic transvalvular gradient of 84mHg.  _____________  Echo 07/09/19  IMPRESSIONS    1. Left ventricular ejection fraction, by visual estimation, is 60 to  65%. The left ventricle has normal function. Mildly increased left  ventricular posterior wall thickness. There is severely increased left  ventricular hypertrophy of the basal septum.  2. Elevated left ventricular end-diastolic pressure.  3. Left ventricular diastolic parameters are consistent with Grade I  diastolic dysfunction (impaired relaxation).  4. Global right ventricle has normal systolic function.The right  ventricular size is normal. No increase in right ventricular wall  thickness.  5. Left atrial size was mildly dilated.  6. Right atrial size was normal.  7. Severe mitral annular calcification.  8. The mitral valve is normal in structure. No evidence of mitral valve  regurgitation. No evidence of mitral stenosis.  9. The tricuspid valve is normal in structure. Tricuspid valve  regurgitation is not demonstrated.  10. 2931moreValve-Evolut Pro bioprosthetic, stented aortic valve (TAVR)  valve is present in the aortic position. There is trivial perivalvular AI.  Aortic valve mean gradient measures 8.0 mmHg. Aortic valve peak gradient  measures 16.1 mmHg. The  dimensionless  index is 0.76.  11. The pulmonic valve was normal in structure. Pulmonic valve  regurgitation is not visualized.  12. The inferior vena cava is normal in size with greater than 50%  respiratory variability, suggesting right atrial  pressure of 3 mmHg.  13. There is right bowing of the interatrial septum, suggestive of  elevated left atrial pressure.     ASSESSMENT & PLAN:   Severe AS s/p TAVR:  NHYA class 1. Post Evolut Pro 29 mm Medtronic CoreValve stable gradients no PVL mean gradient 8 peak 16 07/09/19    HTN: Well controlled.  Continue current medications and low sodium Dash type diet.    HLD: continue statin   CAD: continue asa, statin and BB circumflex lesion is very tight no angina and normal myovue 09/05/18 observe   LBBB: we discussed how this is common after TAVR no high grade AV block Lexiscan for stress testing   DM:  Intolerant to Glucophage with severe GI distress f/u Dr Debra Knight for alternative ? To start Tilda Burrow

## 2020-03-08 ENCOUNTER — Other Ambulatory Visit: Payer: Self-pay

## 2020-03-08 ENCOUNTER — Encounter: Payer: Self-pay | Admitting: Cardiovascular Disease

## 2020-03-08 ENCOUNTER — Ambulatory Visit (INDEPENDENT_AMBULATORY_CARE_PROVIDER_SITE_OTHER): Payer: Medicare Other | Admitting: Cardiovascular Disease

## 2020-03-08 VITALS — BP 122/68 | HR 61 | Ht 61.0 in | Wt 237.0 lb

## 2020-03-08 DIAGNOSIS — I350 Nonrheumatic aortic (valve) stenosis: Secondary | ICD-10-CM

## 2020-03-08 DIAGNOSIS — Z952 Presence of prosthetic heart valve: Secondary | ICD-10-CM | POA: Diagnosis not present

## 2020-03-08 DIAGNOSIS — R9431 Abnormal electrocardiogram [ECG] [EKG]: Secondary | ICD-10-CM | POA: Diagnosis not present

## 2020-03-08 NOTE — Patient Instructions (Addendum)
Medication Instructions:  *If you need a refill on your cardiac medications before your next appointment, please call your pharmacy*  Lab Work: If you have labs (blood work) drawn today and your tests are completely normal, you will receive your results only by: Marland Kitchen MyChart Message (if you have MyChart) OR . A paper copy in the mail If you have any lab test that is abnormal or we need to change your treatment, we will call you to review the results.  Testing/Procedures: Your physician has requested that you have an echocardiogram in 6 months. Echocardiography is a painless test that uses sound waves to create images of your heart. It provides your doctor with information about the size and shape of your heart and how well your heart's chambers and valves are working. This procedure takes approximately one hour. There are no restrictions for this procedure.  Your physician has requested that you have a lexiscan myoview in 6 months. For further information please visit [email or web address]. Please follow instruction sheet, as given.  Follow-Up: At [facility], you and your health needs are our priority.  As part of our continuing mission to provide you with exceptional heart care, we have created designated Provider Care Teams.  These Care Teams include your primary Cardiologist (physician) and Advanced Practice Providers (APPs -  Physician Assistants and Nurse Practitioners) who all work together to provide you with the care you need, when you need it.  We recommend signing up for the patient portal called "MyChart".  Sign up information is provided on this After Visit Summary.  MyChart is used to connect with patients for Virtual Visits (Telemedicine).  Patients are able to view lab/test results, encounter notes, upcoming appointments, etc.  Non-urgent messages can be sent to your provider as well.   To learn more about what you can do with MyChart, go to [email or web address].    Your next  appointment:   6 month(s)  The format for your next appointment:   In Person  Provider:   You may see Jenkins Rouge, MD or one of the following Advanced Practice Providers on your designated Care Team:    Truitt Merle, NP  Cecilie Kicks, NP  Kathyrn Drown, NP

## 2020-04-12 DIAGNOSIS — M545 Low back pain: Secondary | ICD-10-CM | POA: Diagnosis not present

## 2020-04-12 DIAGNOSIS — M549 Dorsalgia, unspecified: Secondary | ICD-10-CM | POA: Diagnosis not present

## 2020-04-12 DIAGNOSIS — K589 Irritable bowel syndrome without diarrhea: Secondary | ICD-10-CM | POA: Diagnosis not present

## 2020-06-03 DIAGNOSIS — Z23 Encounter for immunization: Secondary | ICD-10-CM | POA: Diagnosis not present

## 2020-06-27 ENCOUNTER — Other Ambulatory Visit: Payer: Self-pay | Admitting: Cardiovascular Disease

## 2020-07-27 ENCOUNTER — Other Ambulatory Visit: Payer: Self-pay | Admitting: Cardiovascular Disease

## 2020-08-23 ENCOUNTER — Telehealth: Payer: Self-pay

## 2020-08-23 DIAGNOSIS — R079 Chest pain, unspecified: Secondary | ICD-10-CM

## 2020-08-23 NOTE — Telephone Encounter (Signed)
Will route to Dr. Johnsie Cancel to sign below.

## 2020-08-23 NOTE — Telephone Encounter (Signed)
-----   Message from Frederic Jericho sent at 08/22/2020 12:21 PM EST ----- Regarding: ATTESTATION Please have ATTESTATION created and signed by MD.. Thank you!

## 2020-09-20 NOTE — Progress Notes (Signed)
Cardiology Office Note    Date:  09/29/2020   ID:  Debra Knight, DOB [birth year], MRN [id number]  PCP:  Patient, No Pcp Per  Cardiologist: Dr. Johnsie Cancel / Dr. Burt Knack (TAVR)  CC: TAVR/ CAD   History of Present Illness:  Debra Knight is a 75 y.o. female with a history of CAD, HLD, obesity, DJD and severe aortic stenosis s/p TAVR (04/23/17) with Medtronic 29 mm CoreValve Evolut Pro. This was done with general anesthesia and right TF approach. There was no PVL Cath prior to valve did show severe proximal circumflex disease 80% This has been Rx medically. She was thought to be a poor Open surgical candidate due to need for aortic root enlargement poor functional status . She has not had angina She had a normal Non ischemic myovue 08/01/17  Last TTE reviewed 07/09/19 no PVL mean gradient 8 mmHg stable    She was very upset about her DM. Dr Larose Kells started her on glucophage and it has mad her sick " he did not tell me about side effects" Seeing Dr Gardenia Phlegm Unable to afford Vania Rea / Wilder Glade Trying lower dose ER Metformin A1c 6.9 10/16/19   Husband Debra Knight also a patient of mine had repeat cervical surgery at [facility] with some improvement in UE weakness  TTE today reviewed mild PVL normal EF mean gradient 6 mmHg  Myovue is pending   Needs to see dentist   Past Medical History:  Diagnosis Date  . Aortic stenosis, severe    a. 04/2017: s/p TAVR wtih a Medtronic Evolut-Pro (size 49m, model # Evolutpro29US, serial # [id number]  . CAD (coronary artery disease)    a. pre-TAVR cath 01/2017 showed 80% LCX stenosis. No angina. Plan to treat medically  . Enlarged thyroid   . GERD (gastroesophageal reflux disease)   . Hyperlipidemia   . OSA (obstructive sleep apnea)    no cpap   . Osteoarthritis of both knees   . Tinnitus     Past Surgical History:  Procedure Laterality Date  . BIOPSY THYROID  2015  . DILATION AND CURETTAGE OF UTERUS    . LEFT HEART CATH AND CORONARY ANGIOGRAPHY N/A 01/15/2017    Procedure: Left Heart Cath and Coronary Angiography;  Surgeon: CSherren Mocha MD;  [facility];  Service: Cardiovascular;  Laterality: N/A;  . TEE WITHOUT CARDIOVERSION N/A 04/23/2017   Procedure: TRANSESOPHAGEAL ECHOCARDIOGRAM (TEE);  Surgeon: CSherren Mocha MD;  [facility]  Service: Open Heart Surgery;  Laterality: N/A;  . TONSILLECTOMY  age 75 . TOTAL KNEE ARTHROPLASTY Right 12/13/2014   Procedure: RIGHT TOTAL KNEE ARTHROPLASTY;  Surgeon: FGaynelle Arabian MD;  [facility];  Service: Orthopedics;  Laterality: Right;  . TOTAL KNEE ARTHROPLASTY Left 02/20/2016   Procedure: LEFT TOTAL KNEE ARTHROPLASTY;  Surgeon: FGaynelle Arabian MD;  [facility];  Service: Orthopedics;  Laterality: Left;  . TRANSCATHETER AORTIC VALVE REPLACEMENT, TRANSFEMORAL N/A 04/23/2017   Procedure: TRANSCATHETER AORTIC VALVE REPLACEMENT, TRANSFEMORAL;  Surgeon: CSherren Mocha MD;  [facility]  Service: Open Heart Surgery;  Laterality: N/A;  MEDTRONIC VALVE    Current Medications: Outpatient Medications Prior to Visit  Medication Sig Dispense Refill  . aspirin EC 81 MG tablet Take 81 mg by mouth every evening.    .Marland Kitchenatorvastatin (LIPITOR) 80 MG tablet TAKE 1 TABLET BY MOUTH AT BEDTIME 90 tablet 2  . Blood Glucose Monitoring Suppl (ACCU-CHEK GUIDE) w/Device KIT 1 kit by Does not apply route See admin instructions. Use  to check blood sugar 3 times a day 1 kit 0  . clindamycin (CLEOCIN) 300 MG capsule Take 2 tablets by mouth 60 minutes prior to dental appointments    . dicyclomine (BENTYL) 10 MG capsule Take 1 capsule (10 mg total) by mouth 4 (four) times daily as needed for spasms. 40 capsule 0  . glucose blood (ACCU-CHEK GUIDE) test strip Use as instructed to check blood sugar once a day 100 each 11  . metFORMIN (GLUCOPHAGE-XR) 500 MG 24 hr tablet Take 1 tablet (500 mg total) by mouth in the morning and at bedtime. 180 tablet 3  . metoprolol tartrate (LOPRESSOR) 25 MG tablet Take 1 tablet  by mouth twice daily 180 tablet 2  . Multiple Vitamin (MULTIVITAMIN WITH MINERALS) TABS tablet Take 1 tablet by mouth daily.    . nitroGLYCERIN (NITROSTAT) 0.4 MG SL tablet Place 1 tablet (0.4 mg total) under the tongue every 5 (five) minutes as needed for chest pain. 25 tablet 3  . ONE TOUCH LANCETS MISC Check blood sugar three times daily 300 each 12  . pantoprazole (PROTONIX) 40 MG tablet Take 40 mg by mouth every morning.    Marland Kitchen BIOTIN PO Take 1 tablet by mouth daily.     No facility-administered medications prior to visit.     Allergies:   Advil [ibuprofen], Penicillins, Demerol [meperidine], Other, and Cortisone   Social History   Socioeconomic History  . Marital status: Married    Spouse name: Not on file  . Number of children: 2  . Years of education: Not on file  . Highest education level: Not on file  Occupational History  . Occupation: self employed  Tobacco Use  . Smoking status: Former Smoker    Packs/day: 2.00    Years: 15.00    Pack years: 30.00    Types: Cigarettes    Quit date: 09/03/1968    Years since quitting: 52.1  . Smokeless tobacco: Never Used  . Tobacco comment: 1-2 ppd. , NONE IN 40 YEARS  Vaping Use  . Vaping Use: Never used  Substance and Sexual Activity  . Alcohol use: No  . Drug use: No  . Sexual activity: Not on file  Other Topics Concern  . Not on file  Social History Narrative   Household: pt and husband    Social Determinants of Radio broadcast assistant Strain: Not on file  Food Insecurity: Not on file  Transportation Needs: Not on file  Physical Activity: Not on file  Stress: Not on file  Social Connections: Not on file     Family History:  The patient's family history includes Diabetes in her maternal grandfather and maternal uncle; Hashimoto's thyroiditis in her daughter and son; Heart attack (age of onset: 31) in her mother; Heart disease in her mother; Prostate cancer in her father.      ROS:   Please see the history of  present illness.    ROS All other systems reviewed and are negative.   PHYSICAL EXAM:   VS:  BP 134/60   Pulse 63   Ht 5' 1"  (1.549 m)   Wt 101.2 kg   SpO2 96%   BMI 42.14 kg/m    Affect appropriate Obese white female  HEENT: normal Neck supple with no adenopathy JVP normal no bruits no thyromegaly Lungs clear with no wheezing and good diaphragmatic motion Heart:  S1/S2 SEM  murmur, no rub, gallop or click PMI normal Abdomen: benighn, BS positve, no tenderness, no AAA  no bruit.  No HSM or HJR Distal pulses intact with no bruits post RFA cut down for TAVR  No edema Neuro non-focal Skin warm and dry Post bilateral TKR     Wt Readings from Last 3 Encounters:  09/29/20 101.2 kg  09/22/20 107.5 kg  03/08/20 107.5 kg      Studies/Labs Reviewed:   EKG:  09/29/2020 SR rate 63 normal   Recent Labs: 10/16/2019: ALT 22; BUN 12; Creat 0.68; Potassium 3.8; Sodium 142   Lipid Panel    Component Value Date/Time   CHOL 146 10/16/2019 1500   TRIG 153.0 (H) 10/16/2019 1500   HDL 41.00 10/16/2019 1500   CHOLHDL 4 10/16/2019 1500   VLDL 30.6 10/16/2019 1500   LDLCALC 75 10/16/2019 1500   LDLDIRECT 120.0 06/03/2018 1411    Additional studies/ records that were reviewed today include:  04/23/17 Procedure:  Transcatheter Aortic Valve Replacement - Percutaneous RightTransfemoral Approach Medtronic Evolut-Pro (size 34m, model # Evolutpro29US, serial # [id number]  Co-Surgeons:Bryan KAlveria Apley MD and MSherren Mocha MD   Anesthesiologist:Terry MOrene Desanctis MD  EDala Dock MD  Pre-operative Echo Findings: ? Severe aortic stenosis and moderate AI ? Normalleft ventricular systolic function  Post-operative Echo Findings: ? trivialparavalvular leak ? Normalleft ventricular systolic function ? Normal prosthetic transvalvular gradient of 844mHg.  _____________  Echo 07/09/19   IMPRESSIONS    1. Left ventricular ejection fraction, by visual estimation, is 60 to  65%. The left ventricle has normal function. Mildly increased left  ventricular posterior wall thickness. There is severely increased left  ventricular hypertrophy of the basal septum.  2. Elevated left ventricular end-diastolic pressure.  3. Left ventricular diastolic parameters are consistent with Grade I  diastolic dysfunction (impaired relaxation).  4. Global right ventricle has normal systolic function.The right  ventricular size is normal. No increase in right ventricular wall  thickness.  5. Left atrial size was mildly dilated.  6. Right atrial size was normal.  7. Severe mitral annular calcification.  8. The mitral valve is normal in structure. No evidence of mitral valve  regurgitation. No evidence of mitral stenosis.  9. The tricuspid valve is normal in structure. Tricuspid valve  regurgitation is not demonstrated.  10. 2953moreValve-Evolut Pro bioprosthetic, stented aortic valve (TAVR)  valve is present in the aortic position. There is trivial perivalvular AI.  Aortic valve mean gradient measures 8.0 mmHg. Aortic valve peak gradient  measures 16.1 mmHg. The  dimensionless index is 0.76.  11. The pulmonic valve was normal in structure. Pulmonic valve  regurgitation is not visualized.  12. The inferior vena cava is normal in size with greater than 50%  respiratory variability, suggesting right atrial pressure of 3 mmHg.  13. There is right bowing of the interatrial septum, suggestive of  elevated left atrial pressure.     ASSESSMENT & PLAN:   Severe AS s/p TAVR:  NHYA class 1. Post Evolut Pro 29 mm Medtronic CoreValve stable gradients mean 6 mild PVL 09/29/2020   HTN: Well controlled.  Continue current medications and low sodium Dash type diet.    HLD: continue statin   CAD: continue asa, statin and BB circumflex lesion is very tight no angina and normal myovue 09/05/18  observe   LBBB: we discussed how this is common after TAVR no high grade AV block Lexiscan for stress testing   DM:  Intolerant to Glucophage with severe GI distress f/u Dr PazLarose Kellsr alternative ? To start JarTilda Knight

## 2020-09-21 ENCOUNTER — Telehealth (HOSPITAL_COMMUNITY): Payer: Self-pay

## 2020-09-21 NOTE — Telephone Encounter (Signed)
Spoke with the patient, detailed instructions given. She stated she understood and would be here for her test. Asked to call back with any questions. S.Diavion Labrador EMTP ?

## 2020-09-22 ENCOUNTER — Other Ambulatory Visit: Payer: Self-pay

## 2020-09-22 ENCOUNTER — Ambulatory Visit (HOSPITAL_COMMUNITY): Payer: Medicare Other | Attending: Cardiology

## 2020-09-22 DIAGNOSIS — R9431 Abnormal electrocardiogram [ECG] [EKG]: Secondary | ICD-10-CM | POA: Insufficient documentation

## 2020-09-22 MED ORDER — REGADENOSON 0.4 MG/5ML IV SOLN
0.4000 mg | Freq: Once | INTRAVENOUS | Status: AC
Start: 2020-09-22 — End: 2020-09-22
  Administered 2020-09-22: 0.4 mg via INTRAVENOUS

## 2020-09-22 MED ORDER — TECHNETIUM TC 99M TETROFOSMIN IV KIT
32.8000 | PACK | Freq: Once | INTRAVENOUS | Status: AC | PRN
  Administered 2020-09-22: 32.8 via INTRAVENOUS
  Filled 2020-09-22: qty 33

## 2020-09-29 ENCOUNTER — Other Ambulatory Visit: Payer: Self-pay

## 2020-09-29 ENCOUNTER — Ambulatory Visit (INDEPENDENT_AMBULATORY_CARE_PROVIDER_SITE_OTHER): Payer: Medicare Other | Admitting: Cardiovascular Disease

## 2020-09-29 ENCOUNTER — Encounter: Payer: Self-pay | Admitting: Cardiovascular Disease

## 2020-09-29 ENCOUNTER — Ambulatory Visit (HOSPITAL_COMMUNITY): Payer: Medicare Other | Attending: Internal Medicine

## 2020-09-29 ENCOUNTER — Ambulatory Visit (HOSPITAL_COMMUNITY): Payer: Medicare Other

## 2020-09-29 VITALS — BP 134/60 | HR 63 | Ht 61.0 in | Wt 223.0 lb

## 2020-09-29 DIAGNOSIS — Z952 Presence of prosthetic heart valve: Secondary | ICD-10-CM

## 2020-09-29 DIAGNOSIS — I350 Nonrheumatic aortic (valve) stenosis: Secondary | ICD-10-CM | POA: Insufficient documentation

## 2020-09-29 DIAGNOSIS — R9431 Abnormal electrocardiogram [ECG] [EKG]: Secondary | ICD-10-CM | POA: Insufficient documentation

## 2020-09-29 LAB — ECHOCARDIOGRAM COMPLETE
AR max vel: 2.88 cm2
AV Area VTI: 2.84 cm2
AV Area mean vel: 2.76 cm2
AV Mean grad: 7 mmHg
AV Peak grad: 10.3 mmHg
Ao pk vel: 1.61 m/s
Area-P 1/2: 1.48 cm2
S' Lateral: 2 cm

## 2020-09-29 LAB — MYOCARDIAL PERFUSION IMAGING
LV dias vol: 54 mL (ref 46–106)
LV sys vol: 11 mL
Peak HR: 85 {beats}/min
Rest HR: 60 {beats}/min
SDS: 3
SRS: 1
SSS: 4
TID: 0.99

## 2020-09-29 MED ORDER — TECHNETIUM TC 99M TETROFOSMIN IV KIT
28.0000 | PACK | Freq: Once | INTRAVENOUS | Status: AC | PRN
  Administered 2020-09-29: 28 via INTRAVENOUS
  Filled 2020-09-29: qty 28

## 2020-09-29 NOTE — Patient Instructions (Signed)

## 2020-10-25 ENCOUNTER — Other Ambulatory Visit: Payer: Self-pay | Admitting: *Deleted

## 2020-10-25 MED ORDER — CLINDAMYCIN HCL 300 MG PO CAPS: 600.0000 mg | ORAL_CAPSULE | ORAL | 3 refills | Status: AC

## 2020-12-22 DIAGNOSIS — H2513 Age-related nuclear cataract, bilateral: Secondary | ICD-10-CM | POA: Diagnosis not present

## 2020-12-22 DIAGNOSIS — D3131 Benign neoplasm of right choroid: Secondary | ICD-10-CM | POA: Diagnosis not present

## 2020-12-22 DIAGNOSIS — H10413 Chronic giant papillary conjunctivitis, bilateral: Secondary | ICD-10-CM | POA: Diagnosis not present

## 2020-12-22 DIAGNOSIS — H43812 Vitreous degeneration, left eye: Secondary | ICD-10-CM | POA: Diagnosis not present

## 2020-12-22 DIAGNOSIS — H43391 Other vitreous opacities, right eye: Secondary | ICD-10-CM | POA: Diagnosis not present

## 2020-12-26 ENCOUNTER — Other Ambulatory Visit: Payer: Self-pay | Admitting: Internal Medicine

## 2021-01-11 DIAGNOSIS — H35363 Drusen (degenerative) of macula, bilateral: Secondary | ICD-10-CM | POA: Diagnosis not present

## 2021-01-11 DIAGNOSIS — H25813 Combined forms of age-related cataract, bilateral: Secondary | ICD-10-CM | POA: Diagnosis not present

## 2021-01-11 DIAGNOSIS — D3131 Benign neoplasm of right choroid: Secondary | ICD-10-CM | POA: Diagnosis not present

## 2021-02-13 ENCOUNTER — Other Ambulatory Visit: Payer: Self-pay | Admitting: Cardiovascular Disease

## 2021-02-13 DIAGNOSIS — R197 Diarrhea, unspecified: Secondary | ICD-10-CM | POA: Diagnosis not present

## 2021-02-13 DIAGNOSIS — R109 Unspecified abdominal pain: Secondary | ICD-10-CM | POA: Diagnosis not present

## 2021-02-13 DIAGNOSIS — K589 Irritable bowel syndrome without diarrhea: Secondary | ICD-10-CM | POA: Diagnosis not present

## 2021-04-12 DIAGNOSIS — H35363 Drusen (degenerative) of macula, bilateral: Secondary | ICD-10-CM | POA: Diagnosis not present

## 2021-04-12 DIAGNOSIS — D3131 Benign neoplasm of right choroid: Secondary | ICD-10-CM | POA: Diagnosis not present

## 2021-04-12 DIAGNOSIS — H21512 Anterior synechiae (iris), left eye: Secondary | ICD-10-CM | POA: Diagnosis not present

## 2021-04-12 DIAGNOSIS — H25813 Combined forms of age-related cataract, bilateral: Secondary | ICD-10-CM | POA: Diagnosis not present

## 2021-04-18 ENCOUNTER — Other Ambulatory Visit: Payer: Self-pay | Admitting: Cardiovascular Disease

## 2021-04-18 DIAGNOSIS — R1084 Generalized abdominal pain: Secondary | ICD-10-CM | POA: Diagnosis not present

## 2021-04-18 DIAGNOSIS — K589 Irritable bowel syndrome without diarrhea: Secondary | ICD-10-CM | POA: Diagnosis not present

## 2021-04-18 DIAGNOSIS — R197 Diarrhea, unspecified: Secondary | ICD-10-CM | POA: Diagnosis not present

## 2021-04-20 ENCOUNTER — Other Ambulatory Visit: Payer: Self-pay

## 2021-04-20 ENCOUNTER — Telehealth: Payer: Self-pay

## 2021-04-20 DIAGNOSIS — R159 Full incontinence of feces: Secondary | ICD-10-CM

## 2021-04-20 NOTE — Telephone Encounter (Addendum)
Referral for anorectal manometry.  Provider Dr Elease Hashimoto at [facility].  [place], [place] [address] [phone] Fax [phone] Patient contacted and instructed for procedure.

## 2021-05-03 ENCOUNTER — Ambulatory Visit (HOSPITAL_COMMUNITY)
Admission: RE | Admit: 2021-05-03 | Discharge: 2021-05-03 | Disposition: A | Discharge status: Home / Self Care | Payer: Medicare Other | Attending: Gastroenterology | Admitting: Gastroenterology

## 2021-05-03 ENCOUNTER — Encounter (HOSPITAL_COMMUNITY)
Admission: RE | Disposition: A | Discharge status: Home / Self Care | Payer: Self-pay | Source: Home / Self Care | Attending: Gastroenterology

## 2021-05-03 DIAGNOSIS — K6289 Other specified diseases of anus and rectum: Secondary | ICD-10-CM | POA: Diagnosis not present

## 2021-05-03 DIAGNOSIS — R159 Full incontinence of feces: Secondary | ICD-10-CM | POA: Insufficient documentation

## 2021-05-03 HISTORY — PX: ANAL RECTAL MANOMETRY: SHX6358

## 2021-05-03 SURGERY — MANOMETRY, ANORECTAL

## 2021-05-03 NOTE — Progress Notes (Signed)
Anal manometry done per protocol. Pt tolerated well without distress or complication   

## 2021-05-05 ENCOUNTER — Encounter (HOSPITAL_COMMUNITY): Payer: Self-pay | Admitting: Gastroenterology

## 2021-05-09 DIAGNOSIS — R159 Full incontinence of feces: Secondary | ICD-10-CM

## 2021-05-12 ENCOUNTER — Telehealth: Payer: Self-pay

## 2021-05-12 NOTE — Telephone Encounter (Signed)
Anorectal manometry report faxed to [facility] Dr Vickki Muff.

## 2021-05-31 DIAGNOSIS — M6281 Muscle weakness (generalized): Secondary | ICD-10-CM | POA: Diagnosis not present

## 2021-05-31 DIAGNOSIS — R933 Abnormal findings on diagnostic imaging of other parts of digestive tract: Secondary | ICD-10-CM | POA: Diagnosis not present

## 2021-05-31 DIAGNOSIS — R159 Full incontinence of feces: Secondary | ICD-10-CM | POA: Diagnosis not present

## 2021-05-31 DIAGNOSIS — M6289 Other specified disorders of muscle: Secondary | ICD-10-CM | POA: Diagnosis not present

## 2021-06-14 DIAGNOSIS — R159 Full incontinence of feces: Secondary | ICD-10-CM | POA: Diagnosis not present

## 2021-06-14 DIAGNOSIS — M6289 Other specified disorders of muscle: Secondary | ICD-10-CM | POA: Diagnosis not present

## 2021-06-14 DIAGNOSIS — M6281 Muscle weakness (generalized): Secondary | ICD-10-CM | POA: Diagnosis not present

## 2021-06-21 DIAGNOSIS — R159 Full incontinence of feces: Secondary | ICD-10-CM | POA: Diagnosis not present

## 2021-06-21 DIAGNOSIS — M6289 Other specified disorders of muscle: Secondary | ICD-10-CM | POA: Diagnosis not present

## 2021-06-21 DIAGNOSIS — M6281 Muscle weakness (generalized): Secondary | ICD-10-CM | POA: Diagnosis not present

## 2021-06-28 DIAGNOSIS — M6289 Other specified disorders of muscle: Secondary | ICD-10-CM | POA: Diagnosis not present

## 2021-06-28 DIAGNOSIS — M6281 Muscle weakness (generalized): Secondary | ICD-10-CM | POA: Diagnosis not present

## 2021-06-28 DIAGNOSIS — R159 Full incontinence of feces: Secondary | ICD-10-CM | POA: Diagnosis not present

## 2021-07-05 DIAGNOSIS — M6281 Muscle weakness (generalized): Secondary | ICD-10-CM | POA: Diagnosis not present

## 2021-07-05 DIAGNOSIS — M6289 Other specified disorders of muscle: Secondary | ICD-10-CM | POA: Diagnosis not present

## 2021-07-05 DIAGNOSIS — R159 Full incontinence of feces: Secondary | ICD-10-CM | POA: Diagnosis not present

## 2021-07-05 DIAGNOSIS — R933 Abnormal findings on diagnostic imaging of other parts of digestive tract: Secondary | ICD-10-CM | POA: Diagnosis not present

## 2021-07-12 DIAGNOSIS — R933 Abnormal findings on diagnostic imaging of other parts of digestive tract: Secondary | ICD-10-CM | POA: Diagnosis not present

## 2021-07-12 DIAGNOSIS — R159 Full incontinence of feces: Secondary | ICD-10-CM | POA: Diagnosis not present

## 2021-07-12 DIAGNOSIS — M6281 Muscle weakness (generalized): Secondary | ICD-10-CM | POA: Diagnosis not present

## 2021-07-12 DIAGNOSIS — M6289 Other specified disorders of muscle: Secondary | ICD-10-CM | POA: Diagnosis not present

## 2021-07-19 DIAGNOSIS — K589 Irritable bowel syndrome without diarrhea: Secondary | ICD-10-CM | POA: Diagnosis not present

## 2021-07-20 DIAGNOSIS — Z23 Encounter for immunization: Secondary | ICD-10-CM | POA: Diagnosis not present

## 2021-07-21 DIAGNOSIS — R159 Full incontinence of feces: Secondary | ICD-10-CM | POA: Diagnosis not present

## 2021-07-21 DIAGNOSIS — R933 Abnormal findings on diagnostic imaging of other parts of digestive tract: Secondary | ICD-10-CM | POA: Diagnosis not present

## 2021-07-21 DIAGNOSIS — M6281 Muscle weakness (generalized): Secondary | ICD-10-CM | POA: Diagnosis not present

## 2021-07-21 DIAGNOSIS — M6289 Other specified disorders of muscle: Secondary | ICD-10-CM | POA: Diagnosis not present

## 2021-07-25 ENCOUNTER — Other Ambulatory Visit: Payer: Self-pay | Admitting: Cardiovascular Disease

## 2021-07-26 DIAGNOSIS — M6281 Muscle weakness (generalized): Secondary | ICD-10-CM | POA: Diagnosis not present

## 2021-07-26 DIAGNOSIS — R933 Abnormal findings on diagnostic imaging of other parts of digestive tract: Secondary | ICD-10-CM | POA: Diagnosis not present

## 2021-07-26 DIAGNOSIS — M6289 Other specified disorders of muscle: Secondary | ICD-10-CM | POA: Diagnosis not present

## 2021-07-26 DIAGNOSIS — R159 Full incontinence of feces: Secondary | ICD-10-CM | POA: Diagnosis not present

## 2021-08-02 DIAGNOSIS — R933 Abnormal findings on diagnostic imaging of other parts of digestive tract: Secondary | ICD-10-CM | POA: Diagnosis not present

## 2021-08-02 DIAGNOSIS — M6289 Other specified disorders of muscle: Secondary | ICD-10-CM | POA: Diagnosis not present

## 2021-08-02 DIAGNOSIS — R159 Full incontinence of feces: Secondary | ICD-10-CM | POA: Diagnosis not present

## 2021-08-02 DIAGNOSIS — M6281 Muscle weakness (generalized): Secondary | ICD-10-CM | POA: Diagnosis not present

## 2021-09-21 DIAGNOSIS — U071 COVID-19: Secondary | ICD-10-CM | POA: Diagnosis not present

## 2021-10-26 DIAGNOSIS — D3131 Benign neoplasm of right choroid: Secondary | ICD-10-CM | POA: Diagnosis not present

## 2021-10-26 DIAGNOSIS — H25813 Combined forms of age-related cataract, bilateral: Secondary | ICD-10-CM | POA: Diagnosis not present

## 2021-10-26 DIAGNOSIS — H35363 Drusen (degenerative) of macula, bilateral: Secondary | ICD-10-CM | POA: Diagnosis not present

## 2021-11-15 ENCOUNTER — Other Ambulatory Visit: Payer: Self-pay | Admitting: *Deleted

## 2021-11-15 ENCOUNTER — Encounter: Payer: Self-pay | Admitting: Cardiovascular Disease

## 2021-11-15 MED ORDER — ATORVASTATIN CALCIUM 80 MG PO TABS: ORAL_TABLET | ORAL | 0 refills | Status: DC

## 2021-11-15 MED ORDER — METOPROLOL TARTRATE 25 MG PO TABS: 25.0000 mg | ORAL_TABLET | Freq: Two times a day (BID) | ORAL | 0 refills | Status: DC

## 2021-11-29 ENCOUNTER — Telehealth: Payer: Self-pay | Admitting: Cardiovascular Disease

## 2021-11-29 NOTE — Telephone Encounter (Signed)
Having issues with her meds and want the nurse to call her back. Please advise ?

## 2021-11-29 NOTE — Telephone Encounter (Addendum)
Patient is calling about rescheduling her appointment at a sooner time. Made her an appointment next week. ?

## 2021-12-05 NOTE — Progress Notes (Signed)
? ?Cardiology Office Note   ? ?Date:  12/08/2021  ? ?ID:  Debra Knight, DOB November 02, 1945, MRN [id number] ? ?PCP:  Patient, No Pcp Per (Inactive)  ?Cardiologist: Dr. Johnsie Cancel / Dr. Burt Knack (TAVR) ? ?CC: TAVR/ CAD  ? ?History of Present Illness:  ?Debra Knight is a 76 y.o. female with a history of CAD, HLD, obesity, DJD and severe aortic stenosis s/p TAVR (04/23/17) with Medtronic 29 mm CoreValve Evolut Pro. This was done with general anesthesia and right TF approach. There was no PVL Cath prior to valve did show severe proximal circumflex disease 80% This has been Rx medically. She was thought to be a poor ?Open surgical candidate due to need for aortic root enlargement poor functional status . She has not had angina She had a normal Non ischemic myovue 08/01/17  Last TTE reviewed 07/09/19 no PVL mean gradient 8 mmHg stable   ? ?She was very upset about her DM. Dr Larose Kells started her on glucophage and it has mad her sick " he did not tell me about side effects" Seeing Dr Gardenia Phlegm Unable to afford Debra Knight / Wilder Glade Trying lower dose ER Metformin A1c 6.9 10/16/19  ? ?Husband Debra Knight also a patient of mine had repeat cervical surgery at [facility] with some improvement in UE weakness ? ?TTE 09/29/20 reviewed trivial  PVL normal EF mean gradient 7 mmHg  ?Myovue 09/29/20 normal no ischemia EF 80% ? ?Needs to see dentist  ? ? ?Past Medical History:  ?Diagnosis Date  ? Aortic stenosis, severe   ? a. 04/2017: s/p TAVR wtih a Medtronic Evolut-Pro  (size 29 mm, model # Evolutpro29US, serial # [id number])  ? CAD (coronary artery disease)   ? a. pre-TAVR cath 01/2017 showed 80% LCX stenosis. No angina. Plan to treat medically  ? Enlarged thyroid   ? GERD (gastroesophageal reflux disease)   ? Hyperlipidemia   ? OSA (obstructive sleep apnea)   ? no cpap   ? Osteoarthritis of both knees   ? Tinnitus   ? ? ?Past Surgical History:  ?Procedure Laterality Date  ? ANAL RECTAL MANOMETRY N/A 05/03/2021  ? Procedure: ANO RECTAL MANOMETRY;  Surgeon: Mauri Pole, MD;  Location: [facility];  Service: Endoscopy;  Laterality: N/A;  ? BIOPSY THYROID  2015  ? DILATION AND CURETTAGE OF UTERUS    ? LEFT HEART CATH AND CORONARY ANGIOGRAPHY N/A 01/15/2017  ? Procedure: Left Heart Cath and Coronary Angiography;  Surgeon: [name], MD;  Location: [facility];  Service: Cardiovascular;  Laterality: N/A;  ? TEE WITHOUT CARDIOVERSION N/A 04/23/2017  ? Procedure: TRANSESOPHAGEAL ECHOCARDIOGRAM (TEE);  Surgeon: [name], MD;  Location: [place];  Service: Open Heart Surgery;  Laterality: N/A;  ? TONSILLECTOMY  age 34  ? TOTAL KNEE ARTHROPLASTY Right 12/13/2014  ? Procedure: RIGHT TOTAL KNEE ARTHROPLASTY;  Surgeon: Gaynelle Arabian, MD;  Location: [facility];  Service: Orthopedics;  Laterality: Right;  ? TOTAL KNEE ARTHROPLASTY Left 02/20/2016  ? Procedure: LEFT TOTAL KNEE ARTHROPLASTY;  Surgeon: Gaynelle Arabian, MD;  Location: [facility];  Service: Orthopedics;  Laterality: Left;  ? TRANSCATHETER AORTIC VALVE REPLACEMENT, TRANSFEMORAL N/A 04/23/2017  ? Procedure: TRANSCATHETER AORTIC VALVE REPLACEMENT, TRANSFEMORAL;  Surgeon: [name], MD;  Location: [place];  Service: Open Heart Surgery;  Laterality: N/A;  MEDTRONIC VALVE  ? ? ?Current Medications: ?Outpatient Medications Prior to Visit  ?Medication Sig Dispense Refill  ? aspirin EC 81 MG tablet Take 81 mg by mouth every evening.    ?  atorvastatin (LIPITOR) 80 MG tablet TAKE 1 TABLET(80 MG) BY MOUTH AT BEDTIME. Keep appointment for future refills. 90 tablet 0  ? Blood Glucose Monitoring Suppl (ACCU-CHEK GUIDE) w/Device KIT 1 kit by Does not apply route See admin instructions. Use to check blood sugar 3 times a day 1 kit 0  ? clindamycin (CLEOCIN) 300 MG capsule Take 2 capsules (600 mg total) by mouth as directed. Take 2 tablets by mouth 60 minutes prior to dental appointments 2 capsule 3  ? dicyclomine (BENTYL) 20 MG tablet Take 20 mg by mouth 4 (four) times daily as needed.    ? glucose blood (ACCU-CHEK GUIDE) test strip  Use as instructed to check blood sugar once a day 100 each 11  ? metFORMIN (GLUCOPHAGE-XR) 500 MG 24 hr tablet Take 1 tablet (500 mg total) by mouth in the morning and at bedtime. 180 tablet 3  ? metoprolol tartrate (LOPRESSOR) 25 MG tablet Take 1 tablet (25 mg total) by mouth 2 (two) times daily. Keep appointment for future refills. 180 tablet 0  ? Multiple Vitamin (MULTIVITAMIN WITH MINERALS) TABS tablet Take 1 tablet by mouth daily.    ? ONE TOUCH LANCETS MISC Check blood sugar three times daily 300 each 12  ? pantoprazole (PROTONIX) 40 MG tablet Take 40 mg by mouth every morning.    ? dicyclomine (BENTYL) 10 MG capsule Take 1 capsule (10 mg total) by mouth 4 (four) times daily as needed for spasms. (Patient not taking: Reported on 12/08/2021) 40 capsule 0  ? nitroGLYCERIN (NITROSTAT) 0.4 MG SL tablet Place 1 tablet (0.4 mg total) under the tongue every 5 (five) minutes as needed for chest pain. 25 tablet 3  ? ?No facility-administered medications prior to visit.  ?  ? ?Allergies:   Advil [ibuprofen], Penicillins, Demerol [meperidine], Other, and Cortisone  ? ?Social History  ? ?Socioeconomic History  ? Marital status: Married  ?  Spouse name: Not on file  ? Number of children: 2  ? Years of education: Not on file  ? Highest education level: Not on file  ?Occupational History  ? Occupation: self employed  ?Tobacco Use  ? Smoking status: Former  ?  Packs/day: 2.00  ?  Years: 15.00  ?  Pack years: 30.00  ?  Types: Cigarettes  ?  Quit date: 09/03/1968  ?  Years since quitting: 53.2  ? Smokeless tobacco: Never  ? Tobacco comments:  ?  1-2 ppd. , NONE IN 40 YEARS  ?Vaping Use  ? Vaping Use: Never used  ?Substance and Sexual Activity  ? Alcohol use: No  ? Drug use: No  ? Sexual activity: Not on file  ?Other Topics Concern  ? Not on file  ?Social History Narrative  ? Household: pt and husband   ? ?Social Determinants of Health  ? ?Financial Resource Strain: Not on file  ?Food Insecurity: Not on file  ?Transportation Needs:  Not on file  ?Physical Activity: Not on file  ?Stress: Not on file  ?Social Connections: Not on file  ?  ? ?Family History:  The patient's family history includes Diabetes in her maternal grandfather and maternal uncle; Hashimoto's thyroiditis in her daughter and son; Heart attack (age of onset: 32) in her mother; Heart disease in her mother; Prostate cancer in her father.    ? ? ?ROS:   ?Please see the history of present illness.    ?ROS All other systems reviewed and are negative. ? ? ?PHYSICAL EXAM:   ?VS:  BP  122/60   Pulse 63   Ht 5' 1"  (1.549 m)   Wt 231 lb (104.8 kg)   SpO2 98%   BMI 43.65 kg/m?    ?Affect appropriate ?Obese white female  ?HEENT: normal ?Neck supple with no adenopathy ?JVP normal no bruits no thyromegaly ?Lungs clear with no wheezing and good diaphragmatic motion ?Heart:  S1/S2 SEM  murmur, no rub, gallop or click ?PMI normal ?Abdomen: benighn, BS positve, no tenderness, no AAA ?no bruit.  No HSM or HJR ?Distal pulses intact with no bruits post RFA cut down for TAVR  ?No edema ?Neuro non-focal ?Skin warm and dry ?Post bilateral TKR  ? ? ? ?Wt Readings from Last 3 Encounters:  ?12-31-2021 231 lb (104.8 kg)  ?09/29/20 223 lb (101.2 kg)  ?09/22/20 237 lb (107.5 kg)  ?  ? ? ?Studies/Labs Reviewed:  ? ?EKG:  12-31-21 SR rate 63 normal December 31, 2021 SR rate 73 normal  ? ?Recent Labs: ?No results found for requested labs within last 8760 hours.  ? ?Lipid Panel ?   ?Component Value Date/Time  ? CHOL 146 10/16/2019 1500  ? TRIG 153.0 (H) 10/16/2019 1500  ? HDL 41.00 10/16/2019 1500  ? CHOLHDL 4 10/16/2019 1500  ? VLDL 30.6 10/16/2019 1500  ? LDLCALC 75 10/16/2019 1500  ? LDLDIRECT 120.0 06/03/2018 1411  ? ? ?Additional studies/ records that were reviewed today include:  ?04/23/17 ?Procedure:      ?Transcatheter Aortic Valve Replacement - Percutaneous Right Transfemoral Approach ?            Medtronic Evolut-Pro  (size 29 mm, model # Evolutpro29US, serial # [id number]) ?  ?Co-Surgeons:            [name], MD and [name], MD ?  ?  ?Anesthesiologist:                  Rica Koyanagi, MD ?  ?Echocardiographer:              Ena Dawley, MD ?  ?Pre-operative Echo Findings: ?Severe aortic stenosis an

## 2021-12-08 ENCOUNTER — Encounter: Payer: Self-pay | Admitting: Cardiovascular Disease

## 2021-12-08 ENCOUNTER — Ambulatory Visit (INDEPENDENT_AMBULATORY_CARE_PROVIDER_SITE_OTHER): Payer: Medicare Other | Admitting: Cardiovascular Disease

## 2021-12-08 VITALS — BP 122/60 | HR 63 | Ht 61.0 in | Wt 231.0 lb

## 2021-12-08 DIAGNOSIS — I10 Essential (primary) hypertension: Secondary | ICD-10-CM | POA: Diagnosis not present

## 2021-12-08 DIAGNOSIS — Z952 Presence of prosthetic heart valve: Secondary | ICD-10-CM | POA: Diagnosis not present

## 2021-12-08 DIAGNOSIS — E782 Mixed hyperlipidemia: Secondary | ICD-10-CM

## 2021-12-08 DIAGNOSIS — I350 Nonrheumatic aortic (valve) stenosis: Secondary | ICD-10-CM | POA: Diagnosis not present

## 2021-12-08 DIAGNOSIS — I2510 Atherosclerotic heart disease of native coronary artery without angina pectoris: Secondary | ICD-10-CM | POA: Diagnosis not present

## 2021-12-08 DIAGNOSIS — I447 Left bundle-branch block, unspecified: Secondary | ICD-10-CM | POA: Diagnosis not present

## 2021-12-08 NOTE — Patient Instructions (Signed)
Medication Instructions:  °Your physician recommends that you continue on your current medications as directed. Please refer to the Current Medication list given to you today. ° °*If you need a refill on your cardiac medications before your next appointment, please call your pharmacy* ° °Lab Work: °If you have labs (blood work) drawn today and your tests are completely normal, you will receive your results only by: °MyChart Message (if you have MyChart) OR °A paper copy in the mail °If you have any lab test that is abnormal or we need to change your treatment, we will call you to review the results. ° °Testing/Procedures: °None ordered today. ° °Follow-Up: °At [facility], you and your health needs are our priority.  As part of our continuing mission to provide you with exceptional heart care, we have created designated Provider Care Teams.  These Care Teams include your primary Cardiologist (physician) and Advanced Practice Providers (APPs -  Physician Assistants and Nurse Practitioners) who all work together to provide you with the care you need, when you need it. ° °We recommend signing up for the patient portal called "MyChart".  Sign up information is provided on this After Visit Summary.  MyChart is used to connect with patients for Virtual Visits (Telemedicine).  Patients are able to view lab/test results, encounter notes, upcoming appointments, etc.  Non-urgent messages can be sent to your provider as well.   °To learn more about what you can do with MyChart, go to [email or web address].   ° °Your next appointment:   °6 month(s) ° °The format for your next appointment:   °In Person ° °Provider:   °Peter Nishan, MD { ° ° °

## 2021-12-15 ENCOUNTER — Telehealth: Payer: Self-pay

## 2021-12-15 NOTE — Telephone Encounter (Signed)
Patient called wanting to re-establish care with Dr. Larose Kells. Last time she saw him was on February of 2020. Her husband is a pt of [facility]. Would this be ok? ?

## 2021-12-15 NOTE — Telephone Encounter (Signed)
Decline

## 2021-12-18 NOTE — Telephone Encounter (Signed)
Spoke to patient and let her know that Dr. Larose Kells was at his max of patient's and unable to let her re-establish care. Offered to make appointment with any of our other 3 providers accepting new patient, to which she said she will think about it and call back.  ?

## 2021-12-21 DIAGNOSIS — H6121 Impacted cerumen, right ear: Secondary | ICD-10-CM | POA: Diagnosis not present

## 2021-12-21 DIAGNOSIS — H903 Sensorineural hearing loss, bilateral: Secondary | ICD-10-CM | POA: Diagnosis not present

## 2021-12-21 DIAGNOSIS — R2681 Unsteadiness on feet: Secondary | ICD-10-CM | POA: Diagnosis not present

## 2021-12-28 DIAGNOSIS — H5213 Myopia, bilateral: Secondary | ICD-10-CM | POA: Diagnosis not present

## 2021-12-28 DIAGNOSIS — H52223 Regular astigmatism, bilateral: Secondary | ICD-10-CM | POA: Diagnosis not present

## 2021-12-28 DIAGNOSIS — H25813 Combined forms of age-related cataract, bilateral: Secondary | ICD-10-CM | POA: Diagnosis not present

## 2021-12-28 DIAGNOSIS — E119 Type 2 diabetes mellitus without complications: Secondary | ICD-10-CM | POA: Diagnosis not present

## 2021-12-28 DIAGNOSIS — H04123 Dry eye syndrome of bilateral lacrimal glands: Secondary | ICD-10-CM | POA: Diagnosis not present

## 2021-12-28 DIAGNOSIS — D3131 Benign neoplasm of right choroid: Secondary | ICD-10-CM | POA: Diagnosis not present

## 2021-12-28 DIAGNOSIS — H524 Presbyopia: Secondary | ICD-10-CM | POA: Diagnosis not present

## 2022-01-04 ENCOUNTER — Ambulatory Visit: Payer: Medicare Other | Admitting: Cardiovascular Disease

## 2022-01-22 ENCOUNTER — Telehealth: Payer: Self-pay

## 2022-01-22 DIAGNOSIS — E1165 Type 2 diabetes mellitus with hyperglycemia: Secondary | ICD-10-CM

## 2022-01-22 MED ORDER — METFORMIN HCL ER 500 MG PO TB24: 500.0000 mg | ORAL_TABLET | Freq: Every day | ORAL | 0 refills | Status: DC

## 2022-01-22 NOTE — Telephone Encounter (Signed)
Let's send a 48-monthsupply without refills - I see she has an appointment in August.

## 2022-01-22 NOTE — Telephone Encounter (Signed)
Rx sent. Pt notified via [facility].

## 2022-01-22 NOTE — Telephone Encounter (Signed)
Pt called requested a refill for Metformin. PT has not been seen since 10/25/2019. Please advise.

## 2022-02-09 ENCOUNTER — Other Ambulatory Visit: Payer: Self-pay | Admitting: Cardiovascular Disease

## 2022-02-16 ENCOUNTER — Other Ambulatory Visit: Payer: Self-pay | Admitting: Cardiovascular Disease

## 2022-03-14 ENCOUNTER — Telehealth: Payer: Self-pay

## 2022-03-14 NOTE — Telephone Encounter (Signed)
-----   Message from Darreld Mclean, MD sent at 03/13/2022  3:35 PM EDT ----- Of course, we will get her in.  Thanks and hope you have a great day  JC  Debra Knight-can we please call this patient and get her established, thank you ----- Message ----- From: Volanda Napoleon, MD Sent: 03/13/2022   1:41 PM EDT To: Darreld Mclean, MD  Jess: I was wondering if you could do me a huge favor.  This is the wife of a patient of mine.  She needs to have a primary care doctor.  She really wants to keep all of the family's health care in our building.  We see her husband.  I think he is doing okay.  He may have amyloidosis.  If there is any way you could see her that would be fantastic.  I think she has been pretty healthy.  I hope that you are doing well.  I hope that you had a wonderful July 4 holiday.  Debra Knight

## 2022-03-14 NOTE — Telephone Encounter (Signed)
Please see below.

## 2022-03-21 ENCOUNTER — Other Ambulatory Visit: Payer: Self-pay | Admitting: Internal Medicine

## 2022-03-21 DIAGNOSIS — E1159 Type 2 diabetes mellitus with other circulatory complications: Secondary | ICD-10-CM

## 2022-03-30 NOTE — Progress Notes (Unsigned)
[place] at [facility] [address] [place],  [address] [phone]  Date:  04/02/2022   Name:  Debra Knight   DOB:  1946-07-23   MRN:  [id number]  PCP:  Patient, No Pcp Per    Chief Complaint: No chief complaint on file.   History of Present Illness:  Debra Knight is a 76 y.o. very pleasant female patient who presents with the following:  Seen today as a new patient on request of Dr Marin Olp  History of DM, CAD, HTN, hyperlipidemia, OSA, AS s/p TAVR Cardiologist is Dr Johnsie Cancel - seen in April  Debra Knight is a 76 y.o. female with a history of CAD, HLD, obesity, DJD and severe aortic stenosis s/p TAVR (04/23/17) with Medtronic 29 mm CoreValve Evolut Pro. This was done with general anesthesia and right TF approach. There was no PVL Cath prior to valve did show severe proximal circumflex disease 80% This has been Rx medically. She was thought to be a poor Open surgical candidate due to need for aortic root enlargement poor functional status . She has not had angina She had a normal Non ischemic myovue 08/01/17  Last TTE reviewed 07/09/19 no PVL mean gradient 8 mmHg stable   She was very upset about her DM. Dr Larose Kells started her on glucophage and it has mad her sick " he did not tell me about side effects" Seeing Dr Gardenia Phlegm Unable to afford Vania Rea / Wilder Glade Trying lower dose ER Metformin A1c 6.9 10/16/19   Seeing Dr Cruzita Lederer for her DM?  Lab Results  Component Value Date   HGBA1C 6.9 (A) 10/16/2019    Patient Active Problem List   Diagnosis Date Noted   Incontinence of feces    Lung nodule 08/26/2018   PCP NOTE >>>>>>>>>>>>>>>> 06/04/2018   Poorly controlled type 2 diabetes mellitus with circulatory disorder ([place]) 06/04/2018   Aortic stenosis, severe    Carotid artery disease (HCC)    CAD (coronary artery disease)    Arthritis of knee, degenerative 12/13/2014   Osteoarthritis of both knees    Abnormal thyroid exam     HTN (hypertension) 09/11/2013   Hyperlipidemia    OSA (obstructive sleep apnea) 04/09/2013    Past Medical History:  Diagnosis Date   Aortic stenosis, severe    a. 04/2017: s/p TAVR wtih a Medtronic Evolut-Pro  (size 29 mm, model # Evolutpro29US, serial # [id number])   CAD (coronary artery disease)    a. pre-TAVR cath 01/2017 showed 80% LCX stenosis. No angina. Plan to treat medically   Enlarged thyroid    GERD (gastroesophageal reflux disease)    Hyperlipidemia    OSA (obstructive sleep apnea)    no cpap    Osteoarthritis of both knees    Tinnitus     Past Surgical History:  Procedure Laterality Date   ANAL RECTAL MANOMETRY N/A 05/03/2021   Procedure: ANO RECTAL MANOMETRY;  Surgeon: Mauri Pole, MD;  [facility]: [facility];  Service: Endoscopy;  Laterality: N/A;   BIOPSY THYROID  2015   DILATION AND CURETTAGE OF UTERUS     LEFT HEART CATH AND CORONARY ANGIOGRAPHY N/A 01/15/2017   Procedure: Left Heart Cath and Coronary Angiography;  Surgeon: [name], MD;  Location: [facility];  Service: Cardiovascular;  Laterality: N/A;   TEE WITHOUT CARDIOVERSION N/A 04/23/2017   Procedure: TRANSESOPHAGEAL ECHOCARDIOGRAM (TEE);  Surgeon: [name], MD;  Location: [place];  Service: Open  Heart Surgery;  Laterality: N/A;   TONSILLECTOMY  age 46   TOTAL KNEE ARTHROPLASTY Right 12/13/2014   Procedure: RIGHT TOTAL KNEE ARTHROPLASTY;  Surgeon: Gaynelle Arabian, MD;  [facility];  Service: Orthopedics;  Laterality: Right;   TOTAL KNEE ARTHROPLASTY Left 02/20/2016   Procedure: LEFT TOTAL KNEE ARTHROPLASTY;  Surgeon: Gaynelle Arabian, MD;  [facility];  Service: Orthopedics;  Laterality: Left;   TRANSCATHETER AORTIC VALVE REPLACEMENT, TRANSFEMORAL N/A 04/23/2017   Procedure: TRANSCATHETER AORTIC VALVE REPLACEMENT, TRANSFEMORAL;  Surgeon: [name], MD;  Location: [place];  Service: Open Heart Surgery;  Laterality: N/A;  MEDTRONIC VALVE    Social History   Tobacco Use    Smoking status: Former    Packs/day: 2.00    Years: 15.00    Total pack years: 30.00    Types: Cigarettes    Quit date: 09/03/1968    Years since quitting: 53.6   Smokeless tobacco: Never   Tobacco comments:    1-2 ppd. , NONE IN 37 YEARS  Vaping Use   Vaping Use: Never used  Substance Use Topics   Alcohol use: No   Drug use: No    Family History  Problem Relation Age of Onset   Heart disease Mother    Heart attack Mother 68   Prostate cancer Father    Diabetes Maternal Grandfather    Diabetes Maternal Uncle    Hashimoto's thyroiditis Daughter    Hashimoto's thyroiditis Son    Breast cancer Neg Hx     Allergies  Allergen Reactions   Advil [Ibuprofen] Anaphylaxis, Swelling and Other (See Comments)    Throat swelling per patient   Penicillins Hives, Swelling and Other (See Comments)    Tongue swelling Has patient had a PCN reaction causing immediate rash, facial/tongue/throat swelling, SOB or lightheadedness with hypotension: yes Has patient had a PCN reaction causing severe rash involving mucus membranes or skin necrosis: no Has patient had a PCN reaction that required hospitalization no Has patient had a PCN reaction occurring within the last 10 years: no If all of the above answers are "NO", then may proceed with Cepha   Demerol [Meperidine] Nausea And Vomiting   Other Hives and Other (See Comments)    Duck   Cortisone Anxiety and Other (See Comments)    Loopy     Medication list has been reviewed and updated.  Current Outpatient Medications on File Prior to Visit  Medication Sig Dispense Refill   aspirin EC 81 MG tablet Take 81 mg by mouth every evening.     atorvastatin (LIPITOR) 80 MG tablet TAKE 1 TABLET(80 MG) BY MOUTH AT BEDTIME. 90 tablet 3   Blood Glucose Monitoring Suppl (ACCU-CHEK GUIDE) w/Device KIT 1 kit by Does not apply route See admin instructions. Use to check blood sugar 3 times a day 1 kit 0   clindamycin (CLEOCIN) 300 MG capsule Take 2 capsules  (600 mg total) by mouth as directed. Take 2 tablets by mouth 60 minutes prior to dental appointments 2 capsule 3   dicyclomine (BENTYL) 10 MG capsule Take 1 capsule (10 mg total) by mouth 4 (four) times daily as needed for spasms. (Patient not taking: Reported on 12/08/2021) 40 capsule 0   dicyclomine (BENTYL) 20 MG tablet Take 20 mg by mouth 4 (four) times daily as needed.     glucose blood (ACCU-CHEK GUIDE) test strip Use as instructed to check blood sugar once a day 100 each 11   metFORMIN (GLUCOPHAGE-XR) 500 MG 24 hr  tablet TAKE 1 TABLET (500 MG TOTAL) BY MOUTH DAILY. 30 tablet 0   metoprolol tartrate (LOPRESSOR) 25 MG tablet TAKE 1 TABLET (25 MG TOTAL) BY MOUTH 2 (TWO) TIMES DAILY. KEEP APPOINTMENT FOR FUTURE REFILLS. 180 tablet 3   Multiple Vitamin (MULTIVITAMIN WITH MINERALS) TABS tablet Take 1 tablet by mouth daily.     nitroGLYCERIN (NITROSTAT) 0.4 MG SL tablet Place 1 tablet (0.4 mg total) under the tongue every 5 (five) minutes as needed for chest pain. 25 tablet 3   ONE TOUCH LANCETS MISC Check blood sugar three times daily 300 each 12   pantoprazole (PROTONIX) 40 MG tablet Take 40 mg by mouth every morning.     No current facility-administered medications on file prior to visit.    Review of Systems:  As per HPI- otherwise negative.   Physical Examination: There were no vitals filed for this visit. There were no vitals filed for this visit. There is no height or weight on file to calculate BMI. Ideal Body Weight:    GEN: no acute distress. HEENT: Atraumatic, Normocephalic.  Ears and Nose: No external deformity. CV: RRR, No M/G/R. No JVD. No thrill. No extra heart sounds. PULM: CTA B, no wheezes, crackles, rhonchi. No retractions. No resp. distress. No accessory muscle use. ABD: S, NT, ND, +BS. No rebound. No HSM. EXTR: No c/c/e PSYCH: Normally interactive. Conversant.    Assessment and Plan: ***  Signed Lamar Blinks, MD

## 2022-04-02 ENCOUNTER — Ambulatory Visit (INDEPENDENT_AMBULATORY_CARE_PROVIDER_SITE_OTHER): Payer: Medicare Other | Admitting: Family Medicine

## 2022-04-02 VITALS — BP 134/68 | HR 59 | Temp 97.6°F | Resp 18 | Ht 61.0 in | Wt 234.2 lb

## 2022-04-02 DIAGNOSIS — I2510 Atherosclerotic heart disease of native coronary artery without angina pectoris: Secondary | ICD-10-CM | POA: Diagnosis not present

## 2022-04-02 DIAGNOSIS — E118 Type 2 diabetes mellitus with unspecified complications: Secondary | ICD-10-CM

## 2022-04-02 DIAGNOSIS — I10 Essential (primary) hypertension: Secondary | ICD-10-CM | POA: Diagnosis not present

## 2022-04-02 DIAGNOSIS — L659 Nonscarring hair loss, unspecified: Secondary | ICD-10-CM

## 2022-04-02 DIAGNOSIS — E7849 Other hyperlipidemia: Secondary | ICD-10-CM

## 2022-04-02 NOTE — Patient Instructions (Addendum)
It was good to see you today- I will be in touch with your labs asap!  Please see me in about 6 months assuming all is well

## 2022-04-03 ENCOUNTER — Encounter: Payer: Self-pay | Admitting: Internal Medicine

## 2022-04-03 ENCOUNTER — Encounter: Payer: Self-pay | Admitting: Family Medicine

## 2022-04-03 ENCOUNTER — Ambulatory Visit (INDEPENDENT_AMBULATORY_CARE_PROVIDER_SITE_OTHER): Payer: Medicare Other | Admitting: Internal Medicine

## 2022-04-03 VITALS — BP 118/64 | HR 62 | Ht 61.0 in | Wt 233.0 lb

## 2022-04-03 DIAGNOSIS — E7849 Other hyperlipidemia: Secondary | ICD-10-CM | POA: Diagnosis not present

## 2022-04-03 DIAGNOSIS — I2510 Atherosclerotic heart disease of native coronary artery without angina pectoris: Secondary | ICD-10-CM

## 2022-04-03 DIAGNOSIS — E1159 Type 2 diabetes mellitus with other circulatory complications: Secondary | ICD-10-CM | POA: Diagnosis not present

## 2022-04-03 DIAGNOSIS — E1165 Type 2 diabetes mellitus with hyperglycemia: Secondary | ICD-10-CM | POA: Diagnosis not present

## 2022-04-03 LAB — COMPREHENSIVE METABOLIC PANEL
ALT: 26 U/L (ref 0–35)
AST: 20 U/L (ref 0–37)
Albumin: 3.9 g/dL (ref 3.5–5.2)
Alkaline Phosphatase: 122 U/L — ABNORMAL HIGH (ref 39–117)
BUN: 13 mg/dL (ref 6–23)
CO2: 29 mEq/L (ref 19–32)
Calcium: 8.9 mg/dL (ref 8.4–10.5)
Chloride: 103 mEq/L (ref 96–112)
Creatinine, Ser: 0.74 mg/dL (ref 0.40–1.20)
GFR: 78.2 mL/min (ref >60.00)
Glucose, Bld: 103 mg/dL — ABNORMAL HIGH (ref 70–99)
Potassium: 4.5 mEq/L (ref 3.5–5.1)
Sodium: 139 mEq/L (ref 135–145)
Total Bilirubin: 0.5 mg/dL (ref 0.2–1.2)
Total Protein: 6.3 g/dL (ref 6.0–8.3)

## 2022-04-03 LAB — CBC
HCT: 38.9 % (ref 36.0–46.0)
Hemoglobin: 13 g/dL (ref 12.0–15.0)
MCHC: 33.3 g/dL (ref 30.0–36.0)
MCV: 89.3 fl (ref 78.0–100.0)
Platelets: 199 10*3/uL (ref 150.0–400.0)
RBC: 4.35 Mil/uL (ref 3.87–5.11)
RDW: 14.8 % (ref 11.5–15.5)
WBC: 6.2 10*3/uL (ref 4.0–10.5)

## 2022-04-03 LAB — LIPID PANEL
Cholesterol: 186 mg/dL (ref 0–200)
HDL: 35.1 mg/dL — ABNORMAL LOW (ref >39.00)
Total CHOL/HDL Ratio: 5
Triglycerides: 495 mg/dL — ABNORMAL HIGH (ref 0.0–149.0)

## 2022-04-03 LAB — LDL CHOLESTEROL, DIRECT: Direct LDL: 89 mg/dL

## 2022-04-03 LAB — HEMOGLOBIN A1C: Hgb A1c MFr Bld: 6.6 % — ABNORMAL HIGH (ref 4.6–6.5)

## 2022-04-03 LAB — FERRITIN: Ferritin: 68.6 ng/mL (ref 10.0–291.0)

## 2022-04-03 LAB — TSH: TSH: 2.72 u[IU]/mL (ref 0.35–5.50)

## 2022-04-03 MED ORDER — ACCU-CHEK GUIDE VI STRP: ORAL_STRIP | 3 refills | Status: DC

## 2022-04-03 NOTE — Patient Instructions (Addendum)
Please stop Metformin.  Let me know if the sugars worsen after this.  Check some sugars later in the day, also.  Please return in 6 months with your sugar log.

## 2022-04-03 NOTE — Progress Notes (Signed)
Patient ID: Debra Knight, female   DOB: [birth year], 76 y.o.   MRN: [id number]  HPI: Debra Knight is a 76 y.o.-year-old female, initially referred by her cardiologist, Dr. Johnsie Cancel, returning for follow-up for DM2, dx in 2018, non-insulin-dependent, uncontrolled, with complications (CAD, carotid artery disease, severe aortic stenosis).  Last visit 1.5 years ago.  She is here with her husband.  Interim history: She has chronic increased urination. No blurry vision.  She continues to have abdominal pain and diarrhea and is on 3 medications for this.  She mentions that the GI symptoms are caused by metformin.  She has hair loss.  TSH was checked by PCP yesterday (results pending).  Son and daughter have hypothyroidism.  Reviewed latest HbA1c level: Lab Results  Component Value Date   HGBA1C 6.6 (H) 04/02/2022   HGBA1C 6.9 (A) 10/16/2019   HGBA1C 7.3 (A) 07/15/2019   HGBA1C 6.9 (H) 10/30/2018   HGBA1C 7.9 (H) 06/03/2018   HGBA1C 7.0 (H) 04/18/2017   In the past, we tried to start several medications: - Farxiga 5 mg daily - not covered - Jardiance 10 mg daily - stopped being covered - stopped 09/2019 - restarted Metformin 850 mg 2x a day >> explosive diarrhea, slightly improving now; retried this later with the same effects.  She is currently on: - Metformin ER 500 mg with dinner - on Dicyclomine, Protonix, Glycopyrrolate.  She previously tried a [employer].  She lost weight and sugars improved but she was off the diet at last visit.  Pt checks her sugars once a day: - brunch: 123, 151-178, 195 >> 115-131 >> 109-130, 139, 148 - 2h after b'fast: n/c >> 117, 126, 145 - before dinner: 112-158 >> n/c  - 2h after dinner: n/c - bedtime: 151, 185 >> 200 >> n/c - nighttime: n/c Lowest sugar was 112 >> 115 >> 97; it is unclear at which level she has hypoglycemia awareness Highest sugar was 175 >> 200 >> 176.  Glucometer: One Touch Verio >>Accuchek guide  Pt's meals are: -  Brunch: toast or roll or bowl of cereal - Dinner: chicken, veggies, mashed potatoes - Snacks: graham crackers  No CKD, last BUN/creatinine:  Lab Results  Component Value Date   BUN 12 10/16/2019   BUN 24 (H) 08/25/2018   CREATININE 0.68 10/16/2019   CREATININE 0.76 08/25/2018  She is not on ACE inhibitor/ARB.  + HL last set of lipids: Lab Results  Component Value Date   CHOL 146 10/16/2019   HDL 41.00 10/16/2019   LDLCALC 75 10/16/2019   LDLDIRECT 120.0 06/03/2018   TRIG 153.0 (H) 10/16/2019   CHOLHDL 4 10/16/2019  On Lipitor 80.  - last eye exam was on 2023: No DR reportedly. Sees retina specialist for a retinal freckle - Dr. Armanda Heritage.  - No numbness or tingling.  On ASA 81.  Pt has FH of DM in MGF, M uncle.  She had a TAVR procedure.   She had normal TSH levels: Lab Results  Component Value Date   TSH 1.91 06/03/2018   TSH 2.17 10/05/2013    ROS: + see HPI  I reviewed pt's medications, allergies, PMH, social hx, family hx, and changes were documented in the history of present illness. Otherwise, unchanged from my initial visit note.  Past Medical History:  Diagnosis Date   Aortic stenosis, severe    a. 04/2017: s/p TAVR wtih a Medtronic Evolut-Pro  (size 29 mm, model # Evolutpro29US, serial # [id number])  CAD (coronary artery disease)    a. pre-TAVR cath 01/2017 showed 80% LCX stenosis. No angina. Plan to treat medically   Enlarged thyroid    GERD (gastroesophageal reflux disease)    Hyperlipidemia    OSA (obstructive sleep apnea)    no cpap    Osteoarthritis of both knees    Tinnitus    Past Surgical History:  Procedure Laterality Date   ANAL RECTAL MANOMETRY N/A 05/03/2021   Procedure: ANO RECTAL MANOMETRY;  Surgeon: Mauri Pole, MD;  Location: [facility];  Service: Endoscopy;  Laterality: N/A;   BIOPSY THYROID  2015   DILATION AND CURETTAGE OF UTERUS     LEFT HEART CATH AND CORONARY ANGIOGRAPHY N/A 01/15/2017   Procedure: Left Heart Cath  and Coronary Angiography;  Surgeon: [name], MD;  Location: [facility];  Service: Cardiovascular;  Laterality: N/A;   TEE WITHOUT CARDIOVERSION N/A 04/23/2017   Procedure: TRANSESOPHAGEAL ECHOCARDIOGRAM (TEE);  Surgeon: [name], MD;  Location: [place];  Service: Open Heart Surgery;  Laterality: N/A;   TONSILLECTOMY  age 35   TOTAL KNEE ARTHROPLASTY Right 12/13/2014   Procedure: RIGHT TOTAL KNEE ARTHROPLASTY;  Surgeon: Gaynelle Arabian, MD;  Location: [facility];  Service: Orthopedics;  Laterality: Right;   TOTAL KNEE ARTHROPLASTY Left 02/20/2016   Procedure: LEFT TOTAL KNEE ARTHROPLASTY;  Surgeon: Gaynelle Arabian, MD;  Location: [facility];  Service: Orthopedics;  Laterality: Left;   TRANSCATHETER AORTIC VALVE REPLACEMENT, TRANSFEMORAL N/A 04/23/2017   Procedure: TRANSCATHETER AORTIC VALVE REPLACEMENT, TRANSFEMORAL;  Surgeon: [name], MD;  Location: [place];  Service: Open Heart Surgery;  Laterality: N/A;  [employer] VALVE   Social History   Socioeconomic History   Marital status: Married    Spouse name: Not on file   Number of children: 2   Years of education: Not on file   Highest education level: Not on file  Occupational History   Occupation: self employed  Tobacco Use   Smoking status: Former    Packs/day: 2.00    Years: 15.00    Total pack years: 30.00    Types: Cigarettes    Quit date: 09/03/1968    Years since quitting: 53.6   Smokeless tobacco: Never   Tobacco comments:    1-2 ppd. , NONE IN 40 YEARS  Vaping Use   Vaping Use: Never used  Substance and Sexual Activity   Alcohol use: No   Drug use: No   Sexual activity: Not on file  Other Topics Concern   Not on file  Social History Narrative   Household: pt and husband    Social Determinants of Radio broadcast assistant Strain: Not on file  Food Insecurity: Not on file  Transportation Needs: Not on file  Physical Activity: Not on file  Stress: Not on file  Social Connections: Not on file  Intimate  Partner Violence: Not on file   Current Outpatient Medications on File Prior to Visit  Medication Sig Dispense Refill   aspirin EC 81 MG tablet Take 81 mg by mouth every evening.     atorvastatin (LIPITOR) 80 MG tablet TAKE 1 TABLET(80 MG) BY MOUTH AT BEDTIME. 90 tablet 3   Blood Glucose Monitoring Suppl (ACCU-CHEK GUIDE) w/Device KIT 1 kit by Does not apply route See admin instructions. Use to check blood sugar 3 times a day 1 kit 0   clindamycin (CLEOCIN) 300 MG capsule Take 2 capsules (600 mg total) by mouth as directed. Take 2 tablets by mouth 60  minutes prior to dental appointments 2 capsule 3   dicyclomine (BENTYL) 20 MG tablet Take 20 mg by mouth 4 (four) times daily as needed.     glucose blood (ACCU-CHEK GUIDE) test strip Use as instructed to check blood sugar once a day 100 each 11   glycopyrrolate (ROBINUL) 2 MG tablet Take 2 mg by mouth 2 (two) times daily.     metFORMIN (GLUCOPHAGE-XR) 500 MG 24 hr tablet TAKE 1 TABLET (500 MG TOTAL) BY MOUTH DAILY. 30 tablet 0   metoprolol tartrate (LOPRESSOR) 25 MG tablet TAKE 1 TABLET (25 MG TOTAL) BY MOUTH 2 (TWO) TIMES DAILY. KEEP APPOINTMENT FOR FUTURE REFILLS. 180 tablet 3   Multiple Vitamin (MULTIVITAMIN WITH MINERALS) TABS tablet Take 1 tablet by mouth daily.     nitroGLYCERIN (NITROSTAT) 0.4 MG SL tablet Place 1 tablet (0.4 mg total) under the tongue every 5 (five) minutes as needed for chest pain. 25 tablet 3   ONE TOUCH LANCETS MISC Check blood sugar three times daily 300 each 12   pantoprazole (PROTONIX) 40 MG tablet Take 40 mg by mouth every morning.     No current facility-administered medications on file prior to visit.   Allergies  Allergen Reactions   Advil [Ibuprofen] Anaphylaxis, Swelling and Other (See Comments)    Throat swelling per patient   Penicillins Hives, Swelling and Other (See Comments)    Tongue swelling Has patient had a PCN reaction causing immediate rash, facial/tongue/throat swelling, SOB or lightheadedness  with hypotension: yes Has patient had a PCN reaction causing severe rash involving mucus membranes or skin necrosis: no Has patient had a PCN reaction that required hospitalization no Has patient had a PCN reaction occurring within the last 10 years: no If all of the above answers are "NO", then may proceed with Cepha   Demerol [Meperidine] Nausea And Vomiting   Other Hives and Other (See Comments)    Duck   Cortisone Anxiety and Other (See Comments)    Loopy    Family History  Problem Relation Age of Onset   Heart disease Mother    Heart attack Mother 39   Prostate cancer Father    Diabetes Maternal Grandfather    Diabetes Maternal Uncle    Hashimoto's thyroiditis Daughter    Hashimoto's thyroiditis Son    Breast cancer Neg Hx    PE: BP 118/64 (BP Location: Left Arm, Patient Position: Sitting, Cuff Size: Normal)   Pulse 62   Ht 5' 1"  (1.549 m)   Wt 233 lb (105.7 kg)   SpO2 98%   BMI 44.02 kg/m  Wt Readings from Last 3 Encounters:  04/03/22 233 lb (105.7 kg)  04/02/22 234 lb 3.2 oz (106.2 kg)  12/08/21 231 lb (104.8 kg)   Constitutional: overweight, in NAD Eyes: EOMI, no exophthalmos ENT: moist mucous membranes, no thyromegaly, no cervical lymphadenopathy Cardiovascular: RRR, No RG, +1/6 SEM, + R>L pitting LE edema Respiratory: CTA B Musculoskeletal: no deformities Skin: moist, warm, no rashes Neurological: no tremor with outstretched hands Diabetic Foot Exam - Simple   Simple Foot Form Diabetic Foot exam was performed with the following findings: Yes 04/03/2022 11:55 AM  Visual Inspection See comments: Yes Sensation Testing Intact to touch and monofilament testing bilaterally: Yes Pulse Check Posterior Tibialis and Dorsalis pulse intact bilaterally: Yes Comments B LE edema - pitting R>L    ASSESSMENT: 1. DM2, non-insulin-dependent, uncontrolled, with complications - CAD - carotid a. Ds. - severe AoSt  2. HL  3. Obesity class  III  PLAN:  1. Patient  with longstanding, uncontrolled, longstanding, type 2 diabetes, diet controlled.  We tried metformin for her but she had GI intolerance with explosive diarrhea repeatedly when she tried it.  At last visit I advised her to switch from regular metformin to metformin ER.  She is not taking 1 tablet of the extended release formulation, but she still has significant GI discomfort with it and has to take 3 medications without complete resolution of her symptoms.  We could not use SGLT2 inhibitors in the past due to price.  She now returns after long absence of 1.5 years.  HbA1c obtained yesterday was excellent, at 6.6%, lower than before. -At today's visit, sugars at home remained mostly at goal, with occasional higher numbers in the morning depending on her dinner the night before.  She is not checking sugars later in the day and I advised her to start, rotating check times.  I sent new prescription for test trips to her pharmacy. -Due to the significant GI discomfort with metformin, we cannot use this anymore.  I advised him to let me know if sugars are above target in the next 2 weeks, in which case, we can try a sulfonylurea or meglitinide.  I will try to avoid a TZD for her due to leg swelling and weight gain. - I suggested to:  Patient Instructions  Please stop Metformin.  Let me know if the sugars worsen after this.  Check some sugars later in the day, also.  Please return in 6 months with your sugar log.   - advised to check sugars at different times of the day - 1x a day, rotating check times - advised for yearly eye exams >> she is UTD - return to clinic in 6 months  2. HL -Reviewed latest lipid panel from 10/2019: LDL above our target of less than 55 due to history of cardiovascular disease, the rest the fractions at goal: Lab Results  Component Value Date   CHOL 146 10/16/2019   HDL 41.00 10/16/2019   LDLCALC 75 10/16/2019   LDLDIRECT 120.0 06/03/2018   TRIG 153.0 (H) 10/16/2019    CHOLHDL 4 10/16/2019  -She continues on Lipitor 80 mg daily without side effects -She had a lipid panel drawn yesterday by PCP-results pending  3.  Obesity class III -Her insurance did not cover an SGLT2 inhibitor and we held off trying a GLP-1 receptor agonist due to significant GI problems in the past. -She is on a very low-dose metformin ER, which could help with decreasing appetite, however, she cannot tolerate this, so we have to stop it. -She gained 10 pounds since our last visit  Philemon Kingdom, MD PhD [facility]

## 2022-04-22 ENCOUNTER — Other Ambulatory Visit: Payer: Self-pay | Admitting: Internal Medicine

## 2022-04-22 DIAGNOSIS — E1165 Type 2 diabetes mellitus with hyperglycemia: Secondary | ICD-10-CM

## 2022-04-22 DIAGNOSIS — E1159 Type 2 diabetes mellitus with other circulatory complications: Secondary | ICD-10-CM

## 2022-04-25 DIAGNOSIS — H35363 Drusen (degenerative) of macula, bilateral: Secondary | ICD-10-CM | POA: Diagnosis not present

## 2022-04-25 DIAGNOSIS — H25813 Combined forms of age-related cataract, bilateral: Secondary | ICD-10-CM | POA: Diagnosis not present

## 2022-04-25 DIAGNOSIS — D3131 Benign neoplasm of right choroid: Secondary | ICD-10-CM | POA: Diagnosis not present

## 2022-05-18 ENCOUNTER — Other Ambulatory Visit: Payer: Self-pay | Admitting: Internal Medicine

## 2022-05-18 DIAGNOSIS — E1165 Type 2 diabetes mellitus with hyperglycemia: Secondary | ICD-10-CM

## 2022-06-01 ENCOUNTER — Other Ambulatory Visit: Payer: Self-pay | Admitting: Internal Medicine

## 2022-06-01 DIAGNOSIS — E1165 Type 2 diabetes mellitus with hyperglycemia: Secondary | ICD-10-CM

## 2022-06-22 ENCOUNTER — Ambulatory Visit (INDEPENDENT_AMBULATORY_CARE_PROVIDER_SITE_OTHER): Payer: Medicare Other

## 2022-06-22 DIAGNOSIS — Z23 Encounter for immunization: Secondary | ICD-10-CM

## 2022-06-25 NOTE — Progress Notes (Signed)
Pt came in for the flu shot HD.

## 2022-07-05 ENCOUNTER — Encounter: Payer: Self-pay | Admitting: Family Medicine

## 2022-08-31 DIAGNOSIS — Z23 Encounter for immunization: Secondary | ICD-10-CM | POA: Diagnosis not present

## 2022-10-09 ENCOUNTER — Ambulatory Visit (INDEPENDENT_AMBULATORY_CARE_PROVIDER_SITE_OTHER): Payer: Medicare Other | Admitting: Internal Medicine

## 2022-10-09 ENCOUNTER — Encounter: Payer: Self-pay | Admitting: Internal Medicine

## 2022-10-09 VITALS — BP 132/82 | HR 63 | Ht 61.0 in | Wt 246.0 lb

## 2022-10-09 DIAGNOSIS — E7849 Other hyperlipidemia: Secondary | ICD-10-CM | POA: Diagnosis not present

## 2022-10-09 DIAGNOSIS — E66813 Obesity, class 3: Secondary | ICD-10-CM

## 2022-10-09 DIAGNOSIS — E1159 Type 2 diabetes mellitus with other circulatory complications: Secondary | ICD-10-CM

## 2022-10-09 DIAGNOSIS — E1165 Type 2 diabetes mellitus with hyperglycemia: Secondary | ICD-10-CM | POA: Diagnosis not present

## 2022-10-09 LAB — POCT GLYCOSYLATED HEMOGLOBIN (HGB A1C): Hemoglobin A1C: 6.9 % — AB (ref 4.0–5.6)

## 2022-10-09 NOTE — Progress Notes (Signed)
Patient ID: Debra Knight, female   DOB: [birth year], 77 y.o.   MRN: [id number]  HPI: Debra Knight is a 77 y.o.-year-old female, initially referred by her cardiologist, Dr. Johnsie Cancel, returning for follow-up for DM2, dx in 2018, non-insulin-dependent, uncontrolled, with complications (CAD, carotid artery disease, severe aortic stenosis).  Last visit 6 months ago.  She is here with her husband.  Interim history: She has chronic increased urination. No blurry vision.   She had abdominal pain and diarrhea at last visit despite coming off metformin - was on 3 medications, now off. She has GERD, still some diarrhea. She has hair loss. She relaxed her diet since last visit.  She mentions that she is eating more carbs.  She gained some weight and sugars are higher.  Reviewed latest HbA1c level: Lab Results  Component Value Date   HGBA1C 6.6 (H) 04/02/2022   HGBA1C 6.9 (A) 10/16/2019   HGBA1C 7.3 (A) 07/15/2019   HGBA1C 6.9 (H) 10/30/2018   HGBA1C 7.9 (H) 06/03/2018   HGBA1C 7.0 (H) 04/18/2017   In the past, we tried to start several medications: - Farxiga 5 mg daily - not covered - Jardiance 10 mg daily - stopped being covered - stopped 09/2019 - restarted Metformin 850 mg 2x a day >> explosive diarrhea, slightly improving now; retried this later with the same effects.  We also had to stop: - Metformin ER 500 mg with dinner - 2/2 severe diarrhea  She previously tried a weight watchers diet.  She lost weight and sugars improved but she was off the diet at last visit.  Pt checks her sugars 0-1x a day: - brunch: 123, 151-178, 195 >> 115-131 >> 109-130, 139, 148 >> 140-143 - 2h after b'fast: n/c >> 117, 126, 145 >> n/c - before dinner: 112-158 >> n/c  - 2h after dinner: n/c - bedtime: 151, 185 >> 200 >> n/c >> 157 - nighttime: n/c Lowest sugar was 112 >> 115 >> 97 >> 137; it is unclear at which level she has hypoglycemia awareness Highest sugar was 175 >> 200 >> 176 >> 157.  Glucometer:  One Touch Verio >>Accuchek guide  Pt's meals are: - Brunch: toast or roll or bowl of cereal - Dinner: chicken, veggies, mashed potatoes - Snacks: graham crackers  No CKD, last BUN/creatinine:  Lab Results  Component Value Date   BUN 13 04/02/2022   BUN 12 10/16/2019   CREATININE 0.74 04/02/2022   CREATININE 0.68 10/16/2019  She is not on ACE inhibitor/ARB.  + HL last set of lipids: Lab Results  Component Value Date   CHOL 186 04/02/2022   HDL 35.10 (L) 04/02/2022   LDLCALC 75 10/16/2019   LDLDIRECT 89.0 04/02/2022   TRIG (H) 04/02/2022    495.0 Triglyceride is over 400; calculations on Lipids are invalid.   CHOLHDL 5 04/02/2022  On Lipitor 80.  - last eye exam was on 2023: No DR reportedly. Sees retina specialist for a retinal freckle - Dr. Armanda Heritage.  - No numbness or tingling.  Last foot exam 04/03/2022.  On ASA 81.  Pt has FH of DM in MGF, M uncle.  She had a TAVR procedure.   She had normal TSH levels: Lab Results  Component Value Date   TSH 2.72 04/02/2022   TSH 1.91 06/03/2018   TSH 2.17 10/05/2013   ROS: + see HPI  I reviewed pt's medications, allergies, PMH, social hx, family hx, and changes were documented in the history of present illness. Otherwise,  unchanged from my initial visit note.  Past Medical History:  Diagnosis Date   Aortic stenosis, severe    a. 04/2017: s/p TAVR wtih a Medtronic Evolut-Pro  (size 29 mm, model # Evolutpro29US, serial # [id number])   CAD (coronary artery disease)    a. pre-TAVR cath 01/2017 showed 80% LCX stenosis. No angina. Plan to treat medically   Enlarged thyroid    GERD (gastroesophageal reflux disease)    Hyperlipidemia    OSA (obstructive sleep apnea)    no cpap    Osteoarthritis of both knees    Tinnitus    Past Surgical History:  Procedure Laterality Date   ANAL RECTAL MANOMETRY N/A 05/03/2021   Procedure: ANO RECTAL MANOMETRY;  Surgeon: Mauri Pole, MD;  Location: [facility];  Service: Endoscopy;   Laterality: N/A;   BIOPSY THYROID  2015   DILATION AND CURETTAGE OF UTERUS     LEFT HEART CATH AND CORONARY ANGIOGRAPHY N/A 01/15/2017   Procedure: Left Heart Cath and Coronary Angiography;  Surgeon: [name], MD;  Location: [facility];  Service: Cardiovascular;  Laterality: N/A;   TEE WITHOUT CARDIOVERSION N/A 04/23/2017   Procedure: TRANSESOPHAGEAL ECHOCARDIOGRAM (TEE);  Surgeon: [name], MD;  Location: [facility];  Service: Open Heart Surgery;  Laterality: N/A;   TONSILLECTOMY  age 67   TOTAL KNEE ARTHROPLASTY Right 12/13/2014   Procedure: RIGHT TOTAL KNEE ARTHROPLASTY;  Surgeon: Gaynelle Arabian, MD;  Location: [facility];  Service: Orthopedics;  Laterality: Right;   TOTAL KNEE ARTHROPLASTY Left 02/20/2016   Procedure: LEFT TOTAL KNEE ARTHROPLASTY;  Surgeon: Gaynelle Arabian, MD;  Location: [facility];  Service: Orthopedics;  Laterality: Left;   TRANSCATHETER AORTIC VALVE REPLACEMENT, TRANSFEMORAL N/A 04/23/2017   Procedure: TRANSCATHETER AORTIC VALVE REPLACEMENT, TRANSFEMORAL;  Surgeon: [name], MD;  Location: [facility];  Service: Open Heart Surgery;  Laterality: N/A;  MEDTRONIC VALVE   Social History   Socioeconomic History   Marital status: Married    Spouse name: Not on file   Number of children: 2   Years of education: Not on file   Highest education level: Not on file  Occupational History   Occupation: self employed  Tobacco Use   Smoking status: Former    Packs/day: 2.00    Years: 15.00    Total pack years: 30.00    Types: Cigarettes    Quit date: 09/03/1968    Years since quitting: 54.1   Smokeless tobacco: Never   Tobacco comments:    1-2 ppd. , NONE IN 40 YEARS  Vaping Use   Vaping Use: Never used  Substance and Sexual Activity   Alcohol use: No   Drug use: No   Sexual activity: Not on file  Other Topics Concern   Not on file  Social History Narrative   Household: pt and husband    Social Determinants of Radio broadcast assistant Strain: Not on file   Food Insecurity: Not on file  Transportation Needs: Not on file  Physical Activity: Not on file  Stress: Not on file  Social Connections: Not on file  Intimate Partner Violence: Not on file   Current Outpatient Medications on File Prior to Visit  Medication Sig Dispense Refill   aspirin EC 81 MG tablet Take 81 mg by mouth every evening.     atorvastatin (LIPITOR) 80 MG tablet TAKE 1 TABLET(80 MG) BY MOUTH AT BEDTIME. 90 tablet 3   Blood Glucose Monitoring Suppl (ACCU-CHEK GUIDE) w/Device KIT 1 kit by Does  not apply route See admin instructions. Use to check blood sugar 3 times a day 1 kit 0   clindamycin (CLEOCIN) 300 MG capsule Take 2 capsules (600 mg total) by mouth as directed. Take 2 tablets by mouth 60 minutes prior to dental appointments 2 capsule 3   dicyclomine (BENTYL) 20 MG tablet Take 20 mg by mouth 4 (four) times daily as needed.     glucose blood (ACCU-CHEK GUIDE) test strip Use as instructed to check blood sugar once a day 100 each 3   glycopyrrolate (ROBINUL) 2 MG tablet Take 2 mg by mouth 2 (two) times daily.     metoprolol tartrate (LOPRESSOR) 25 MG tablet TAKE 1 TABLET (25 MG TOTAL) BY MOUTH 2 (TWO) TIMES DAILY. KEEP APPOINTMENT FOR FUTURE REFILLS. 180 tablet 3   Multiple Vitamin (MULTIVITAMIN WITH MINERALS) TABS tablet Take 1 tablet by mouth daily.     nitroGLYCERIN (NITROSTAT) 0.4 MG SL tablet Place 1 tablet (0.4 mg total) under the tongue every 5 (five) minutes as needed for chest pain. 25 tablet 3   ONE TOUCH LANCETS MISC Check blood sugar three times daily 300 each 12   pantoprazole (PROTONIX) 40 MG tablet Take 40 mg by mouth every morning.     No current facility-administered medications on file prior to visit.   Allergies  Allergen Reactions   Advil [Ibuprofen] Anaphylaxis, Swelling and Other (See Comments)    Throat swelling per patient   Penicillins Hives, Swelling and Other (See Comments)    Tongue swelling Has patient had a PCN reaction causing immediate  rash, facial/tongue/throat swelling, SOB or lightheadedness with hypotension: yes Has patient had a PCN reaction causing severe rash involving mucus membranes or skin necrosis: no Has patient had a PCN reaction that required hospitalization no Has patient had a PCN reaction occurring within the last 10 years: no If all of the above answers are "NO", then may proceed with Cepha   Demerol [Meperidine] Nausea And Vomiting   Other Hives and Other (See Comments)    Duck   Cortisone Anxiety and Other (See Comments)    Loopy    Family History  Problem Relation Age of Onset   Heart disease Mother    Heart attack Mother 53   Prostate cancer Father    Diabetes Maternal Grandfather    Diabetes Maternal Uncle    Hashimoto's thyroiditis Daughter    Hashimoto's thyroiditis Son    Breast cancer Neg Hx    PE: BP 132/82 (BP Location: Right Arm, Patient Position: Sitting, Cuff Size: Normal)   Pulse 63   Ht '5\' 1"'$  (1.549 m)   Wt 246 lb (111.6 kg)   SpO2 98%   BMI 46.48 kg/m  Wt Readings from Last 3 Encounters:  10/09/22 246 lb (111.6 kg)  04/03/22 233 lb (105.7 kg)  04/02/22 234 lb 3.2 oz (106.2 kg)   Constitutional: overweight, in NAD Eyes: EOMI, no exophthalmos ENT: no thyromegaly, no cervical lymphadenopathy Cardiovascular: RRR, No RG, +1/6 SEM, + R>L pitting LE edema Respiratory: CTA B Musculoskeletal: no deformities Skin:no rashes Neurological: no tremor with outstretched hands  ASSESSMENT: 1. DM2, non-insulin-dependent, uncontrolled, with complications - CAD - carotid a. Ds. - severe AoSt  2. HL  3. Obesity class III  PLAN:  1. Patient with longstanding, uncontrolled, type 2 diabetes, diet controlled.  We tried metformin but she had GI intolerance with explosive diarrhea repeatedly on the medication.  She could not tolerate the ER formulation, either.  We could not use  SGLT2 inhibitors in the past due to price.  She was reticent to try a GLP-1 receptor agonist due to GI  symptoms in the past from various medications.  At last visit we discussed that the TZD would not be the best idea for her due to leg swelling and weight gain.  However, HbA1c obtained 1 day prior to our last visit was at goal, at 6.6%, improved, and her blood sugars were mostly at goal, so we continued off medications. -At today's visit, sugars are above target in the morning and she is not checking consistently later in the day.  I advised her to check every day, rotating check times.  We discussed about options for treatment.  She is interested in medication that can help with weight loss, but unfortunately, she could not afford an SGLT2 inhibitor and she does not feel that she could tolerate a GLP-1 receptor agonist from the GI point of view.  We discussed that most of the other medications have weight gain as a side effect.  She would really want to work on her diet.  She declines a referral to nutrition for the weight management clinic.  I gave her some suggestions about diet improvement.  For now, per her preference, we will continue without medications.  At next visit, we possibly try a DPP 4 inhibitor. - I suggested to:  Patient Instructions  Please continue off metformin.  Please return in 6 months with your sugar log.   - we checked her HbA1c: 6.9% (higher) - advised to check sugars at different times of the day - 1x a day, rotating check times - advised for yearly eye exams >> she is UTD - return to clinic in 6 months  2. HL -Reviewed latest lipid panel from 03/2022: LDL above target of less than 55, triglycerides very high, HDL low: Lab Results  Component Value Date   CHOL 186 04/02/2022   HDL 35.10 (L) 04/02/2022   LDLCALC 75 10/16/2019   LDLDIRECT 89.0 04/02/2022   TRIG (H) 04/02/2022    495.0 Triglyceride is over 400; calculations on Lipids are invalid.   CHOLHDL 5 04/02/2022  -She continues on Lipitor 80 mg daily without side effects  3.  Obesity class III -Her insurance  did not cover an SGLT2 inhibitor and we held off trying a GLP-1 receptor agonist due to significant GI problems in the past. -Unfortunately we had to stop metformin due to GI intolerance.  This was helping with decreased appetite. -She gained 10 pounds before last visit and 13 since then! -She is aware she needs to improve diet.  Made specific suggestions.  Philemon Kingdom, MD PhD [facility] Endocrinology

## 2022-10-09 NOTE — Patient Instructions (Addendum)
Please continue off metformin.  Please return in 4-6 months with your sugar log.

## 2022-10-16 ENCOUNTER — Ambulatory Visit (INDEPENDENT_AMBULATORY_CARE_PROVIDER_SITE_OTHER): Payer: Medicare Other | Admitting: *Deleted

## 2022-10-16 DIAGNOSIS — Z0000 Encounter for general adult medical examination without abnormal findings: Secondary | ICD-10-CM

## 2022-10-16 NOTE — Patient Instructions (Signed)
Debra Knight , Thank you for taking time to come for your Medicare Wellness Visit. I appreciate your ongoing commitment to your health goals. Please review the following plan we discussed and let me know if I can assist you in the future.   These are the goals we discussed:  Goals   None     This is a list of the screening recommended for you and due dates:  Health Maintenance  Topic Date Due   Hepatitis C Screening: USPSTF Recommendation to screen - Ages 78-79 yo.  Never done   Zoster (Shingles) Vaccine (1 of 2) Never done   DEXA scan (bone density measurement)  Never done   Eye exam for diabetics  11/04/2019   Yearly kidney health urinalysis for diabetes  10/15/2020   COVID-19 Vaccine (4 - 2023-24 season) 05/04/2022   Yearly kidney function blood test for diabetes  04/03/2023   Complete foot exam   04/04/2023   Hemoglobin A1C  04/09/2023   Medicare Annual Wellness Visit  10/17/2023   DTaP/Tdap/Td vaccine (2 - Tdap) 06/03/2028   Pneumonia Vaccine  Completed   Flu Shot  Completed   HPV Vaccine  Aged Out     Next appointment: Follow up in one year for your annual wellness visit.   Preventive Care 78 Years and Older, Female Preventive care refers to lifestyle choices and visits with your health care provider that can promote health and wellness. What does preventive care include? A yearly physical exam. This is also called an annual well check. Dental exams once or twice a year. Routine eye exams. Ask your health care provider how often you should have your eyes checked. Personal lifestyle choices, including: Daily care of your teeth and gums. Regular physical activity. Eating a healthy diet. Avoiding tobacco and drug use. Limiting alcohol use. Practicing safe sex. Taking low-dose aspirin every day. Taking vitamin and mineral supplements as recommended by your health care provider. What happens during an annual well check? The services and screenings done by your health  care provider during your annual well check will depend on your age, overall health, lifestyle risk factors, and family history of disease. Counseling  Your health care provider may ask you questions about your: Alcohol use. Tobacco use. Drug use. Emotional well-being. Home and relationship well-being. Sexual activity. Eating habits. History of falls. Memory and ability to understand (cognition). Work and work Statistician. Reproductive health. Screening  You may have the following tests or measurements: Height, weight, and BMI. Blood pressure. Lipid and cholesterol levels. These may be checked every 5 years, or more frequently if you are over 71 years old. Skin check. Lung cancer screening. You may have this screening every year starting at age 56 if you have a 30-pack-year history of smoking and currently smoke or have quit within the past 15 years. Fecal occult blood test (FOBT) of the stool. You may have this test every year starting at age 31. Flexible sigmoidoscopy or colonoscopy. You may have a sigmoidoscopy every 5 years or a colonoscopy every 10 years starting at age 70. Hepatitis C blood test. Hepatitis B blood test. Sexually transmitted disease (STD) testing. Diabetes screening. This is done by checking your blood sugar (glucose) after you have not eaten for a while (fasting). You may have this done every 1-3 years. Bone density scan. This is done to screen for osteoporosis. You may have this done starting at age 68. Mammogram. This may be done every 1-2 years. Talk to your health care  provider about how often you should have regular mammograms. Talk with your health care provider about your test results, treatment options, and if necessary, the need for more tests. Vaccines  Your health care provider may recommend certain vaccines, such as: Influenza vaccine. This is recommended every year. Tetanus, diphtheria, and acellular pertussis (Tdap, Td) vaccine. You may need a Td  booster every 10 years. Zoster vaccine. You may need this after age 54. Pneumococcal 13-valent conjugate (PCV13) vaccine. One dose is recommended after age 9. Pneumococcal polysaccharide (PPSV23) vaccine. One dose is recommended after age 10. Talk to your health care provider about which screenings and vaccines you need and how often you need them. This information is not intended to replace advice given to you by your health care provider. Make sure you discuss any questions you have with your health care provider. Document Released: 09/16/2015 Document Revised: 05/09/2016 Document Reviewed: 06/21/2015 Elsevier Interactive Patient Education  2017 [place] Prevention in the Home Falls can cause injuries. They can happen to people of all ages. There are many things you can do to make your home safe and to help prevent falls. What can I do on the outside of my home? Regularly fix the edges of walkways and driveways and fix any cracks. Remove anything that might make you trip as you walk through a door, such as a raised step or threshold. Trim any bushes or trees on the path to your home. Use bright outdoor lighting. Clear any walking paths of anything that might make someone trip, such as rocks or tools. Regularly check to see if handrails are loose or broken. Make sure that both sides of any steps have handrails. Any raised decks and porches should have guardrails on the edges. Have any leaves, snow, or ice cleared regularly. Use sand or salt on walking paths during winter. Clean up any spills in your garage right away. This includes oil or grease spills. What can I do in the bathroom? Use night lights. Install grab bars by the toilet and in the tub and shower. Do not use towel bars as grab bars. Use non-skid mats or decals in the tub or shower. If you need to sit down in the shower, use a plastic, non-slip stool. Keep the floor dry. Clean up any water that spills on the floor  as soon as it happens. Remove soap buildup in the tub or shower regularly. Attach bath mats securely with double-sided non-slip rug tape. Do not have throw rugs and other things on the floor that can make you trip. What can I do in the bedroom? Use night lights. Make sure that you have a light by your bed that is easy to reach. Do not use any sheets or blankets that are too big for your bed. They should not hang down onto the floor. Have a firm chair that has side arms. You can use this for support while you get dressed. Do not have throw rugs and other things on the floor that can make you trip. What can I do in the kitchen? Clean up any spills right away. Avoid walking on wet floors. Keep items that you use a lot in easy-to-reach places. If you need to reach something above you, use a strong step stool that has a grab bar. Keep electrical cords out of the way. Do not use floor polish or wax that makes floors slippery. If you must use wax, use non-skid floor wax. Do not have throw rugs  and other things on the floor that can make you trip. What can I do with my stairs? Do not leave any items on the stairs. Make sure that there are handrails on both sides of the stairs and use them. Fix handrails that are broken or loose. Make sure that handrails are as long as the stairways. Check any carpeting to make sure that it is firmly attached to the stairs. Fix any carpet that is loose or worn. Avoid having throw rugs at the top or bottom of the stairs. If you do have throw rugs, attach them to the floor with carpet tape. Make sure that you have a light switch at the top of the stairs and the bottom of the stairs. If you do not have them, ask someone to add them for you. What else can I do to help prevent falls? Wear shoes that: Do not have high heels. Have rubber bottoms. Are comfortable and fit you well. Are closed at the toe. Do not wear sandals. If you use a stepladder: Make sure that it is  fully opened. Do not climb a closed stepladder. Make sure that both sides of the stepladder are locked into place. Ask someone to hold it for you, if possible. Clearly mark and make sure that you can see: Any grab bars or handrails. First and last steps. Where the edge of each step is. Use tools that help you move around (mobility aids) if they are needed. These include: Canes. Walkers. Scooters. Crutches. Turn on the lights when you go into a dark area. Replace any light bulbs as soon as they burn out. Set up your furniture so you have a clear path. Avoid moving your furniture around. If any of your floors are uneven, fix them. If there are any pets around you, be aware of where they are. Review your medicines with your doctor. Some medicines can make you feel dizzy. This can increase your chance of falling. Ask your doctor what other things that you can do to help prevent falls. This information is not intended to replace advice given to you by your health care provider. Make sure you discuss any questions you have with your health care provider. Document Released: 06/16/2009 Document Revised: 01/26/2016 Document Reviewed: 09/24/2014 Elsevier Interactive Patient Education  2017 [employer].

## 2022-10-16 NOTE — Progress Notes (Signed)
Subjective:   Debra Knight is a 77 y.o. female who presents for Medicare Annual (Subsequent) preventive examination.  I connected with  Debra Knight on 10/16/22 by a audio enabled telemedicine application and verified that I am speaking with the correct person using two identifiers.  Patient Location: Home  Provider Location: Office/Clinic  I discussed the limitations of evaluation and management by telemedicine. The patient expressed understanding and agreed to proceed.   Review of Systems    Defer to PCP Cardiac Risk Factors include: advanced age (>68mn, >>93women);dyslipidemia;diabetes mellitus;obesity (BMI >30kg/m2);hypertension     Objective:    There were no vitals filed for this visit. There is no height or weight on file to calculate BMI.     10/16/2022    9:02 AM 04/23/2017    6:00 PM 04/18/2017    2:23 PM 03/19/2017    3:14 PM 01/15/2017    1:31 PM 02/20/2016    2:35 PM 02/20/2016   11:07 AM  Advanced Directives  Does Patient Have a Medical Advance Directive? Yes No No No Yes No No  [place]        Copy of HV[place] Chart? No - copy requested        Would patient like information on creating a medical advance directive?  No - Patient declined No - Patient declined   No - patient declined information No - patient declined information    Current Medications (verified) Outpatient Encounter Medications as of 10/16/2022  Medication Sig   aspirin EC 81 MG tablet Take 81 mg by mouth every evening.   atorvastatin (LIPITOR) 80 MG tablet TAKE 1 TABLET(80 MG) BY MOUTH AT BEDTIME.   Blood Glucose Monitoring Suppl (ACCU-CHEK GUIDE) w/Device KIT 1 kit by Does not apply route See admin instructions. Use to check blood sugar 3 times a day   clindamycin (CLEOCIN) 300 MG capsule Take 2 capsules (600 mg total) by mouth as directed. Take 2 tablets by mouth 60 minutes prior to dental appointments    dicyclomine (BENTYL) 20 MG tablet Take 20 mg by mouth 4 (four) times daily as needed.   glucose blood (ACCU-CHEK GUIDE) test strip Use as instructed to check blood sugar once a day   glycopyrrolate (ROBINUL) 2 MG tablet Take 2 mg by mouth 2 (two) times daily.   metoprolol tartrate (LOPRESSOR) 25 MG tablet TAKE 1 TABLET (25 MG TOTAL) BY MOUTH 2 (TWO) TIMES DAILY. KEEP APPOINTMENT FOR FUTURE REFILLS.   Multiple Vitamin (MULTIVITAMIN WITH MINERALS) TABS tablet Take 1 tablet by mouth daily.   nitroGLYCERIN (NITROSTAT) 0.4 MG SL tablet Place 1 tablet (0.4 mg total) under the tongue every 5 (five) minutes as needed for chest pain.   ONE TOUCH LANCETS MISC Check blood sugar three times daily   pantoprazole (PROTONIX) 40 MG tablet Take 40 mg by mouth every morning.   No facility-administered encounter medications on file as of 10/16/2022.    Allergies (verified) Advil [ibuprofen], Penicillins, Demerol [meperidine], Other, and Cortisone   History: Past Medical History:  Diagnosis Date   Aortic stenosis, severe    a. 04/2017: s/p TAVR wtih a Medtronic Evolut-Pro  (size 29 mm, model # Evolutpro29US, serial # [id number]   CAD (coronary artery disease)    a. pre-TAVR cath 01/2017 showed 80% LCX stenosis. No angina. Plan to treat medically   Enlarged thyroid    GERD (gastroesophageal reflux disease)    Hyperlipidemia  OSA (obstructive sleep apnea)    no cpap    Osteoarthritis of both knees    Tinnitus    Past Surgical History:  Procedure Laterality Date   ANAL RECTAL MANOMETRY N/A 05/03/2021   Procedure: ANO RECTAL MANOMETRY;  Surgeon: Mauri Pole, MD;  Location: [facility];  Service: Endoscopy;  Laterality: N/A;   BIOPSY THYROID  2015   DILATION AND CURETTAGE OF UTERUS     LEFT HEART CATH AND CORONARY ANGIOGRAPHY N/A 01/15/2017   Procedure: Left Heart Cath and Coronary Angiography;  Surgeon: [name], MD;  Location: [facility];  Service: Cardiovascular;  Laterality: N/A;    TEE WITHOUT CARDIOVERSION N/A 04/23/2017   Procedure: TRANSESOPHAGEAL ECHOCARDIOGRAM (TEE);  Surgeon: [name], MD;  Location: [facility];  Service: Open Heart Surgery;  Laterality: N/A;   TONSILLECTOMY  age 22   TOTAL KNEE ARTHROPLASTY Right 12/13/2014   Procedure: RIGHT TOTAL KNEE ARTHROPLASTY;  Surgeon: Gaynelle Arabian, MD;  Location: [facility];  Service: Orthopedics;  Laterality: Right;   TOTAL KNEE ARTHROPLASTY Left 02/20/2016   Procedure: LEFT TOTAL KNEE ARTHROPLASTY;  Surgeon: Gaynelle Arabian, MD;  Location: [facility];  Service: Orthopedics;  Laterality: Left;   TRANSCATHETER AORTIC VALVE REPLACEMENT, TRANSFEMORAL N/A 04/23/2017   Procedure: TRANSCATHETER AORTIC VALVE REPLACEMENT, TRANSFEMORAL;  Surgeon: [name], MD;  Location: [facility];  Service: Open Heart Surgery;  Laterality: N/A;  MEDTRONIC VALVE   Family History  Problem Relation Age of Onset   Heart disease Mother    Heart attack Mother 67   Prostate cancer Father    Diabetes Maternal Grandfather    Diabetes Maternal Uncle    Hashimoto's thyroiditis Daughter    Hashimoto's thyroiditis Son    Breast cancer Neg Hx    Social History   Socioeconomic History   Marital status: Married    Spouse name: Not on file   Number of children: 2   Years of education: Not on file   Highest education level: Not on file  Occupational History   Occupation: self employed  Tobacco Use   Smoking status: Former    Packs/day: 2.00    Years: 15.00    Total pack years: 30.00    Types: Cigarettes    Quit date: 09/03/1968    Years since quitting: 54.1   Smokeless tobacco: Never   Tobacco comments:    1-2 ppd. , NONE IN 40 YEARS  Vaping Use   Vaping Use: Never used  Substance and Sexual Activity   Alcohol use: No   Drug use: No   Sexual activity: Not on file  Other Topics Concern   Not on file  Social History Narrative   Household: pt and husband    Social Determinants of Health   Financial Resource Strain: Low Risk  (10/16/2022)    Overall Financial Resource Strain (CARDIA)    Difficulty of Paying Living Expenses: Not hard at all  Food Insecurity: No Food Insecurity (10/16/2022)   Hunger Vital Sign    Worried About Running Out of Food in the Last Year: Never true    [place] in the Last Year: Never true  Transportation Needs: No Transportation Needs (10/16/2022)   PRAPARE - Hydrologist (Medical): No    Lack of Transportation (Non-Medical): No  Physical Activity: Inactive (10/16/2022)   Exercise Vital Sign    Days of Exercise per Week: 0 days    Minutes of Exercise per Session: 0 min  Stress:  No Stress Concern Present (10/16/2022)   [place]    Feeling of Stress : Not at all  Social Connections: Moderately Integrated (10/16/2022)   Social Connection and Isolation Panel [NHANES]    Frequency of Communication with Friends and Family: More than three times a week    Frequency of Social Gatherings with Friends and Family: Never    Attends Religious Services: 1 to 4 times per year    Active Member of [employer] or Organizations: No    Attends Music therapist: Never    Marital Status: Married    Tobacco Counseling Counseling given: Not Answered Tobacco comments: 1-2 ppd. , NONE IN 40 YEARS   Clinical Intake:  Pre-visit preparation completed: Yes  Pain : No/denies pain  How often do you need to have someone help you when you read instructions, pamphlets, or other written materials from your doctor or pharmacy?: 1 - Never  Diabetic? Nutrition Risk Assessment:  Has the patient had any N/V/D within the last 2 months?  Yes  Does the patient have any non-healing wounds?  No  Has the patient had any unintentional weight loss or weight gain?  No   Diabetes:  Is the patient diabetic?  Yes  If diabetic, was a CBG obtained today?  No  Did the patient bring in their glucometer from home?  No  How often do  you monitor your CBG's? Several times a week.   Financial Strains and Diabetes Management:  Are you having any financial strains with the device, your supplies or your medication? No .  Does the patient want to be seen by Chronic Care Management for management of their diabetes?  No  Would the patient like to be referred to a Nutritionist or for Diabetic Management?  No   Diabetic Exams:  Diabetic Eye Exam: Overdue for diabetic eye exam. Pt has been advised about the importance in completing this exam. Patient advised to call and schedule an eye exam. Diabetic Foot Exam: Completed 04/03/22   Interpreter Needed?: No  Information entered by :: Beatris Ship, [place]   Activities of Daily Living    10/16/2022    9:08 AM  In your present state of health, do you have any difficulty performing the following activities:  Hearing? 1  Vision? 0  Difficulty concentrating or making decisions? 0  Walking or climbing stairs? 0  Dressing or bathing? 0  Doing errands, shopping? 0  Preparing Food and eating ? N  Using the Toilet? N  In the past six months, have you accidently leaked urine? N  Do you have problems with loss of bowel control? Y  Managing your Medications? N  Managing your Finances? N  Housekeeping or managing your Housekeeping? N    Patient Care Team: Copland, Gay Filler, MD as PCP - General (Family Medicine) Josue Hector, MD as PCP - Cardiology (Cardiology) Rigoberto Noel, MD (Pulmonary Disease) Karl Luke, MD as Referring Physician (Optometry)  Indicate any recent Medical Services you may have received from other than [facility] providers in the past year (date may be approximate).     Assessment:   This is a routine wellness examination for Jazzalyn.  Hearing/Vision screen No results found.  Dietary issues and exercise activities discussed: Current Exercise Habits: The patient does not participate in regular exercise at present, Exercise limited by: orthopedic  condition(s)   Goals Addressed   None    Depression Screen    10/16/2022  9:06 AM 04/02/2022    1:52 PM 06/03/2018    2:12 PM  PHQ 2/9 Scores  PHQ - 2 Score 0 0 0    Fall Risk    10/16/2022    9:03 AM 04/02/2022    1:51 PM 06/03/2018    2:12 PM  Fall Risk   Falls in the past year? 0 0 No  Number falls in past yr: 0 0   Injury with Fall? 0 0   Risk for fall due to : No Fall Risks    Follow up Falls evaluation completed      [place]:  Any stairs in or around the home? Yes  If so, are there any without handrails? No  Home free of loose throw rugs in walkways, pet beds, electrical cords, etc? Yes  Adequate lighting in your home to reduce risk of falls? Yes   ASSISTIVE DEVICES UTILIZED TO PREVENT FALLS:  Life alert? No  Use of a cane, walker or w/c? No  Grab bars in the bathroom? No  Shower chair or bench in shower? No  Elevated toilet seat or a handicapped toilet? No   TIMED UP AND GO:  Was the test performed?  No, audio visit .    Cognitive Function:        Immunizations Immunization History  Administered Date(s) Administered   Fluad Quad(high Dose 65+) 06/22/2022   Influenza Split 07/23/2013   Influenza-Unspecified 05/04/2018   PFIZER Comirnaty(Gray Top)Covid-19 Tri-Sucrose Vaccine 09/24/2019, 10/15/2019, 06/22/2020   Pneumococcal Conjugate-13 10/19/2016   Pneumococcal Polysaccharide-23 05/04/2018   Td 06/03/2018    TDAP status: Up to date  Flu Vaccine status: Up to date  Pneumococcal vaccine status: Up to date  Covid-19 vaccine status: Information provided on how to obtain vaccines.   Qualifies for Shingles Vaccine? Yes   Zostavax completed No   Shingrix Completed?: No.    Education has been provided regarding the importance of this vaccine. Patient has been advised to call insurance company to determine out of pocket expense if they have not yet received this vaccine. Advised may also receive vaccine at local  pharmacy or Health Dept. Verbalized acceptance and understanding.  Screening Tests Health Maintenance  Topic Date Due   Medicare Annual Wellness (AWV)  Never done   Hepatitis C Screening  Never done   Zoster Vaccines- Shingrix (1 of 2) Never done   DEXA SCAN  Never done   OPHTHALMOLOGY EXAM  11/04/2019   Diabetic kidney evaluation - Urine ACR  10/15/2020   COVID-19 Vaccine (4 - 2023-24 season) 05/04/2022   Diabetic kidney evaluation - eGFR measurement  04/03/2023   FOOT EXAM  04/04/2023   HEMOGLOBIN A1C  04/09/2023   DTaP/Tdap/Td (2 - Tdap) 06/03/2028   Pneumonia Vaccine 49+ Years old  Completed   INFLUENZA VACCINE  Completed   HPV VACCINES  Aged Out    Health Maintenance  Health Maintenance Due  Topic Date Due   Medicare Annual Wellness (AWV)  Never done   Hepatitis C Screening  Never done   Zoster Vaccines- Shingrix (1 of 2) Never done   DEXA SCAN  Never done   OPHTHALMOLOGY EXAM  11/04/2019   Diabetic kidney evaluation - Urine ACR  10/15/2020   COVID-19 Vaccine (4 - 2023-24 season) 05/04/2022    Colorectal cancer screening: No longer required.   Mammogram status: pt declined  Bone Density status: pt declined  Lung Cancer Screening: (Low Dose CT Chest recommended if Age  55-80 years, 30 pack-year currently smoking OR have quit w/in 15years.) does not qualify.   Additional Screening:  Hepatitis C Screening: does qualify; Completed N/a  Vision Screening: Recommended annual ophthalmology exams for early detection of glaucoma and other disorders of the eye. Is the patient up to date with their annual eye exam?  Yes  Who is the provider or what is the name of the office in which the patient attends annual eye exams? [facility]. If pt is not established with a provider, would they like to be referred to a provider to establish care? No .   Dental Screening: Recommended annual dental exams for proper oral hygiene  Community Resource Referral / Chronic Care  Management: CRR required this visit?  No   CCM required this visit?  No      Plan:     I have personally reviewed and noted the following in the patient's chart:   Medical and social history Use of alcohol, tobacco or illicit drugs  Current medications and supplements including opioid prescriptions. Patient is not currently taking opioid prescriptions. Functional ability and status Nutritional status Physical activity Advanced directives List of other physicians Hospitalizations, surgeries, and ER visits in previous 12 months Vitals Screenings to include cognitive, depression, and falls Referrals and appointments  In addition, I have reviewed and discussed with patient certain preventive protocols, quality metrics, and best practice recommendations. A written personalized care plan for preventive services as well as general preventive health recommendations were provided to patient.   Due to this being a telephonic visit, the after visit summary with patients personalized plan was offered to patient via mail or my-chart. Patient would like to access on my-chart.  Beatris Ship, [place]   10/16/2022   Nurse Notes: None

## 2022-10-19 NOTE — Progress Notes (Signed)
Cardiology Office Note    Date:  10/31/2022   ID:  Debra Knight, DOB 21-May-1946, MRN [id number]  PCP:  Debra Mclean, MD  Cardiologist: Dr. Johnsie Knight / Dr. Burt Knight (TAVR)  CC: TAVR/ CAD   History of Present Illness:  Debra Knight is a 77 y.o. female with a history of CAD, HLD, obesity, DJD and severe aortic stenosis s/p TAVR (04/23/17) with Medtronic 29 mm CoreValve Evolut Pro. This was done with general anesthesia and right TF approach. There was no PVL Cath prior to valve did show severe proximal circumflex disease 80% This has been Rx medically. She was thought to be a poor Open surgical candidate due to need for aortic root enlargement poor functional status . She has not had angina She had a normal Non ischemic myovue 08/01/17  Last TTE reviewed 07/09/19 no PVL mean gradient 8 mmHg stable    She was very upset about her DM. Dr Larose Kells started her on glucophage and it has mad her sick " he did not tell me about side effects" Seeing Dr Gardenia Phlegm Unable to afford Vania Rea / Wilder Glade Trying lower dose ER Metformin  but also GI side effects Currently on no meds   Husband Debra Knight also a patient of mine had repeat cervical surgery at [facility] with some improvement in UE weakness eventually found to have amyloid   TTE 09/29/20 reviewed trivial  PVL normal EF mean gradient 7 mmHg  Myovue 09/29/20 normal no ischemia EF 80%  Thinking of moving to [place] to be with grand kids but having problems selling house    Past Medical History:  Diagnosis Date   Aortic stenosis, severe    a. 04/2017: s/p TAVR wtih a Medtronic Evolut-Pro  (size 29 mm, model # Evolutpro29US, serial # [id number])   CAD (coronary artery disease)    a. pre-TAVR cath 01/2017 showed 80% LCX stenosis. No angina. Plan to treat medically   Enlarged thyroid    GERD (gastroesophageal reflux disease)    Hyperlipidemia    OSA (obstructive sleep apnea)    no cpap    Osteoarthritis of both knees    Tinnitus     Past Surgical History:   Procedure Laterality Date   ANAL RECTAL MANOMETRY N/A 05/03/2021   Procedure: ANO RECTAL MANOMETRY;  Surgeon: Mauri Pole, MD;  Location: [facility];  Service: Endoscopy;  Laterality: N/A;   BIOPSY THYROID  2015   DILATION AND CURETTAGE OF UTERUS     LEFT HEART CATH AND CORONARY ANGIOGRAPHY N/A 01/15/2017   Procedure: Left Heart Cath and Coronary Angiography;  Surgeon: [name], MD;  Location: [place] [facility];  Service: Cardiovascular;  Laterality: N/A;   TEE WITHOUT CARDIOVERSION N/A 04/23/2017   Procedure: TRANSESOPHAGEAL ECHOCARDIOGRAM (TEE);  Surgeon: [name], MD;  Location: [place];  Service: Open Heart Surgery;  Laterality: N/A;   TONSILLECTOMY  age 78   TOTAL KNEE ARTHROPLASTY Right 12/13/2014   Procedure: RIGHT TOTAL KNEE ARTHROPLASTY;  Surgeon: Gaynelle Arabian, MD;  Location: [facility];  Service: Orthopedics;  Laterality: Right;   TOTAL KNEE ARTHROPLASTY Left 02/20/2016   Procedure: LEFT TOTAL KNEE ARTHROPLASTY;  Surgeon: Gaynelle Arabian, MD;  Location: [facility];  Service: Orthopedics;  Laterality: Left;   TRANSCATHETER AORTIC VALVE REPLACEMENT, TRANSFEMORAL N/A 04/23/2017   Procedure: TRANSCATHETER AORTIC VALVE REPLACEMENT, TRANSFEMORAL;  Surgeon: [name], MD;  Location: [place];  Service: Open Heart Surgery;  Laterality: N/A;  MEDTRONIC VALVE    Current Medications: Outpatient Medications Prior to  Visit  Medication Sig Dispense Refill   aspirin EC 81 MG tablet Take 81 mg by mouth every evening.     atorvastatin (LIPITOR) 80 MG tablet TAKE 1 TABLET(80 MG) BY MOUTH AT BEDTIME. 90 tablet 3   Blood Glucose Monitoring Suppl (ACCU-CHEK GUIDE) w/Device KIT 1 kit by Does not apply route See admin instructions. Use to check blood sugar 3 times a day 1 kit 0   clindamycin (CLEOCIN) 300 MG capsule Take 2 capsules (600 mg total) by mouth as directed. Take 2 tablets by mouth 60 minutes prior to dental appointments 2 capsule 3   glucose blood (ACCU-CHEK GUIDE) test strip Use  as instructed to check blood sugar once a day 100 each 3   metoprolol tartrate (LOPRESSOR) 25 MG tablet TAKE 1 TABLET (25 MG TOTAL) BY MOUTH 2 (TWO) TIMES DAILY. KEEP APPOINTMENT FOR FUTURE REFILLS. 180 tablet 3   Multiple Vitamin (MULTIVITAMIN WITH MINERALS) TABS tablet Take 1 tablet by mouth daily.     ONE TOUCH LANCETS MISC Check blood sugar three times daily 300 each 12   nitroGLYCERIN (NITROSTAT) 0.4 MG SL tablet Place 1 tablet (0.4 mg total) under the tongue every 5 (five) minutes as needed for chest pain. 25 tablet 3   dicyclomine (BENTYL) 20 MG tablet Take 20 mg by mouth 4 (four) times daily as needed.     glycopyrrolate (ROBINUL) 2 MG tablet Take 2 mg by mouth 2 (two) times daily.     pantoprazole (PROTONIX) 40 MG tablet Take 40 mg by mouth every morning.     No facility-administered medications prior to visit.     Allergies:   Advil [ibuprofen], Penicillins, Demerol [meperidine], Other, and Cortisone   Social History   Socioeconomic History   Marital status: Married    Spouse name: Not on file   Number of children: 2   Years of education: Not on file   Highest education level: Not on file  Occupational History   Occupation: self employed  Tobacco Use   Smoking status: Former    Packs/day: 2.00    Years: 15.00    Total pack years: 30.00    Types: Cigarettes    Quit date: 09/03/1968    Years since quitting: 54.1   Smokeless tobacco: Never   Tobacco comments:    1-2 ppd. , NONE IN 40 YEARS  Vaping Use   Vaping Use: Never used  Substance and Sexual Activity   Alcohol use: No   Drug use: No   Sexual activity: Not on file  Other Topics Concern   Not on file  Social History Narrative   Household: pt and husband    Social Determinants of Health   Financial Resource Strain: Low Risk  (10/16/2022)   Overall Financial Resource Strain (CARDIA)    Difficulty of Paying Living Expenses: Not hard at all  Food Insecurity: No Food Insecurity (10/16/2022)   Hunger Vital Sign     Worried About Running Out of Food in the Last Year: Never true    [place] in the Last Year: Never true  Transportation Needs: No Transportation Needs (10/16/2022)   PRAPARE - Hydrologist (Medical): No    Lack of Transportation (Non-Medical): No  Physical Activity: Inactive (10/16/2022)   Exercise Vital Sign    Days of Exercise per Week: 0 days    Minutes of Exercise per Session: 0 min  Stress: No Stress Concern Present (10/16/2022)   [employer]  Occupational Health - Occupational Stress Questionnaire    Feeling of Stress : Not at all  Social Connections: Moderately Integrated (10/16/2022)   Social Connection and Isolation Panel [NHANES]    Frequency of Communication with Friends and Family: More than three times a week    Frequency of Social Gatherings with Friends and Family: Never    Attends Religious Services: 1 to 4 times per year    Active Member of [employer] or Organizations: No    Attends Music therapist: Never    Marital Status: Married     Family History:  The patient's family history includes Diabetes in her maternal grandfather and maternal uncle; Hashimoto's thyroiditis in her daughter and son; Heart attack (age of onset: 57) in her mother; Heart disease in her mother; Prostate cancer in her father.      ROS:   Please see the history of present illness.    ROS All other systems reviewed and are negative.   PHYSICAL EXAM:   VS:  BP 132/78   Pulse 62   Ht '5\' 1"'$  (1.549 m)   Wt 248 lb 9.6 oz (112.8 kg)   SpO2 96%   BMI 46.97 kg/m    Affect appropriate Obese white female  HEENT: normal Neck supple with no adenopathy JVP normal no bruits no thyromegaly Lungs clear with no wheezing and good diaphragmatic motion Heart:  S1/S2 SEM  murmur, no rub, gallop or click PMI normal Abdomen: benighn, BS positve, no tenderness, no AAA no bruit.  No HSM or HJR Distal pulses intact with no bruits post RFA cut down for TAVR   No edema Neuro non-focal Skin warm and dry Post bilateral TKR     Wt Readings from Last 3 Encounters:  10/31/22 248 lb 9.6 oz (112.8 kg)  10/09/22 246 lb (111.6 kg)  04/03/22 233 lb (105.7 kg)      Studies/Labs Reviewed:   EKG:  10/31/2022 SR rate  62 PR 228 otherwise normal   Recent Labs: 04/02/2022: ALT 26; BUN 13; Creatinine, Ser 0.74; Hemoglobin 13.0; Platelets 199.0; Potassium 4.5; Sodium 139; TSH 2.72   Lipid Panel    Component Value Date/Time   CHOL 186 04/02/2022 1442   TRIG (H) 04/02/2022 1442    495.0 Triglyceride is over 400; calculations on Lipids are invalid.   HDL 35.10 (L) 04/02/2022 1442   CHOLHDL 5 04/02/2022 1442   VLDL 30.6 10/16/2019 1500   LDLCALC 75 10/16/2019 1500   LDLDIRECT 89.0 04/02/2022 1442    Additional studies/ records that were reviewed today include:  04/23/17 Procedure:      Transcatheter Aortic Valve Replacement - Percutaneous Right Transfemoral Approach             Medtronic Evolut-Pro  (size 29 mm, model # Evolutpro29US, serial # [id number])   Co-Surgeons:            [name], MD and [name], MD     Anesthesiologist:                  Rica Koyanagi, MD   Echocardiographer:              Ena Dawley, MD   Pre-operative Echo Findings: Severe aortic stenosis and moderate AI  Normal left ventricular systolic function   Post-operative Echo Findings: trivial paravalvular leak Normal left ventricular systolic function Normal prosthetic transvalvular gradient of 8 mm Hg.   _____________   Echo  09/29/20   IMPRESSIONS     1.  Left ventricular ejection fraction, by estimation, is 60 to 65%. The  left ventricle has normal function. The left ventricle has no regional  wall motion abnormalities. There is moderate left ventricular hypertrophy.  Left ventricular diastolic  parameters are indeterminate.   2. Right ventricular systolic function is normal. The right ventricular  size is normal. Tricuspid regurgitation  signal is inadequate for assessing  PA pressure.   3. Left atrial size was mildly dilated.   4. The mitral valve is degenerative. Trivial mitral valve regurgitation.  Mild mitral stenosis. The mean mitral valve gradient is 5.0 mmHg with  average heart rate of 59 bpm. Severe mitral annular calcification. MVA by  continuity equation 2.6 cm2. DVI  0.6.   5. Normal aortic valve prosthesis. DI 0.68, AT 70 msec, EOA 2.5 cm2, iEOA  1.29 cm2/m2. The aortic valve has been repaired/replaced. Aortic valve  regurgitation is trivial and appears paravalvular. There is a 29 mm  Medtronic CoreValve-Evolut Pro  prosthetic (TAVR) valve present in the aortic position. Procedure Date:  04/23/17. Aortic valve mean gradient measures 7.0 mmHg.   6. The inferior vena cava is normal in size with <50% respiratory  variability, suggesting right atrial pressure of 8 mmHg.    ASSESSMENT & PLAN:   Severe AS s/p TAVR:  NHYA class 1. Post Evolut Pro 29 mm Medtronic CoreValve stable gradients mean 6 mmHg trivial PVL TTE done 09/29/20   HTN: Well controlled.  Continue current medications and low sodium Dash type diet.    HLD: continue statin   CAD: continue asa, statin and BB circumflex lesion is very tight no angina and normal myovue  09/29/20   LBBB: resolved now only with mild first degree AV block   DM:  Intolerant to mulitple meds including glucophage, SGLT2 with some cost prohibitive Seen by endocrine Gherge and A1c 6.6 so being Rx with diet only   F/U in 6 months   Jenkins Rouge

## 2022-10-31 ENCOUNTER — Ambulatory Visit: Payer: Medicare Other | Attending: Cardiovascular Disease | Admitting: Cardiovascular Disease

## 2022-10-31 ENCOUNTER — Encounter: Payer: Self-pay | Admitting: Cardiovascular Disease

## 2022-10-31 VITALS — BP 132/78 | HR 62 | Ht 61.0 in | Wt 248.6 lb

## 2022-10-31 DIAGNOSIS — E782 Mixed hyperlipidemia: Secondary | ICD-10-CM | POA: Diagnosis not present

## 2022-10-31 DIAGNOSIS — I2510 Atherosclerotic heart disease of native coronary artery without angina pectoris: Secondary | ICD-10-CM | POA: Diagnosis not present

## 2022-10-31 DIAGNOSIS — I10 Essential (primary) hypertension: Secondary | ICD-10-CM

## 2022-10-31 DIAGNOSIS — I447 Left bundle-branch block, unspecified: Secondary | ICD-10-CM | POA: Insufficient documentation

## 2022-10-31 DIAGNOSIS — D3131 Benign neoplasm of right choroid: Secondary | ICD-10-CM | POA: Diagnosis not present

## 2022-10-31 DIAGNOSIS — H25041 Posterior subcapsular polar age-related cataract, right eye: Secondary | ICD-10-CM | POA: Diagnosis not present

## 2022-10-31 DIAGNOSIS — I350 Nonrheumatic aortic (valve) stenosis: Secondary | ICD-10-CM

## 2022-10-31 DIAGNOSIS — H35363 Drusen (degenerative) of macula, bilateral: Secondary | ICD-10-CM | POA: Diagnosis not present

## 2022-10-31 DIAGNOSIS — H25813 Combined forms of age-related cataract, bilateral: Secondary | ICD-10-CM | POA: Diagnosis not present

## 2022-10-31 NOTE — Patient Instructions (Signed)
Medication Instructions:  Your physician recommends that you continue on your current medications as directed. Please refer to the Current Medication list given to you today.  *If you need a refill on your cardiac medications before your next appointment, please call your pharmacy*  Lab Work: If you have labs (blood work) drawn today and your tests are completely normal, you will receive your results only by: MyChart Message (if you have MyChart) OR A paper copy in the mail If you have any lab test that is abnormal or we need to change your treatment, we will call you to review the results.  Testing/Procedures: None ordered today.  Follow-Up: At [facility], you and your health needs are our priority.  As part of our continuing mission to provide you with exceptional heart care, we have created designated Provider Care Teams.  These Care Teams include your primary Cardiologist (physician) and Advanced Practice Providers (APPs -  Physician Assistants and Nurse Practitioners) who all work together to provide you with the care you need, when you need it.  We recommend signing up for the patient portal called "MyChart".  Sign up information is provided on this After Visit Summary.  MyChart is used to connect with patients for Virtual Visits (Telemedicine).  Patients are able to view lab/test results, encounter notes, upcoming appointments, etc.  Non-urgent messages can be sent to your provider as well.   To learn more about what you can do with MyChart, go to [email or web address].    Your next appointment:   6 month(s)  Provider:   Peter Nishan, MD      

## 2022-11-13 DIAGNOSIS — M25551 Pain in right hip: Secondary | ICD-10-CM | POA: Diagnosis not present

## 2022-11-13 DIAGNOSIS — M5451 Vertebrogenic low back pain: Secondary | ICD-10-CM | POA: Diagnosis not present

## 2022-11-14 ENCOUNTER — Telehealth: Payer: Self-pay

## 2022-11-14 DIAGNOSIS — E1165 Type 2 diabetes mellitus with hyperglycemia: Secondary | ICD-10-CM

## 2022-11-14 MED ORDER — GLIPIZIDE ER 5 MG PO TB24: 5.0000 mg | ORAL_TABLET | Freq: Every day | ORAL | 1 refills | Status: DC

## 2022-11-14 NOTE — Telephone Encounter (Signed)
T,  Lets try a dose of glipizide XL 5 mg before breakfast and can increase to 10 mg (2 tablets) before breakfast if the sugars remain elevated.  If this is not enough, we may need to use insulin.  Please let us know.  We can send a prescription for 30 tablets with 1 refill.

## 2022-11-14 NOTE — Telephone Encounter (Signed)
Attempted to contact pt and lvm: Lets try a dose of glipizide XL 5 mg before breakfast and can increase to 10 mg (2 tablets) before breakfast if the sugars remain elevated. If this is not enough, we may need to use insulin. Please let us know. Mychart message sent as well.

## 2022-11-14 NOTE — Telephone Encounter (Signed)
Pt called to advise she was recently given an injection (steroid) and hre blood sugars have been running high. It peaked at 260 and she drank a lot of water and walked to bring it down. Checking every hour her blood sugar was coming down 217 190 164 through the night. However blood sugar has started coming back up and last checked was 172. Pt was also prescribed a 8 day prednisone regimen and wants to know what she can do to control her sugars. She says anything over 160 she feels bad shaking and sweating. She does not want to go back on metformin due to the side effects. Wanted to know your thoughts on Deflazacort. Would it be better to take to help with blood sugar control?

## 2023-01-11 ENCOUNTER — Other Ambulatory Visit: Payer: Self-pay

## 2023-01-11 MED ORDER — NITROGLYCERIN 0.4 MG SL SUBL: 0.4000 mg | SUBLINGUAL_TABLET | SUBLINGUAL | 3 refills | Status: AC | PRN

## 2023-01-11 NOTE — Progress Notes (Unsigned)
Patient requested nitroglycerin refills.

## 2023-01-22 ENCOUNTER — Encounter: Payer: Self-pay | Admitting: Family Medicine

## 2023-03-25 ENCOUNTER — Other Ambulatory Visit: Payer: Self-pay | Admitting: Cardiovascular Disease

## 2023-04-17 NOTE — Progress Notes (Deleted)
Cardiology Office Note    Date:  04/17/2023   ID:  Debra Knight, DOB [birth year], MRN [id number]  PCP:  Pearline Cables, MD  Cardiologist: Dr. Eden Emms / Dr. Excell Seltzer (TAVR)  CC: TAVR/ CAD   History of Present Illness:  Debra Knight is a 77 y.o. female with a history of CAD, HLD, obesity, DJD and severe aortic stenosis s/p TAVR (04/23/17) with Medtronic 29 mm CoreValve Evolut Pro. This was done with general anesthesia and right TF approach. There was no PVL Cath prior to valve did show severe proximal circumflex disease 80% This has been Rx medically. She was thought to be a poor Open surgical candidate due to need for aortic root enlargement poor functional status . She has not had angina She had a normal Non ischemic myovue 08/01/17  Last TTE reviewed 07/09/19 no PVL mean gradient 8 mmHg stable    She was very upset about her DM. Dr Drue Novel started her on glucophage and it has mad her sick " he did not tell me about side effects" Seeing Dr Dan Europe Unable to afford [place] Pepper / Marcelline Deist Trying lower dose ER Metformin  but also GI side effects Currently on no meds   Husband [name] also a patient of mine had repeat cervical surgery at [facility] with some improvement in UE weakness eventually found to have amyloid   TTE 09/29/20 reviewed trivial  PVL normal EF mean gradient 7 mmHg  Myovue 09/29/20 normal no ischemia EF 80%  Thinking of moving to [place] to be with grand kids but having problems selling house   ***   Past Medical History:  Diagnosis Date  . Aortic stenosis, severe    a. 04/2017: s/p TAVR wtih a Medtronic Evolut-Pro  (size 29 mm, model # Evolutpro29US, serial # [id number])  . CAD (coronary artery disease)    a. pre-TAVR cath 01/2017 showed 80% LCX stenosis. No angina. Plan to treat medically  . Enlarged thyroid   . GERD (gastroesophageal reflux disease)   . Hyperlipidemia   . OSA (obstructive sleep apnea)    no cpap   . Osteoarthritis of both knees   . Tinnitus     Past  Surgical History:  Procedure Laterality Date  . ANAL RECTAL MANOMETRY N/A 05/03/2021   Procedure: ANO RECTAL MANOMETRY;  Surgeon: Napoleon Form, MD;  Location: [facility] ENDOSCOPY;  Service: Endoscopy;  Laterality: N/A;  . BIOPSY THYROID  2015  . DILATION AND CURETTAGE OF UTERUS    . LEFT HEART CATH AND CORONARY ANGIOGRAPHY N/A 01/15/2017   Procedure: Left Heart Cath and Coronary Angiography;  Surgeon: Tonny Bollman, MD;  Location: [facility] INVASIVE CV LAB;  Service: Cardiovascular;  Laterality: N/A;  . TEE WITHOUT CARDIOVERSION N/A 04/23/2017   Procedure: TRANSESOPHAGEAL ECHOCARDIOGRAM (TEE);  Surgeon: Tonny Bollman, MD;  Location: [facility] OR;  Service: Open Heart Surgery;  Laterality: N/A;  . TONSILLECTOMY  age 65  . TOTAL KNEE ARTHROPLASTY Right 12/13/2014   Procedure: RIGHT TOTAL KNEE ARTHROPLASTY;  Surgeon: Ollen Gross, MD;  Location: [facility] ORS;  Service: Orthopedics;  Laterality: Right;  . TOTAL KNEE ARTHROPLASTY Left 02/20/2016   Procedure: LEFT TOTAL KNEE ARTHROPLASTY;  Surgeon: Ollen Gross, MD;  Location: [facility] ORS;  Service: Orthopedics;  Laterality: Left;  . TRANSCATHETER AORTIC VALVE REPLACEMENT, TRANSFEMORAL N/A 04/23/2017   Procedure: TRANSCATHETER AORTIC VALVE REPLACEMENT, TRANSFEMORAL;  Surgeon: Tonny Bollman, MD;  Location: [facility] OR;  Service: Open Heart Surgery;  Laterality: N/A;  MEDTRONIC VALVE    Current Medications: Outpatient Medications  Prior to Visit  Medication Sig Dispense Refill  . aspirin EC 81 MG tablet Take 81 mg by mouth every evening.    Marland Kitchen atorvastatin (LIPITOR) 80 MG tablet TAKE 1 TABLET(80 MG) BY MOUTH AT BEDTIME. 90 tablet 3  . Blood Glucose Monitoring Suppl (ACCU-CHEK GUIDE) w/Device KIT 1 kit by Does not apply route See admin instructions. Use to check blood sugar 3 times a day 1 kit 0  . clindamycin (CLEOCIN) 300 MG capsule Take 2 capsules (600 mg total) by mouth as directed. Take 2 tablets by mouth 60 minutes prior to dental appointments 2 capsule 3  . glipiZIDE  (GLUCOTROL XL) 5 MG 24 hr tablet Take 1 tablet (5 mg total) by mouth daily with breakfast. 30 tablet 1  . glucose blood (ACCU-CHEK GUIDE) test strip Use as instructed to check blood sugar once a day 100 each 3  . metoprolol tartrate (LOPRESSOR) 25 MG tablet TAKE 1 TABLET (25 MG TOTAL) BY MOUTH 2 (TWO) TIMES DAILY. KEEP APPOINTMENT FOR FUTURE REFILLS. 180 tablet 3  . Multiple Vitamin (MULTIVITAMIN WITH MINERALS) TABS tablet Take 1 tablet by mouth daily.    . nitroGLYCERIN (NITROSTAT) 0.4 MG SL tablet Place 1 tablet (0.4 mg total) under the tongue every 5 (five) minutes as needed for chest pain. 25 tablet 3  . ONE TOUCH LANCETS MISC Check blood sugar three times daily 300 each 12   No facility-administered medications prior to visit.     Allergies:   Advil [ibuprofen], Penicillins, Demerol [meperidine], Other, and Cortisone   Social History   Socioeconomic History  . Marital status: Married    Spouse name: Not on file  . Number of children: 2  . Years of education: Not on file  . Highest education level: Not on file  Occupational History  . Occupation: self employed  Tobacco Use  . Smoking status: Former    Current packs/day: 0.00    Average packs/day: 2.0 packs/day for 15.0 years (30.0 ttl pk-yrs)    Types: Cigarettes    Start date: 09/03/1953    Quit date: 09/03/1968    Years since quitting: 54.6  . Smokeless tobacco: Never  . Tobacco comments:    1-2 ppd. , NONE IN 40 YEARS  Vaping Use  . Vaping status: Never Used  Substance and Sexual Activity  . Alcohol use: No  . Drug use: No  . Sexual activity: Not on file  Other Topics Concern  . Not on file  Social History Narrative   Household: pt and husband    Social Determinants of Health   Financial Resource Strain: Low Risk  (10/16/2022)   Overall Financial Resource Strain (CARDIA)   . Difficulty of Paying Living Expenses: Not hard at all  Food Insecurity: No Food Insecurity (10/16/2022)   Hunger Vital Sign   . Worried About  Programme researcher, broadcasting/film/video in the Last Year: Never true   . Ran Out of Food in the Last Year: Never true  Transportation Needs: No Transportation Needs (10/16/2022)   PRAPARE - Transportation   . Lack of Transportation (Medical): No   . Lack of Transportation (Non-Medical): No  Physical Activity: Inactive (10/16/2022)   Exercise Vital Sign   . Days of Exercise per Week: 0 days   . Minutes of Exercise per Session: 0 min  Stress: No Stress Concern Present (10/16/2022)   [employer] of Occupational Health - Occupational Stress Questionnaire   . Feeling of Stress : Not at all  Social Connections: Moderately  Integrated (10/16/2022)   Social Connection and Isolation Panel [NHANES]   . Frequency of Communication with Friends and Family: More than three times a week   . Frequency of Social Gatherings with Friends and Family: Never   . Attends Religious Services: 1 to 4 times per year   . Active Member of Clubs or Organizations: No   . Attends Banker Meetings: Never   . Marital Status: Married     Family History:  The patient's family history includes Diabetes in her maternal grandfather and maternal uncle; Hashimoto's thyroiditis in her daughter and son; Heart attack (age of onset: 67) in her mother; Heart disease in her mother; Prostate cancer in her father.      ROS:   Please see the history of present illness.    ROS All other systems reviewed and are negative.   PHYSICAL EXAM:   VS:  There were no vitals taken for this visit.   Affect appropriate Obese white female  HEENT: normal Neck supple with no adenopathy JVP normal no bruits no thyromegaly Lungs clear with no wheezing and good diaphragmatic motion Heart:  S1/S2 SEM  murmur, no rub, gallop or click PMI normal Abdomen: benighn, BS positve, no tenderness, no AAA no bruit.  No HSM or HJR Distal pulses intact with no bruits post RFA cut down for TAVR  No edema Neuro non-focal Skin warm and dry Post bilateral TKR      Wt Readings from Last 3 Encounters:  10/31/22 248 lb 9.6 oz (112.8 kg)  10/09/22 246 lb (111.6 kg)  04/03/22 233 lb (105.7 kg)      Studies/Labs Reviewed:   EKG:  04/17/2023 SR rate  62 PR 228 otherwise normal   Recent Labs: No results found for requested labs within last 365 days.   Lipid Panel    Component Value Date/Time   CHOL 186 04/02/2022 1442   TRIG (H) 04/02/2022 1442    495.0 Triglyceride is over 400; calculations on Lipids are invalid.   HDL 35.10 (L) 04/02/2022 1442   CHOLHDL 5 04/02/2022 1442   VLDL 30.6 10/16/2019 1500   LDLCALC 75 10/16/2019 1500   LDLDIRECT 89.0 04/02/2022 1442    Additional studies/ records that were reviewed today include:  04/23/17 Procedure:      Transcatheter Aortic Valve Replacement - Percutaneous Right Transfemoral Approach             Medtronic Evolut-Pro  (size 29 mm, model # Evolutpro29US, serial # [id number])   Co-Surgeons:            Alleen Borne, MD and Tonny Bollman, MD     Anesthesiologist:                  Sharee Holster, MD   Echocardiographer:              Tobias Alexander, MD   Pre-operative Echo Findings: Severe aortic stenosis and moderate AI  Normal left ventricular systolic function   Post-operative Echo Findings: trivial paravalvular leak Normal left ventricular systolic function Normal prosthetic transvalvular gradient of 8 mm Hg.   _____________   Echo  09/29/20   IMPRESSIONS     1. Left ventricular ejection fraction, by estimation, is 60 to 65%. The  left ventricle has normal function. The left ventricle has no regional  wall motion abnormalities. There is moderate left ventricular hypertrophy.  Left ventricular diastolic  parameters are indeterminate.   2. Right ventricular systolic function is normal. The right  ventricular  size is normal. Tricuspid regurgitation signal is inadequate for assessing  PA pressure.   3. Left atrial size was mildly dilated.   4. The mitral valve is  degenerative. Trivial mitral valve regurgitation.  Mild mitral stenosis. The mean mitral valve gradient is 5.0 mmHg with  average heart rate of 59 bpm. Severe mitral annular calcification. MVA by  continuity equation 2.6 cm2. DVI  0.6.   5. Normal aortic valve prosthesis. DI 0.68, AT 70 msec, EOA 2.5 cm2, iEOA  1.29 cm2/m2. The aortic valve has been repaired/replaced. Aortic valve  regurgitation is trivial and appears paravalvular. There is a 29 mm  Medtronic CoreValve-Evolut Pro  prosthetic (TAVR) valve present in the aortic position. Procedure Date:  04/23/17. Aortic valve mean gradient measures 7.0 mmHg.   6. The inferior vena cava is normal in size with <50% respiratory  variability, suggesting right atrial pressure of 8 mmHg.    ASSESSMENT & PLAN:   Severe AS s/p TAVR:  NHYA class 1. Post Evolut Pro 29 mm Medtronic CoreValve stable gradients mean 6 mmHg trivial PVL TTE done 09/29/20 Update TTE  HTN: Well controlled.  Continue current medications and low sodium Dash type diet.    HLD: continue statin   CAD: continue asa, statin and BB circumflex lesion is very tight no angina and normal myovue  09/29/20   LBBB: resolved now only with mild first degree AV block   DM:  Intolerant to mulitple meds including glucophage, SGLT2 with some cost prohibitive Seen by endocrine Gherge and A1c 6.6 so being Rx with diet only   TTE post TAVR  F/U in 6 months   Charlton Haws

## 2023-04-25 ENCOUNTER — Ambulatory Visit: Payer: Medicare Other | Admitting: Cardiovascular Disease

## 2023-05-14 ENCOUNTER — Encounter: Payer: Self-pay | Admitting: Family Medicine

## 2023-05-14 ENCOUNTER — Ambulatory Visit (INDEPENDENT_AMBULATORY_CARE_PROVIDER_SITE_OTHER): Payer: Medicare Other | Admitting: Family Medicine

## 2023-05-14 VITALS — BP 119/57 | HR 65 | Ht 61.0 in | Wt 248.0 lb

## 2023-05-14 DIAGNOSIS — R600 Localized edema: Secondary | ICD-10-CM | POA: Diagnosis not present

## 2023-05-14 DIAGNOSIS — R748 Abnormal levels of other serum enzymes: Secondary | ICD-10-CM | POA: Diagnosis not present

## 2023-05-14 LAB — CBC WITH DIFFERENTIAL/PLATELET
Basophils Absolute: 0 10*3/uL (ref 0.0–0.1)
Basophils Relative: 0.5 % (ref 0.0–3.0)
Eosinophils Absolute: 0 10*3/uL (ref 0.0–0.7)
Eosinophils Relative: 0.5 % (ref 0.0–5.0)
HCT: 40.5 % (ref 36.0–46.0)
Hemoglobin: 13.1 g/dL (ref 12.0–15.0)
Lymphocytes Relative: 29.7 % (ref 12.0–46.0)
Lymphs Abs: 1.8 10*3/uL (ref 0.7–4.0)
MCHC: 32.3 g/dL (ref 30.0–36.0)
MCV: 90.2 fl (ref 78.0–100.0)
Monocytes Absolute: 0.4 10*3/uL (ref 0.1–1.0)
Monocytes Relative: 7.2 % (ref 3.0–12.0)
Neutro Abs: 3.8 10*3/uL (ref 1.4–7.7)
Neutrophils Relative %: 62.1 % (ref 43.0–77.0)
Platelets: 216 10*3/uL (ref 150.0–400.0)
RBC: 4.49 Mil/uL (ref 3.87–5.11)
RDW: 14.5 % (ref 11.5–15.5)
WBC: 6.2 10*3/uL (ref 4.0–10.5)

## 2023-05-14 LAB — COMPREHENSIVE METABOLIC PANEL
ALT: 20 U/L (ref 0–35)
AST: 17 U/L (ref 0–37)
Albumin: 4 g/dL (ref 3.5–5.2)
Alkaline Phosphatase: 135 U/L — ABNORMAL HIGH (ref 39–117)
BUN: 17 mg/dL (ref 6–23)
CO2: 28 meq/L (ref 19–32)
Calcium: 9.5 mg/dL (ref 8.4–10.5)
Chloride: 102 meq/L (ref 96–112)
Creatinine, Ser: 0.69 mg/dL (ref 0.40–1.20)
GFR: 83.23 mL/min (ref >60.00)
Glucose, Bld: 110 mg/dL — ABNORMAL HIGH (ref 70–99)
Potassium: 4.4 meq/L (ref 3.5–5.1)
Sodium: 141 meq/L (ref 135–145)
Total Bilirubin: 0.7 mg/dL (ref 0.2–1.2)
Total Protein: 6.8 g/dL (ref 6.0–8.3)

## 2023-05-14 LAB — BRAIN NATRIURETIC PEPTIDE: Pro B Natriuretic peptide (BNP): 64 pg/mL (ref 0.0–100.0)

## 2023-05-14 MED ORDER — DOXYCYCLINE HYCLATE 100 MG PO TABS: 100.0000 mg | ORAL_TABLET | Freq: Two times a day (BID) | ORAL | 0 refills | Status: DC

## 2023-05-14 NOTE — Progress Notes (Signed)
Acute Office Visit  Subjective:     Patient ID: Debra Knight, female    DOB: April 08, 1946, 77 y.o.   MRN: [id number]  Chief Complaint  Patient presents with   Leg Problem    HPI Patient is in today for leg swelling and rash.   Discussed the use of AI scribe software for clinical note transcription with the patient, who gave verbal consent to proceed.  History of Present Illness   The patient presents with a several-week history of worsening lower extremity edema and associated pain. They report that the swelling began in May and has progressively worsened, with the lower extremities now swollen up to the knee. The patient also notes the development of a red rash with open sores on the right lower leg over the past two weeks, which intermittently clears up and moves around. The rash is associated with weeping blisters. The patient denies any fever.  The patient reports that the swelling and rash are associated with significant discomfort, particularly at night and in the morning. They describe the pain as stabbing and diffuse, affecting various areas including the heel and the bottom of the foot. The patient denies any chest pain or trouble breathing.          ROS All review of systems negative except what is listed in the HPI      Objective:    BP (!) 119/57   Pulse 65   Ht 5\' 1"  (1.549 m)   Wt 248 lb (112.5 kg)   SpO2 99%   BMI 46.86 kg/m    Physical Exam Vitals reviewed.  Constitutional:      Appearance: Normal appearance.  Cardiovascular:     Rate and Rhythm: Normal rate and regular rhythm.     Heart sounds: Normal heart sounds.  Pulmonary:     Effort: Pulmonary effort is normal.     Breath sounds: Normal breath sounds.  Musculoskeletal:     Right lower leg: Edema present.     Left lower leg: Edema present.  Skin:    General: Skin is warm and dry.     Findings: Rash present.  Neurological:     Mental Status: She is alert and oriented to person, place,  and time.  Psychiatric:        Mood and Affect: Mood normal.        Behavior: Behavior normal.        Thought Content: Thought content normal.        Judgment: Judgment normal.     No results found for any visits on 05/14/23.      Assessment & Plan:   Problem List Items Addressed This Visit   None Visit Diagnoses     Bilateral lower extremity edema    -  Primary   Relevant Medications   doxycycline (VIBRA-TABS) 100 MG tablet   Other Relevant Orders   CBC with Differential/Platelet   Comprehensive metabolic panel   Brain natriuretic peptide     Stasis dermatitis vs cellulitis.  Start doxycycline for possible cellulitis. Take with food and water and add a probiotic mid-day. Do not lay down for at least 30 minutes after antibiotic dose. Caution with sun exposure. Labs today to further evaluate swelling causes Recommend compression socks, elevating legs, and minimizing sodium in the diet.   Please contact office for follow-up if symptoms do not improve or worsen. Seek emergency care if symptoms become severe.  Meds ordered this encounter  Medications   doxycycline (  VIBRA-TABS) 100 MG tablet    Sig: Take 1 tablet (100 mg total) by mouth 2 (two) times daily for 5 days.    Dispense:  10 tablet    Refill:  0    Order Specific Question:   Supervising Provider    Answer:   Danise Edge A [[id number]]    Return if symptoms worsen or fail to improve.  Clayborne Dana, NP

## 2023-05-14 NOTE — Patient Instructions (Signed)
Start doxycycline for possible cellulitis. Take with food and water and add a probiotic mid-day. Do not lay down for at least 30 minutes after antibiotic dose. Caution with sun exposure. Labs today to further evaluate swelling causes Recommend compression socks, elevating legs, and minimizing sodium in the diet.   Please contact office for follow-up if symptoms do not improve or worsen. Seek emergency care if symptoms become severe.

## 2023-05-15 DIAGNOSIS — H25041 Posterior subcapsular polar age-related cataract, right eye: Secondary | ICD-10-CM | POA: Diagnosis not present

## 2023-05-15 DIAGNOSIS — H5712 Ocular pain, left eye: Secondary | ICD-10-CM | POA: Diagnosis not present

## 2023-05-15 DIAGNOSIS — H11152 Pinguecula, left eye: Secondary | ICD-10-CM | POA: Diagnosis not present

## 2023-05-15 DIAGNOSIS — H35453 Secondary pigmentary degeneration, bilateral: Secondary | ICD-10-CM | POA: Diagnosis not present

## 2023-05-15 DIAGNOSIS — H353131 Nonexudative age-related macular degeneration, bilateral, early dry stage: Secondary | ICD-10-CM | POA: Diagnosis not present

## 2023-05-15 DIAGNOSIS — H35363 Drusen (degenerative) of macula, bilateral: Secondary | ICD-10-CM | POA: Diagnosis not present

## 2023-05-15 NOTE — Progress Notes (Signed)
Labs are stable. Alk phos is still slightly elevated, but this has been consistent for you. Last year Dr. Patsy Lager mentioned adding on some labs to further evaluate, so I will order them. Please schedule a fasting lab appointment in the next week or two.  Regarding your swelling. Kidney, liver, and BNP (cardiac fluid marker) are fine, so that is good! - Continue with the plan we discussed and follow-up if not improving or if new symptoms develop.

## 2023-05-15 NOTE — Addendum Note (Signed)
Addended bySilvio Pate on: 05/15/2023 02:59 PM   Modules accepted: Orders

## 2023-05-15 NOTE — Addendum Note (Signed)
Addended by: Hyman Hopes B on: 05/15/2023 08:21 AM   Modules accepted: Orders

## 2023-05-24 ENCOUNTER — Other Ambulatory Visit: Payer: Self-pay | Admitting: Neurology

## 2023-05-24 DIAGNOSIS — R748 Abnormal levels of other serum enzymes: Secondary | ICD-10-CM

## 2023-05-25 LAB — GAMMA GT: GGT: 58 IU/L (ref 0–60)

## 2023-05-28 ENCOUNTER — Encounter: Payer: Self-pay | Admitting: Family Medicine

## 2023-05-28 DIAGNOSIS — R600 Localized edema: Secondary | ICD-10-CM

## 2023-05-28 LAB — ALKALINE PHOSPHATASE, ISOENZYMES
Alkaline Phosphatase: 159 IU/L — ABNORMAL HIGH (ref 44–121)
BONE FRACTION: 36 % (ref 14–68)
INTESTINAL FRAC.: 1 % (ref 0–18)
LIVER FRACTION: 63 % (ref 18–85)

## 2023-05-30 ENCOUNTER — Ambulatory Visit: Payer: Medicare Other | Admitting: Family Medicine

## 2023-05-30 ENCOUNTER — Encounter: Payer: Self-pay | Admitting: Family Medicine

## 2023-05-30 DIAGNOSIS — R600 Localized edema: Secondary | ICD-10-CM | POA: Diagnosis not present

## 2023-05-30 MED ORDER — FUROSEMIDE 20 MG PO TABS: 20.0000 mg | ORAL_TABLET | Freq: Every day | ORAL | 0 refills | Status: DC

## 2023-05-30 MED ORDER — DOXYCYCLINE HYCLATE 100 MG PO TABS: 100.0000 mg | ORAL_TABLET | Freq: Two times a day (BID) | ORAL | 0 refills | Status: AC

## 2023-05-30 MED ORDER — TRIAMCINOLONE ACETONIDE 0.1 % EX CREA: 1.0000 | TOPICAL_CREAM | Freq: Two times a day (BID) | CUTANEOUS | 0 refills | Status: DC

## 2023-05-30 NOTE — Progress Notes (Signed)
Acute Office Visit  Subjective:     Patient ID: Debra Knight, female    DOB: Jun 02, 1946, 77 y.o.   MRN: [id number]  Chief Complaint  Patient presents with   Leg Problem    HPI Patient is in today for edema.   Discussed the use of AI scribe software for clinical note transcription with the patient, who gave verbal consent to proceed.  History of Present Illness   The patient presents with a worsening skin condition, previously treated with doxycycline due to concerns of infection. They report that the medication healed one significant lesion, but the overall condition has deteriorated. The patient describes the presence of multiple blisters, some of which have ruptured, and a sensation of burning and itching. The affected area is also weeping. The patient has been applying Neosporin to the area and covering it with gauze to prevent contamination.  Left leg is also swollen with some early signs of blisters developing. The patient reports a sensation of tightness in the affected areas, likening it to wearing a compression bandage. The patient also notes that the swelling in the legs, which has been present for some time, has decreased, although not significantly.  The patient has been managing the condition at home, taking care to keep the area clean and dry. They have been avoiding hot showers to prevent damage to the new tissue under the blisters. The patient has been experiencing some difficulty with mobility due to the condition, as they can only wear certain types of footwear.  The patient denies any chest pain or shortness of breath. They report a stable weight and no significant changes in their overall health. The patient's blood pressure was noted to be slightly elevated initially today.          Wt Readings from Last 3 Encounters:  05/30/23 248 lb (112.5 kg)  05/14/23 248 lb (112.5 kg)  10/31/22 248 lb 9.6 oz (112.8 kg)     ROS All review of systems negative except  what is listed in the HPI      Objective:    BP (!) 133/50   Pulse 65   Ht 5\' 1"  (1.549 m)   Wt 248 lb (112.5 kg)   SpO2 98%   BMI 46.86 kg/m    Physical Exam Vitals reviewed.  Constitutional:      Appearance: Normal appearance.  Cardiovascular:     Rate and Rhythm: Normal rate and regular rhythm.     Heart sounds: Normal heart sounds.  Pulmonary:     Effort: Pulmonary effort is normal.     Breath sounds: Normal breath sounds.  Musculoskeletal:     Right lower leg: Edema present.     Left lower leg: Edema present.  Skin:    General: Skin is warm and dry.     Comments: weeping  Neurological:     Mental Status: She is alert and oriented to person, place, and time.  Psychiatric:        Mood and Affect: Mood normal.        Behavior: Behavior normal.        Thought Content: Thought content normal.        Judgment: Judgment normal.              No results found for any visits on 05/30/23.      Assessment & Plan:   Problem List Items Addressed This Visit   None Visit Diagnoses     Bilateral lower extremity  edema       Relevant Medications   doxycycline (VIBRA-TABS) 100 MG tablet   triamcinolone cream (KENALOG) 0.1 %   furosemide (LASIX) 20 MG tablet          Venous Insufficiency with Stasis Dermatitis Worsening of skin condition with new blisters and weeping. No signs of systemic infection. Likely due to venous insufficiency. -Given that right leg is significantly worse than left, will try extending doxy for possible cellulitis. -Adding 3 days of lasix (renal function stable) and triamcinolone cream for stasis dermatitis. -Advise to elevate legs when seated and avoid standing for long periods. -Consider use of compression socks on both feet, applied in the morning before getting out of bed. -Recommend recheck edema and renal function early next week.       Meds ordered this encounter  Medications   doxycycline (VIBRA-TABS) 100 MG tablet     Sig: Take 1 tablet (100 mg total) by mouth 2 (two) times daily for 5 days.    Dispense:  10 tablet    Refill:  0    Order Specific Question:   Supervising Provider    Answer:   Danise Edge A [[id number]]   triamcinolone cream (KENALOG) 0.1 %    Sig: Apply 1 Application topically 2 (two) times daily.    Dispense:  30 g    Refill:  0    Order Specific Question:   Supervising Provider    Answer:   Danise Edge A [[id number]]   furosemide (LASIX) 20 MG tablet    Sig: Take 1 tablet (20 mg total) by mouth daily.    Dispense:  3 tablet    Refill:  0    Order Specific Question:   Supervising Provider    Answer:   Danise Edge A [[id number]]    Return if symptoms worsen or fail to improve. Recommend recheck early next week to evaluate edema and renal function after short trial of diuretic.   Clayborne Dana, NP

## 2023-06-07 MED ORDER — FUROSEMIDE 40 MG PO TABS: 40.0000 mg | ORAL_TABLET | Freq: Every day | ORAL | 0 refills | Status: DC

## 2023-06-09 DIAGNOSIS — R2240 Localized swelling, mass and lump, unspecified lower limb: Secondary | ICD-10-CM | POA: Diagnosis not present

## 2023-06-09 DIAGNOSIS — R600 Localized edema: Secondary | ICD-10-CM | POA: Diagnosis not present

## 2023-06-09 DIAGNOSIS — Z87891 Personal history of nicotine dependence: Secondary | ICD-10-CM | POA: Diagnosis not present

## 2023-06-09 DIAGNOSIS — M7989 Other specified soft tissue disorders: Secondary | ICD-10-CM | POA: Diagnosis not present

## 2023-06-10 ENCOUNTER — Encounter: Payer: Self-pay | Admitting: Family Medicine

## 2023-06-10 DIAGNOSIS — R600 Localized edema: Secondary | ICD-10-CM

## 2023-06-18 DIAGNOSIS — L928 Other granulomatous disorders of the skin and subcutaneous tissue: Secondary | ICD-10-CM | POA: Diagnosis not present

## 2023-06-18 DIAGNOSIS — R600 Localized edema: Secondary | ICD-10-CM | POA: Diagnosis not present

## 2023-06-18 DIAGNOSIS — L539 Erythematous condition, unspecified: Secondary | ICD-10-CM | POA: Diagnosis not present

## 2023-06-18 DIAGNOSIS — Z952 Presence of prosthetic heart valve: Secondary | ICD-10-CM | POA: Diagnosis not present

## 2023-06-18 DIAGNOSIS — I872 Venous insufficiency (chronic) (peripheral): Secondary | ICD-10-CM | POA: Diagnosis not present

## 2023-06-18 DIAGNOSIS — E785 Hyperlipidemia, unspecified: Secondary | ICD-10-CM | POA: Diagnosis not present

## 2023-06-18 DIAGNOSIS — I890 Lymphedema, not elsewhere classified: Secondary | ICD-10-CM | POA: Diagnosis not present

## 2023-06-19 ENCOUNTER — Telehealth: Payer: Self-pay | Admitting: Cardiovascular Disease

## 2023-06-19 DIAGNOSIS — I350 Nonrheumatic aortic (valve) stenosis: Secondary | ICD-10-CM

## 2023-06-19 NOTE — Telephone Encounter (Signed)
Called patient back with Dr. Fabio Bering advisement.   Debra Stade, MD  to Me     06/19/23 11:40 AM This is not CHF She has had two normal BNP's recently and last echo TAVR valve normal and normal EF Primary should put her on daily dose of lasix and continue f/u wound center Likely LE venous dx. Can update TTE since last one done 2022  Will place order for echo. Made patient an appointment with Dr. Eden Emms in December.

## 2023-06-19 NOTE — Telephone Encounter (Signed)
Spoke with patient and she stated she wanted a call back from [facility]

## 2023-06-19 NOTE — Telephone Encounter (Signed)
Pt states that she would like a callback from nurse Pam. Please advise

## 2023-06-19 NOTE — Telephone Encounter (Signed)
Called patient back about message. Patient is complaining of swelling in BLE from mid-calf to her feet. Patient stated she had blistering and weeping and went to her PCP, who checked for infections and gave her antibiotics. Patient stated her legs got worse so she went to ER in [employer] and they sent her to the wound clinic. Patient stated they have been treating her legs and her left leg is somewhat better but still swollen. Patient stated she still has weeping on her right leg. Patient stated her wound doctor advise her to see cardiology that she could have heart failure. Patient denies any other symptoms. Patient has follow-up with Dr. Eden Emms in 2025, was going to see if we could get her added on to a short day with Dr. Eden Emms, get labwork, or get any testing done. Will forward to Dr. Eden Emms for advisement.

## 2023-06-21 ENCOUNTER — Telehealth: Payer: Self-pay | Admitting: Family Medicine

## 2023-06-21 DIAGNOSIS — I890 Lymphedema, not elsewhere classified: Secondary | ICD-10-CM | POA: Diagnosis not present

## 2023-06-21 DIAGNOSIS — I872 Venous insufficiency (chronic) (peripheral): Secondary | ICD-10-CM | POA: Diagnosis not present

## 2023-06-21 DIAGNOSIS — L928 Other granulomatous disorders of the skin and subcutaneous tissue: Secondary | ICD-10-CM | POA: Diagnosis not present

## 2023-06-21 DIAGNOSIS — E785 Hyperlipidemia, unspecified: Secondary | ICD-10-CM | POA: Diagnosis not present

## 2023-06-21 DIAGNOSIS — L539 Erythematous condition, unspecified: Secondary | ICD-10-CM | POA: Diagnosis not present

## 2023-06-21 DIAGNOSIS — R600 Localized edema: Secondary | ICD-10-CM | POA: Diagnosis not present

## 2023-06-21 MED ORDER — FUROSEMIDE 20 MG PO TABS: 20.0000 mg | ORAL_TABLET | Freq: Every day | ORAL | 3 refills | Status: DC

## 2023-06-21 NOTE — Telephone Encounter (Signed)
See my chart message

## 2023-06-21 NOTE — Telephone Encounter (Signed)
[name]/Dr Copland - if you want to look at Cardiology note about medication.

## 2023-06-21 NOTE — Telephone Encounter (Signed)
Pt called and requested to speak with pcp about her medication. I asked pt several times what the message was regarding but pt refused and preferred to speak with pcp directly. Please call and advise.

## 2023-06-25 DIAGNOSIS — I872 Venous insufficiency (chronic) (peripheral): Secondary | ICD-10-CM | POA: Diagnosis not present

## 2023-06-25 DIAGNOSIS — I83019 Varicose veins of right lower extremity with ulcer of unspecified site: Secondary | ICD-10-CM | POA: Diagnosis not present

## 2023-06-25 DIAGNOSIS — L539 Erythematous condition, unspecified: Secondary | ICD-10-CM | POA: Diagnosis not present

## 2023-06-25 DIAGNOSIS — R600 Localized edema: Secondary | ICD-10-CM | POA: Diagnosis not present

## 2023-06-25 DIAGNOSIS — L928 Other granulomatous disorders of the skin and subcutaneous tissue: Secondary | ICD-10-CM | POA: Diagnosis not present

## 2023-06-25 DIAGNOSIS — I890 Lymphedema, not elsewhere classified: Secondary | ICD-10-CM | POA: Diagnosis not present

## 2023-06-25 DIAGNOSIS — L97919 Non-pressure chronic ulcer of unspecified part of right lower leg with unspecified severity: Secondary | ICD-10-CM | POA: Diagnosis not present

## 2023-06-25 DIAGNOSIS — E785 Hyperlipidemia, unspecified: Secondary | ICD-10-CM | POA: Diagnosis not present

## 2023-06-28 DIAGNOSIS — L928 Other granulomatous disorders of the skin and subcutaneous tissue: Secondary | ICD-10-CM | POA: Diagnosis not present

## 2023-06-28 DIAGNOSIS — L539 Erythematous condition, unspecified: Secondary | ICD-10-CM | POA: Diagnosis not present

## 2023-06-28 DIAGNOSIS — L97812 Non-pressure chronic ulcer of other part of right lower leg with fat layer exposed: Secondary | ICD-10-CM | POA: Diagnosis not present

## 2023-06-28 DIAGNOSIS — R600 Localized edema: Secondary | ICD-10-CM | POA: Diagnosis not present

## 2023-06-28 DIAGNOSIS — E785 Hyperlipidemia, unspecified: Secondary | ICD-10-CM | POA: Diagnosis not present

## 2023-06-28 DIAGNOSIS — I890 Lymphedema, not elsewhere classified: Secondary | ICD-10-CM | POA: Diagnosis not present

## 2023-06-28 DIAGNOSIS — I83891 Varicose veins of right lower extremities with other complications: Secondary | ICD-10-CM | POA: Diagnosis not present

## 2023-06-28 DIAGNOSIS — I872 Venous insufficiency (chronic) (peripheral): Secondary | ICD-10-CM | POA: Diagnosis not present

## 2023-06-28 DIAGNOSIS — I83018 Varicose veins of right lower extremity with ulcer other part of lower leg: Secondary | ICD-10-CM | POA: Diagnosis not present

## 2023-07-01 MED ORDER — SULFAMETHOXAZOLE-TRIMETHOPRIM 800-160 MG PO TABS: 1.0000 | ORAL_TABLET | Freq: Two times a day (BID) | ORAL | 0 refills | Status: DC

## 2023-07-01 NOTE — Addendum Note (Signed)
Addended by: Abbe Amsterdam C on: 07/01/2023 04:47 PM   Modules accepted: Orders

## 2023-07-02 DIAGNOSIS — L928 Other granulomatous disorders of the skin and subcutaneous tissue: Secondary | ICD-10-CM | POA: Diagnosis not present

## 2023-07-02 DIAGNOSIS — E785 Hyperlipidemia, unspecified: Secondary | ICD-10-CM | POA: Diagnosis not present

## 2023-07-02 DIAGNOSIS — L97919 Non-pressure chronic ulcer of unspecified part of right lower leg with unspecified severity: Secondary | ICD-10-CM | POA: Diagnosis not present

## 2023-07-02 DIAGNOSIS — I872 Venous insufficiency (chronic) (peripheral): Secondary | ICD-10-CM | POA: Diagnosis not present

## 2023-07-02 DIAGNOSIS — I83019 Varicose veins of right lower extremity with ulcer of unspecified site: Secondary | ICD-10-CM | POA: Diagnosis not present

## 2023-07-02 DIAGNOSIS — L539 Erythematous condition, unspecified: Secondary | ICD-10-CM | POA: Diagnosis not present

## 2023-07-02 DIAGNOSIS — I890 Lymphedema, not elsewhere classified: Secondary | ICD-10-CM | POA: Diagnosis not present

## 2023-07-02 DIAGNOSIS — R600 Localized edema: Secondary | ICD-10-CM | POA: Diagnosis not present

## 2023-07-04 DIAGNOSIS — E119 Type 2 diabetes mellitus without complications: Secondary | ICD-10-CM | POA: Diagnosis not present

## 2023-07-04 DIAGNOSIS — H353131 Nonexudative age-related macular degeneration, bilateral, early dry stage: Secondary | ICD-10-CM | POA: Diagnosis not present

## 2023-07-04 DIAGNOSIS — H52223 Regular astigmatism, bilateral: Secondary | ICD-10-CM | POA: Diagnosis not present

## 2023-07-04 DIAGNOSIS — H43812 Vitreous degeneration, left eye: Secondary | ICD-10-CM | POA: Diagnosis not present

## 2023-07-04 DIAGNOSIS — H25813 Combined forms of age-related cataract, bilateral: Secondary | ICD-10-CM | POA: Diagnosis not present

## 2023-07-04 DIAGNOSIS — H5213 Myopia, bilateral: Secondary | ICD-10-CM | POA: Diagnosis not present

## 2023-07-04 DIAGNOSIS — H524 Presbyopia: Secondary | ICD-10-CM | POA: Diagnosis not present

## 2023-07-04 LAB — HM DIABETES EYE EXAM

## 2023-07-05 DIAGNOSIS — I890 Lymphedema, not elsewhere classified: Secondary | ICD-10-CM | POA: Diagnosis not present

## 2023-07-05 DIAGNOSIS — I872 Venous insufficiency (chronic) (peripheral): Secondary | ICD-10-CM | POA: Diagnosis not present

## 2023-07-05 DIAGNOSIS — I83018 Varicose veins of right lower extremity with ulcer other part of lower leg: Secondary | ICD-10-CM | POA: Diagnosis not present

## 2023-07-05 DIAGNOSIS — L97812 Non-pressure chronic ulcer of other part of right lower leg with fat layer exposed: Secondary | ICD-10-CM | POA: Diagnosis not present

## 2023-07-05 DIAGNOSIS — E11622 Type 2 diabetes mellitus with other skin ulcer: Secondary | ICD-10-CM | POA: Diagnosis not present

## 2023-07-05 DIAGNOSIS — I83891 Varicose veins of right lower extremities with other complications: Secondary | ICD-10-CM | POA: Diagnosis not present

## 2023-07-10 ENCOUNTER — Ambulatory Visit (HOSPITAL_COMMUNITY): Payer: Medicare Other | Attending: Cardiovascular Disease

## 2023-07-10 DIAGNOSIS — I350 Nonrheumatic aortic (valve) stenosis: Secondary | ICD-10-CM | POA: Insufficient documentation

## 2023-07-10 LAB — ECHOCARDIOGRAM COMPLETE
AR max vel: 2.93 cm2
AV Area VTI: 2.97 cm2
AV Area mean vel: 3.02 cm2
AV Mean grad: 9 mm[Hg]
AV Peak grad: 16.6 mm[Hg]
Ao pk vel: 2.04 m/s
Area-P 1/2: 3.48 cm2
MV VTI: 2.52 cm2
S' Lateral: 2.4 cm

## 2023-07-14 NOTE — Progress Notes (Unsigned)
[facility] [address], [address] [place], [place] [address] [phone]  Date:  07/17/2023   Name:  Debra Knight   DOB:  1945/10/11   MRN:  [id number]  PCP:  Pearline Cables, MD    Chief Complaint: No chief complaint on file.   History of Present Illness:  Debra Knight is a 77 y.o. very pleasant female patient who presents with the following:  Pt seen today for recheck and a flu shot Last seen by myself 7/23:  Seen today as a new patient on request of Dr Myna Hidalgo  History of DM, CAD, HTN, hyperlipidemia, OSA, AS s/p TAVR Cardiologist is Dr Eden Emms - seen in April  Debra Knight is a 77 y.o. female with a history of CAD, HLD, obesity, DJD and severe aortic stenosis s/p TAVR (04/23/17) with Medtronic 29 mm CoreValve Evolut Pro. This was done with general anesthesia and right TF approach. There was no PVL Cath prior to valve did show severe proximal circumflex disease 80% This has been Rx medically. She was thought to be a poor Open surgical candidate due to need for aortic root enlargement poor functional status . She has not had angina She had a normal Non ischemic myovue 08/01/17  Last TTE reviewed 07/09/19 no PVL mean gradient 8 mmHg stable   She was very upset about her DM. Dr Drue Novel started her on glucophage and it has mad her sick " he did not tell me about side effects" Seeing Dr Dan Europe Unable to afford London Pepper / Marcelline Deist Trying lower dose ER Metformin A1c 6.9 10/16/19  [facility]   Eye exam Urine micro Flu shot Foot exam A1c Hep C screening Shingrix Bone density  Lab Results  Component Value Date   HGBA1C 6.9 (A) 10/09/2022     Patient Active Problem List   Diagnosis Date Noted   Incontinence of feces    Lung nodule 08/26/2018   PCP NOTE >>>>>>>>>>>>>>>> 06/04/2018   Poorly controlled type 2 diabetes mellitus with circulatory disorder (HCC) 06/04/2018   Aortic stenosis, severe    Carotid  artery disease (HCC)    CAD (coronary artery disease)    Arthritis of knee, degenerative 12/13/2014   Osteoarthritis of both knees    Abnormal thyroid exam    HTN (hypertension) 09/11/2013   Hyperlipidemia    OSA (obstructive sleep apnea) 04/09/2013    Past Medical History:  Diagnosis Date   Aortic stenosis, severe    a. 04/2017: s/p TAVR wtih a Medtronic Evolut-Pro  (size 29 mm, model # Evolutpro29US, serial # [id number])   CAD (coronary artery disease)    a. pre-TAVR cath 01/2017 showed 80% LCX stenosis. No angina. Plan to treat medically   Enlarged thyroid    GERD (gastroesophageal reflux disease)    Hyperlipidemia    OSA (obstructive sleep apnea)    no cpap    Osteoarthritis of both knees    Tinnitus     Past Surgical History:  Procedure Laterality Date   ANAL RECTAL MANOMETRY N/A 05/03/2021   Procedure: ANO RECTAL MANOMETRY;  Surgeon: Napoleon Form, MD;  Location: [facility];  Service: Endoscopy;  Laterality: N/A;   BIOPSY THYROID  2015   DILATION AND CURETTAGE OF UTERUS     LEFT HEART CATH AND CORONARY ANGIOGRAPHY N/A 01/15/2017   Procedure: Left Heart Cath and Coronary Angiography;  Surgeon: Tonny Bollman, MD;  Location: [facility] INVASIVE CV LAB;  Service: Cardiovascular;  Laterality: N/A;   TEE WITHOUT CARDIOVERSION N/A 04/23/2017   Procedure: TRANSESOPHAGEAL ECHOCARDIOGRAM (TEE);  Surgeon: Tonny Bollman, MD;  Location: [facility] OR;  Service: Open Heart Surgery;  Laterality: N/A;   TONSILLECTOMY  age 13   TOTAL KNEE ARTHROPLASTY Right 12/13/2014   Procedure: RIGHT TOTAL KNEE ARTHROPLASTY;  Surgeon: Ollen Gross, MD;  Location: [facility];  Service: Orthopedics;  Laterality: Right;   TOTAL KNEE ARTHROPLASTY Left 02/20/2016   Procedure: LEFT TOTAL KNEE ARTHROPLASTY;  Surgeon: Ollen Gross, MD;  Location: [facility];  Service: Orthopedics;  Laterality: Left;   TRANSCATHETER AORTIC VALVE REPLACEMENT, TRANSFEMORAL N/A 04/23/2017   Procedure: TRANSCATHETER AORTIC VALVE REPLACEMENT,  TRANSFEMORAL;  Surgeon: Tonny Bollman, MD;  Location[facility] OR;  Service: Open Heart Surgery;  Laterality: N/A;  MEDTRONIC VALVE    Social History   Tobacco Use   Smoking status: Former    Current packs/day: 0.00    Average packs/day: 2.0 packs/day for 15.0 years (30.0 ttl pk-yrs)    Types: Cigarettes    Start date: 09/03/1953    Quit date: 09/03/1968    Years since quitting: 54.8   Smokeless tobacco: Never   Tobacco comments:    1-2 ppd. , NONE IN 40 YEARS  Vaping Use   Vaping status: Never Used  Substance Use Topics   Alcohol use: No   Drug use: No    Family History  Problem Relation Age of Onset   Heart disease Mother    Heart attack Mother 11   Prostate cancer Father    Diabetes Maternal Grandfather    Diabetes Maternal Uncle    Hashimoto's thyroiditis Daughter    Hashimoto's thyroiditis Son    Breast cancer Neg Hx     Allergies  Allergen Reactions   Advil [Ibuprofen] Anaphylaxis, Swelling and Other (See Comments)    Throat swelling per patient   Penicillins Hives, Swelling and Other (See Comments)    Tongue swelling Has patient had a PCN reaction causing immediate rash, facial/tongue/throat swelling, SOB or lightheadedness with hypotension: yes Has patient had a PCN reaction causing severe rash involving mucus membranes or skin necrosis: no Has patient had a PCN reaction that required hospitalization no Has patient had a PCN reaction occurring within the last 10 years: no If all of the above answers are "NO", then may proceed with Cepha   Demerol [Meperidine] Nausea And Vomiting   Other Hives and Other (See Comments)    Duck   Cortisone Anxiety and Other (See Comments)    Loopy     Medication list has been reviewed and updated.  Current Outpatient Medications on File Prior to Visit  Medication Sig Dispense Refill   aspirin EC 81 MG tablet Take 81 mg by mouth every evening.     atorvastatin (LIPITOR) 80 MG tablet TAKE 1 TABLET(80 MG) BY MOUTH AT BEDTIME. 90  tablet 3   Blood Glucose Monitoring Suppl (ACCU-CHEK GUIDE) w/Device KIT 1 kit by Does not apply route See admin instructions. Use to check blood sugar 3 times a day 1 kit 0   clindamycin (CLEOCIN) 300 MG capsule Take 2 capsules (600 mg total) by mouth as directed. Take 2 tablets by mouth 60 minutes prior to dental appointments 2 capsule 3   furosemide (LASIX) 20 MG tablet Take 1 tablet (20 mg total) by mouth daily. 30 tablet 3   glucose blood (ACCU-CHEK GUIDE) test strip Use as instructed to check blood sugar once a day 100 each 3   metoprolol tartrate (LOPRESSOR) 25  MG tablet TAKE 1 TABLET (25 MG TOTAL) BY MOUTH 2 (TWO) TIMES DAILY. KEEP APPOINTMENT FOR FUTURE REFILLS. 180 tablet 3   Multiple Vitamin (MULTIVITAMIN WITH MINERALS) TABS tablet Take 1 tablet by mouth daily.     nitroGLYCERIN (NITROSTAT) 0.4 MG SL tablet Place 1 tablet (0.4 mg total) under the tongue every 5 (five) minutes as needed for chest pain. 25 tablet 3   ONE TOUCH LANCETS MISC Check blood sugar three times daily 300 each 12   sulfamethoxazole-trimethoprim (BACTRIM DS) 800-160 MG tablet Take 1 tablet by mouth 2 (two) times daily. 14 tablet 0   triamcinolone cream (KENALOG) 0.1 % Apply 1 Application topically 2 (two) times daily. 30 g 0   No current facility-administered medications on file prior to visit.    Review of Systems:  As per HPI- otherwise negative.   Physical Examination: There were no vitals filed for this visit. There were no vitals filed for this visit. There is no height or weight on file to calculate BMI. Ideal Body Weight:    GEN: no acute distress. HEENT: Atraumatic, Normocephalic.  Ears and Nose: No external deformity. CV: RRR, No M/G/R. No JVD. No thrill. No extra heart sounds. PULM: CTA B, no wheezes, crackles, rhonchi. No retractions. No resp. distress. No accessory muscle use. ABD: S, NT, ND, +BS. No rebound. No HSM. EXTR: No c/c/e PSYCH: Normally interactive. Conversant.    Assessment  and Plan: ***  Signed Abbe Amsterdam, MD

## 2023-07-14 NOTE — Patient Instructions (Incomplete)
It was good to see you again today- I will be in touch with your labs Recommend routine screening mammogram and bone density Let's have you start on venlefaxine at 37.5 mg daily- we can go up in a couple of weeks as needed, please let me know how this seems to work for you  [employer]

## 2023-07-16 DIAGNOSIS — L97919 Non-pressure chronic ulcer of unspecified part of right lower leg with unspecified severity: Secondary | ICD-10-CM | POA: Diagnosis not present

## 2023-07-16 DIAGNOSIS — L97812 Non-pressure chronic ulcer of other part of right lower leg with fat layer exposed: Secondary | ICD-10-CM | POA: Diagnosis not present

## 2023-07-16 DIAGNOSIS — I83019 Varicose veins of right lower extremity with ulcer of unspecified site: Secondary | ICD-10-CM | POA: Diagnosis not present

## 2023-07-16 DIAGNOSIS — I890 Lymphedema, not elsewhere classified: Secondary | ICD-10-CM | POA: Diagnosis not present

## 2023-07-16 DIAGNOSIS — E11622 Type 2 diabetes mellitus with other skin ulcer: Secondary | ICD-10-CM | POA: Diagnosis not present

## 2023-07-16 DIAGNOSIS — I872 Venous insufficiency (chronic) (peripheral): Secondary | ICD-10-CM | POA: Diagnosis not present

## 2023-07-17 ENCOUNTER — Encounter: Payer: Self-pay | Admitting: Ophthalmology

## 2023-07-17 ENCOUNTER — Ambulatory Visit: Payer: Medicare Other | Admitting: Family Medicine

## 2023-07-17 VITALS — BP 132/80 | HR 64 | Temp 97.8°F | Resp 18 | Ht 61.0 in | Wt 248.4 lb

## 2023-07-17 DIAGNOSIS — E7849 Other hyperlipidemia: Secondary | ICD-10-CM | POA: Diagnosis not present

## 2023-07-17 DIAGNOSIS — F341 Dysthymic disorder: Secondary | ICD-10-CM | POA: Diagnosis not present

## 2023-07-17 DIAGNOSIS — Z23 Encounter for immunization: Secondary | ICD-10-CM | POA: Diagnosis not present

## 2023-07-17 DIAGNOSIS — E1159 Type 2 diabetes mellitus with other circulatory complications: Secondary | ICD-10-CM | POA: Diagnosis not present

## 2023-07-17 DIAGNOSIS — R600 Localized edema: Secondary | ICD-10-CM | POA: Diagnosis not present

## 2023-07-17 DIAGNOSIS — E1165 Type 2 diabetes mellitus with hyperglycemia: Secondary | ICD-10-CM | POA: Diagnosis not present

## 2023-07-17 DIAGNOSIS — I2510 Atherosclerotic heart disease of native coronary artery without angina pectoris: Secondary | ICD-10-CM | POA: Diagnosis not present

## 2023-07-17 MED ORDER — VENLAFAXINE HCL ER 37.5 MG PO CP24: 37.5000 mg | ORAL_CAPSULE | Freq: Every day | ORAL | 3 refills | Status: DC

## 2023-07-18 ENCOUNTER — Encounter: Payer: Self-pay | Admitting: Family Medicine

## 2023-07-18 LAB — LIPID PANEL
Cholesterol: 163 mg/dL (ref 0–200)
HDL: 34.5 mg/dL — ABNORMAL LOW (ref >39.00)
LDL Cholesterol: 64 mg/dL (ref 0–99)
NonHDL: 128.48
Total CHOL/HDL Ratio: 5
Triglycerides: 324 mg/dL — ABNORMAL HIGH (ref 0.0–149.0)
VLDL: 64.8 mg/dL — ABNORMAL HIGH (ref 0.0–40.0)

## 2023-07-18 LAB — BASIC METABOLIC PANEL
BUN: 15 mg/dL (ref 6–23)
CO2: 30 meq/L (ref 19–32)
Calcium: 9.1 mg/dL (ref 8.4–10.5)
Chloride: 103 meq/L (ref 96–112)
Creatinine, Ser: 0.67 mg/dL (ref 0.40–1.20)
GFR: 83.71 mL/min (ref >60.00)
Glucose, Bld: 117 mg/dL — ABNORMAL HIGH (ref 70–99)
Potassium: 4.2 meq/L (ref 3.5–5.1)
Sodium: 141 meq/L (ref 135–145)

## 2023-07-18 LAB — CBC
HCT: 39.7 % (ref 36.0–46.0)
Hemoglobin: 13 g/dL (ref 12.0–15.0)
MCHC: 32.7 g/dL (ref 30.0–36.0)
MCV: 90.6 fL (ref 78.0–100.0)
Platelets: 224 10*3/uL (ref 150.0–400.0)
RBC: 4.38 Mil/uL (ref 3.87–5.11)
RDW: 15.3 % (ref 11.5–15.5)
WBC: 6.6 10*3/uL (ref 4.0–10.5)

## 2023-07-18 LAB — HEMOGLOBIN A1C: Hgb A1c MFr Bld: 6.9 % — ABNORMAL HIGH (ref 4.6–6.5)

## 2023-08-09 ENCOUNTER — Other Ambulatory Visit: Payer: Self-pay | Admitting: Family Medicine

## 2023-08-09 DIAGNOSIS — F341 Dysthymic disorder: Secondary | ICD-10-CM

## 2023-08-13 DIAGNOSIS — H25811 Combined forms of age-related cataract, right eye: Secondary | ICD-10-CM | POA: Diagnosis not present

## 2023-08-15 DIAGNOSIS — I872 Venous insufficiency (chronic) (peripheral): Secondary | ICD-10-CM | POA: Diagnosis not present

## 2023-08-15 DIAGNOSIS — E119 Type 2 diabetes mellitus without complications: Secondary | ICD-10-CM | POA: Diagnosis not present

## 2023-08-15 DIAGNOSIS — I890 Lymphedema, not elsewhere classified: Secondary | ICD-10-CM | POA: Diagnosis not present

## 2023-08-19 DIAGNOSIS — I890 Lymphedema, not elsewhere classified: Secondary | ICD-10-CM | POA: Diagnosis not present

## 2023-08-19 DIAGNOSIS — I872 Venous insufficiency (chronic) (peripheral): Secondary | ICD-10-CM | POA: Diagnosis not present

## 2023-08-19 DIAGNOSIS — E119 Type 2 diabetes mellitus without complications: Secondary | ICD-10-CM | POA: Diagnosis not present

## 2023-08-20 NOTE — Progress Notes (Signed)
Cardiology Office Note    Date:  08/27/2023   ID:  Debra Knight, DOB 10-19-1945, MRN [id number]  PCP:  Pearline Cables, MD  Cardiologist: Dr. Eden Emms / Dr. Excell Seltzer (TAVR)  CC: TAVR/ CAD   History of Present Illness:  Debra Knight is a 77 y.o. female with a history of CAD, HLD, obesity, DJD and severe aortic stenosis s/p TAVR (04/23/17) with Medtronic 29 mm CoreValve Evolut Pro. This was done with general anesthesia and right TF approach. There was no PVL Cath prior to valve did show severe proximal circumflex disease 80% This has been Rx medically. She was thought to be a poor Open surgical candidate due to need for aortic root enlargement poor functional status . She has not had angina She had a normal Non ischemic myovue 08/01/17  Last TTE reviewed 07/09/19 no PVL mean gradient 8 mmHg stable    She was very upset about her DM. Dr Drue Novel started her on glucophage and it has mad her sick " he did not tell me about side effects" Seeing Dr Debra Knight Unable to afford [place] Pepper / Marcelline Deist Trying lower dose ER Metformin  but also GI side effects Currently on no meds   Husband Debra Knight also a patient of mine had repeat cervical surgery at [facility] with some improvement in UE weakness eventually found to have amyloid   TTE 09/29/20 reviewed trivial  PVL normal EF mean gradient 7 mmHg  Myovue 09/29/20 normal no ischemia EF 80%  Thinking of moving to [place] to be with grand kids but having problems selling house   She has lymph pumps now and legs better Only needing 20 mg lasix daily    Past Medical History:  Diagnosis Date   Aortic stenosis, severe    a. 04/2017: s/p TAVR wtih a Medtronic Evolut-Pro  (size 29 mm, model # Evolutpro29US, serial # [id number])   CAD (coronary artery disease)    a. pre-TAVR cath 01/2017 showed 80% LCX stenosis. No angina. Plan to treat medically   Enlarged thyroid    GERD (gastroesophageal reflux disease)    Hyperlipidemia    OSA (obstructive sleep apnea)    no cpap     Osteoarthritis of both knees    Tinnitus     Past Surgical History:  Procedure Laterality Date   ANAL RECTAL MANOMETRY N/A 05/03/2021   Procedure: ANO RECTAL MANOMETRY;  Surgeon: Napoleon Form, MD;  Location: [facility];  Service: Endoscopy;  Laterality: N/A;   BIOPSY THYROID  2015   DILATION AND CURETTAGE OF UTERUS     LEFT HEART CATH AND CORONARY ANGIOGRAPHY N/A 01/15/2017   Procedure: Left Heart Cath and Coronary Angiography;  Surgeon: Tonny Bollman, MD;  Location: [facility] INVASIVE CV LAB;  Service: Cardiovascular;  Laterality: N/A;   TEE WITHOUT CARDIOVERSION N/A 04/23/2017   Procedure: TRANSESOPHAGEAL ECHOCARDIOGRAM (TEE);  Surgeon: Tonny Bollman, MD;  Location: [facility] OR;  Service: Open Heart Surgery;  Laterality: N/A;   TONSILLECTOMY  age 66   TOTAL KNEE ARTHROPLASTY Right 12/13/2014   Procedure: RIGHT TOTAL KNEE ARTHROPLASTY;  Surgeon: Ollen Gross, MD;  Location: [facility];  Service: Orthopedics;  Laterality: Right;   TOTAL KNEE ARTHROPLASTY Left 02/20/2016   Procedure: LEFT TOTAL KNEE ARTHROPLASTY;  Surgeon: Ollen Gross, MD;  Location: [facility];  Service: Orthopedics;  Laterality: Left;   TRANSCATHETER AORTIC VALVE REPLACEMENT, TRANSFEMORAL N/A 04/23/2017   Procedure: TRANSCATHETER AORTIC VALVE REPLACEMENT, TRANSFEMORAL;  Surgeon: Tonny Bollman, MD;  Location: [facility] OR;  Service: Open Heart  Surgery;  Laterality: N/A;  MEDTRONIC VALVE    Current Medications: Outpatient Medications Prior to Visit  Medication Sig Dispense Refill   aspirin EC 81 MG tablet Take 81 mg by mouth every evening.     atorvastatin (LIPITOR) 80 MG tablet TAKE 1 TABLET(80 MG) BY MOUTH AT BEDTIME. 90 tablet 3   Blood Glucose Monitoring Suppl (ACCU-CHEK GUIDE) w/Device KIT 1 kit by Does not apply route See admin instructions. Use to check blood sugar 3 times a day 1 kit 0   clindamycin (CLEOCIN) 300 MG capsule Take 2 capsules (600 mg total) by mouth as directed. Take 2 tablets by mouth 60 minutes prior to dental  appointments 2 capsule 3   furosemide (LASIX) 20 MG tablet Take 1 tablet (20 mg total) by mouth daily. 30 tablet 3   glucose blood (ACCU-CHEK GUIDE) test strip Use as instructed to check blood sugar once a day 100 each 3   metoprolol tartrate (LOPRESSOR) 25 MG tablet TAKE 1 TABLET (25 MG TOTAL) BY MOUTH 2 (TWO) TIMES DAILY. KEEP APPOINTMENT FOR FUTURE REFILLS. 180 tablet 3   Multiple Vitamin (MULTIVITAMIN WITH MINERALS) TABS tablet Take 1 tablet by mouth daily.     nitroGLYCERIN (NITROSTAT) 0.4 MG SL tablet Place 1 tablet (0.4 mg total) under the tongue every 5 (five) minutes as needed for chest pain. 25 tablet 3   ONE TOUCH LANCETS MISC Check blood sugar three times daily 300 each 12   sulfamethoxazole-trimethoprim (BACTRIM DS) 800-160 MG tablet Take 1 tablet by mouth 2 (two) times daily. 14 tablet 0   triamcinolone cream (KENALOG) 0.1 % Apply 1 Application topically 2 (two) times daily. 30 g 0   venlafaxine XR (EFFEXOR-XR) 37.5 MG 24 hr capsule TAKE 1 CAPSULE BY MOUTH DAILY WITH BREAKFAST. 90 capsule 2   No facility-administered medications prior to visit.     Allergies:   Advil [ibuprofen], Penicillins, Demerol [meperidine], Other, and Cortisone   Social History   Socioeconomic History   Marital status: Married    Spouse name: Not on file   Number of children: 2   Years of education: Not on file   Highest education level: Bachelor's degree (e.g., BA, AB, BS)  Occupational History   Occupation: self employed  Tobacco Use   Smoking status: Former    Current packs/day: 0.00    Average packs/day: 2.0 packs/day for 15.0 years (30.0 ttl pk-yrs)    Types: Cigarettes    Start date: 09/03/1953    Quit date: 09/03/1968    Years since quitting: 55.0   Smokeless tobacco: Never   Tobacco comments:    1-2 ppd. , NONE IN 40 YEARS  Vaping Use   Vaping status: Never Used  Substance and Sexual Activity   Alcohol use: No   Drug use: No   Sexual activity: Not on file  Other Topics Concern    Not on file  Social History Narrative   Household: pt and husband    Social Drivers of Corporate investment banker Strain: Patient Declined (07/11/2023)   Overall Financial Resource Strain (CARDIA)    Difficulty of Paying Living Expenses: Patient declined  Food Insecurity: No Food Insecurity (07/11/2023)   Hunger Vital Sign    Worried About Running Out of Food in the Last Year: Never true    Ran Out of Food in the Last Year: Never true  Transportation Needs: No Transportation Needs (07/11/2023)   PRAPARE - Administrator, Civil Service (Medical): No  Lack of Transportation (Non-Medical): No  Physical Activity: Inactive (07/11/2023)   Exercise Vital Sign    Days of Exercise per Week: 0 days    Minutes of Exercise per Session: 0 min  Stress: Stress Concern Present (07/11/2023)   [employer] of Occupational Health - Occupational Stress Questionnaire    Feeling of Stress : Very much  Social Connections: Unknown (07/11/2023)   Social Connection and Isolation Panel [NHANES]    Frequency of Communication with Friends and Family: More than three times a week    Frequency of Social Gatherings with Friends and Family: Patient declined    Attends Religious Services: Patient declined    Database administrator or Organizations: No    Attends Engineer, structural: Never    Marital Status: Married     Family History:  The patient's family history includes Diabetes in her maternal grandfather and maternal uncle; Hashimoto's thyroiditis in her daughter and son; Heart attack (age of onset: 13) in her mother; Heart disease in her mother; Prostate cancer in her father.      ROS:   Please see the history of present illness.    ROS All other systems reviewed and are negative.   PHYSICAL EXAM:   VS:  There were no vitals taken for this visit.   Affect appropriate Obese white female  HEENT: normal Neck supple with no adenopathy JVP normal no bruits no thyromegaly Lungs  clear with no wheezing and good diaphragmatic motion Heart:  S1/S2 SEM  murmur, no rub, gallop or click PMI normal Abdomen: benighn, BS positve, no tenderness, no AAA no bruit.  No HSM or HJR Distal pulses intact with no bruits post RFA cut down for TAVR  Plus 2 bilateral edema Neuro non-focal Skin warm and dry Post bilateral TKR     Wt Readings from Last 3 Encounters:  07/17/23 248 lb 6.4 oz (112.7 kg)  05/30/23 248 lb (112.5 kg)  05/14/23 248 lb (112.5 kg)      Studies/Labs Reviewed:   EKG:  08/27/2023 SR rate  62 PR 228 otherwise normal   Recent Labs: 05/14/2023: ALT 20; Pro B Natriuretic peptide (BNP) 64.0 07/17/2023: BUN 15; Creatinine, Ser 0.67; Hemoglobin 13.0; Platelets 224.0; Potassium 4.2; Sodium 141   Lipid Panel    Component Value Date/Time   CHOL 163 07/17/2023 1402   TRIG 324.0 (H) 07/17/2023 1402   HDL 34.50 (L) 07/17/2023 1402   CHOLHDL 5 07/17/2023 1402   VLDL 64.8 (H) 07/17/2023 1402   LDLCALC 64 07/17/2023 1402   LDLDIRECT 89.0 04/02/2022 1442    Additional studies/ records that were reviewed today include:  04/23/17 Procedure:      Transcatheter Aortic Valve Replacement - Percutaneous Right Transfemoral Approach             Medtronic Evolut-Pro  (size 29 mm, model # Evolutpro29US, serial # [id number])   Co-Surgeons:            Alleen Borne, MD and Tonny Bollman, MD     Anesthesiologist:                  Sharee Holster, MD   Echocardiographer:              Tobias Alexander, MD   Pre-operative Echo Findings: Severe aortic stenosis and moderate AI  Normal left ventricular systolic function   Post-operative Echo Findings: trivial paravalvular leak Normal left ventricular systolic function Normal prosthetic transvalvular gradient of 8 mm Hg.   _____________  Echo  07/10/23  IMPRESSIONS     1. Left ventricular ejection fraction, by estimation, is 60 to 65%. The  left ventricle has normal function. The left ventricle has no regional   wall motion abnormalities. There is mild asymmetric left ventricular  hypertrophy of the basal and septal  segments. Left ventricular diastolic parameters are indeterminate.   2. Right ventricular systolic function is normal. The right ventricular  size is normal.   3. Left atrial size was severely dilated.   4. Mean gradient across the valve in diastole 8 mmHg at HR 64 bpm with  MVA 2.1 cm2 . The mitral valve is abnormal. No evidence of mitral valve  regurgitation. No evidence of mitral stenosis. Severe mitral annular  calcification.   5. Post TAVR with supra annular 29 mm Medtronic Core Valve mean gradient  9 peak 16.6 mmHg with stable trivial PVL AVA 2.9 cm2 . The aortic valve  has been repaired/replaced. Aortic valve regurgitation is not visualized.  No aortic stenosis is present.  There is a 29 mm Medtronic CoreValve-Evolut Pro prosthetic (TAVR) valve  present in the aortic position. Procedure Date: 04/23/2017.   6. The inferior vena cava is normal in size with greater than 50%  respiratory variability, suggesting right atrial pressure of 3 mmHg    ASSESSMENT & PLAN:   Severe AS s/p TAVR:  NHYA class 1. Post Evolut Pro 29 mm Medtronic CoreValve stable gradients mean9 peak 16 mmHg AVA 2.9 cm2 no PVL by TTE 07/10/23 She does also have severe MAC and mild functional MS but stable   HTN: Well controlled.  Continue current medications and low sodium Dash type diet.    HLD: continue statin   CAD: continue asa, statin and BB circumflex lesion is very tight no angina and normal myovue  09/29/20 consider PET/CT next year No angina   LBBB: resolved now only with mild first degree AV block   DM:  Intolerant to mulitple meds including glucophage, SGLT2 with some cost prohibitive Seen by endocrine [facility] and A1c 6.6 so being Rx with diet only   Lymphedema:  continue lasix and lymph pumps Low sodium diet improved   F/U in 6 months   Charlton Haws

## 2023-08-22 DIAGNOSIS — I872 Venous insufficiency (chronic) (peripheral): Secondary | ICD-10-CM | POA: Diagnosis not present

## 2023-08-22 DIAGNOSIS — E119 Type 2 diabetes mellitus without complications: Secondary | ICD-10-CM | POA: Diagnosis not present

## 2023-08-22 DIAGNOSIS — I890 Lymphedema, not elsewhere classified: Secondary | ICD-10-CM | POA: Diagnosis not present

## 2023-08-27 ENCOUNTER — Ambulatory Visit: Payer: Medicare Other | Attending: Cardiovascular Disease | Admitting: Cardiovascular Disease

## 2023-08-27 ENCOUNTER — Encounter: Payer: Self-pay | Admitting: Cardiovascular Disease

## 2023-08-27 VITALS — BP 124/60 | HR 56 | Ht 60.0 in | Wt 239.0 lb

## 2023-08-27 DIAGNOSIS — I10 Essential (primary) hypertension: Secondary | ICD-10-CM | POA: Diagnosis not present

## 2023-08-27 DIAGNOSIS — Z952 Presence of prosthetic heart valve: Secondary | ICD-10-CM | POA: Diagnosis not present

## 2023-08-27 DIAGNOSIS — I447 Left bundle-branch block, unspecified: Secondary | ICD-10-CM | POA: Diagnosis not present

## 2023-08-27 DIAGNOSIS — E782 Mixed hyperlipidemia: Secondary | ICD-10-CM | POA: Diagnosis not present

## 2023-08-27 NOTE — Patient Instructions (Signed)
Medication Instructions:  Your physician recommends that you continue on your current medications as directed. Please refer to the Current Medication list given to you today.  *If you need a refill on your cardiac medications before your next appointment, please call your pharmacy*  Lab Work: If you have labs (blood work) drawn today and your tests are completely normal, you will receive your results only by: MyChart Message (if you have MyChart) OR A paper copy in the mail If you have any lab test that is abnormal or we need to change your treatment, we will call you to review the results.  Testing/Procedures: None ordered today.  Follow-Up: At [facility], you and your health needs are our priority.  As part of our continuing mission to provide you with exceptional heart care, we have created designated Provider Care Teams.  These Care Teams include your primary Cardiologist (physician) and Advanced Practice Providers (APPs -  Physician Assistants and Nurse Practitioners) who all work together to provide you with the care you need, when you need it.  We recommend signing up for the patient portal called "MyChart".  Sign up information is provided on this After Visit Summary.  MyChart is used to connect with patients for Virtual Visits (Telemedicine).  Patients are able to view lab/test results, encounter notes, upcoming appointments, etc.  Non-urgent messages can be sent to your provider as well.   To learn more about what you can do with MyChart, go to [email or web address].    Your next appointment:   6 month(s)  Provider:   Peter Nishan, MD      

## 2023-09-10 DIAGNOSIS — H25811 Combined forms of age-related cataract, right eye: Secondary | ICD-10-CM | POA: Diagnosis not present

## 2023-09-10 DIAGNOSIS — H268 Other specified cataract: Secondary | ICD-10-CM | POA: Diagnosis not present

## 2023-09-18 DIAGNOSIS — H25812 Combined forms of age-related cataract, left eye: Secondary | ICD-10-CM | POA: Diagnosis not present

## 2023-09-22 ENCOUNTER — Other Ambulatory Visit: Payer: Self-pay | Admitting: Family Medicine

## 2023-09-22 DIAGNOSIS — R600 Localized edema: Secondary | ICD-10-CM

## 2023-10-08 DIAGNOSIS — H25812 Combined forms of age-related cataract, left eye: Secondary | ICD-10-CM | POA: Diagnosis not present

## 2023-10-08 DIAGNOSIS — H268 Other specified cataract: Secondary | ICD-10-CM | POA: Diagnosis not present

## 2023-10-29 ENCOUNTER — Ambulatory Visit (INDEPENDENT_AMBULATORY_CARE_PROVIDER_SITE_OTHER): Payer: Medicare Other

## 2023-10-29 VITALS — Ht 61.5 in | Wt 220.0 lb

## 2023-10-29 DIAGNOSIS — Z0000 Encounter for general adult medical examination without abnormal findings: Secondary | ICD-10-CM

## 2023-10-29 NOTE — Patient Instructions (Addendum)
 Ms. Wyffels , Thank you for taking time to come for your Medicare Wellness Visit. I appreciate your ongoing commitment to your health goals. Please review the following plan we discussed and let me know if I can assist you in the future.   Referrals/Orders/Follow-Ups/Clinician Recommendations:   This is a list of the screening recommended for you and due dates:  Health Maintenance  Topic Date Due   Hepatitis C Screening  Never done   Zoster (Shingles) Vaccine (1 of 2) 02/13/1995   DEXA scan (bone density measurement)  Never done   Yearly kidney health urinalysis for diabetes  10/15/2020   Complete foot exam   04/04/2023   COVID-19 Vaccine (4 - 2024-25 season) 05/05/2023   Hemoglobin A1C  01/14/2024   Eye exam for diabetics  07/03/2024   Yearly kidney function blood test for diabetes  07/16/2024   Medicare Annual Wellness Visit  10/28/2024   DTaP/Tdap/Td vaccine (2 - Tdap) 06/03/2028   Pneumonia Vaccine  Completed   Flu Shot  Completed   HPV Vaccine  Aged Out   Colon Cancer Screening  Discontinued    Advanced directives: (Copy Requested) Please bring a copy of your health care power of attorney and living will to the office to be added to your chart at your convenience.  Next Medicare Annual Wellness Visit scheduled for next year: Yes

## 2023-10-29 NOTE — Progress Notes (Signed)
 Subjective:   Debra Knight is a 78 y.o. female who presents for Medicare Annual (Subsequent) preventive examination.  Visit Complete: Virtual I connected with  Debra Knight on 10/29/23 by a audio enabled telemedicine application and verified that I am speaking with the correct person using two identifiers.  Patient Location: Home  Provider Location: Home Office  I discussed the limitations of evaluation and management by telemedicine. The patient expressed understanding and agreed to proceed.  Vital Signs: Because this visit was a virtual/telehealth visit, some criteria may be missing or patient reported. Any vitals not documented were not able to be obtained and vitals that have been documented are patient reported.   Cardiac Risk Factors include: advanced age (>49men, >41 women);diabetes mellitus;hypertension     Objective:    Today's Vitals   10/29/23 0934  Weight: 220 lb (99.8 kg)  Height: 5' 1.5" (1.562 m)   Body mass index is 40.9 kg/m.     10/29/2023    9:42 AM 10/16/2022    9:02 AM 04/23/2017    6:00 PM 04/18/2017    2:23 PM 03/19/2017    3:14 PM 01/15/2017    1:31 PM 02/20/2016    2:35 PM  Advanced Directives  Does Patient Have a Medical Advance Directive? Yes Yes No No No Yes No  Type of Estate agent of [place];Living will Healthcare Power of [place];Living will       Copy of Healthcare Power of Attorney in Chart? No - copy requested No - copy requested       Would patient like information on creating a medical advance directive?   No - Patient declined No - Patient declined   No - patient declined information    Current Medications (verified) Outpatient Encounter Medications as of 10/29/2023  Medication Sig   aspirin EC 81 MG tablet Take 81 mg by mouth every evening.   atorvastatin (LIPITOR) 80 MG tablet TAKE 1 TABLET(80 MG) BY MOUTH AT BEDTIME.   Blood Glucose Monitoring Suppl (ACCU-CHEK GUIDE) w/Device KIT 1 kit by Does not apply  route See admin instructions. Use to check blood sugar 3 times a day   clindamycin (CLEOCIN) 300 MG capsule Take 2 capsules (600 mg total) by mouth as directed. Take 2 tablets by mouth 60 minutes prior to dental appointments   furosemide (LASIX) 20 MG tablet TAKE 1 TABLET BY MOUTH EVERY DAY   glucose blood (ACCU-CHEK GUIDE) test strip Use as instructed to check blood sugar once a day   metoprolol tartrate (LOPRESSOR) 25 MG tablet TAKE 1 TABLET (25 MG TOTAL) BY MOUTH 2 (TWO) TIMES DAILY. KEEP APPOINTMENT FOR FUTURE REFILLS.   Multiple Vitamin (MULTIVITAMIN WITH MINERALS) TABS tablet Take 1 tablet by mouth daily.   nitroGLYCERIN (NITROSTAT) 0.4 MG SL tablet Place 1 tablet (0.4 mg total) under the tongue every 5 (five) minutes as needed for chest pain.   ONE TOUCH LANCETS MISC Check blood sugar three times daily   venlafaxine XR (EFFEXOR-XR) 37.5 MG 24 hr capsule TAKE 1 CAPSULE BY MOUTH DAILY WITH BREAKFAST.   No facility-administered encounter medications on file as of 10/29/2023.    Allergies (verified) Advil [ibuprofen], Penicillins, Demerol [meperidine], Other, and Cortisone   History: Past Medical History:  Diagnosis Date   Aortic stenosis, severe    a. 04/2017: s/p TAVR wtih a Medtronic Evolut-Pro  (size 29 mm, model # Evolutpro29US, serial # [id number])   CAD (coronary artery disease)    a. pre-TAVR cath 01/2017 showed  80% LCX stenosis. No angina. Plan to treat medically   Enlarged thyroid    GERD (gastroesophageal reflux disease)    Hyperlipidemia    OSA (obstructive sleep apnea)    no cpap    Osteoarthritis of both knees    Tinnitus    Past Surgical History:  Procedure Laterality Date   ANAL RECTAL MANOMETRY N/A 05/03/2021   Procedure: ANO RECTAL MANOMETRY;  Surgeon: Napoleon Form, MD;  Location: [facility] ENDOSCOPY;  Service: Endoscopy;  Laterality: N/A;   BIOPSY THYROID  2015   DILATION AND CURETTAGE OF UTERUS     LEFT HEART CATH AND CORONARY ANGIOGRAPHY N/A 01/15/2017    Procedure: Left Heart Cath and Coronary Angiography;  Surgeon: Tonny Bollman, MD;  Location: [facility] INVASIVE CV LAB;  Service: Cardiovascular;  Laterality: N/A;   TEE WITHOUT CARDIOVERSION N/A 04/23/2017   Procedure: TRANSESOPHAGEAL ECHOCARDIOGRAM (TEE);  Surgeon: Tonny Bollman, MD;  Location: [facility] OR;  Service: Open Heart Surgery;  Laterality: N/A;   TONSILLECTOMY  age 54   TOTAL KNEE ARTHROPLASTY Right 12/13/2014   Procedure: RIGHT TOTAL KNEE ARTHROPLASTY;  Surgeon: Ollen Gross, MD;  Location: [facility] ORS;  Service: Orthopedics;  Laterality: Right;   TOTAL KNEE ARTHROPLASTY Left 02/20/2016   Procedure: LEFT TOTAL KNEE ARTHROPLASTY;  Surgeon: Ollen Gross, MD;  Location: [facility] ORS;  Service: Orthopedics;  Laterality: Left;   TRANSCATHETER AORTIC VALVE REPLACEMENT, TRANSFEMORAL N/A 04/23/2017   Procedure: TRANSCATHETER AORTIC VALVE REPLACEMENT, TRANSFEMORAL;  Surgeon: Tonny Bollman, MD;  Location[facility] OR;  Service: Open Heart Surgery;  Laterality: N/A;  MEDTRONIC VALVE   Family History  Problem Relation Age of Onset   Heart disease Mother    Heart attack Mother 92   Prostate cancer Father    Diabetes Maternal Grandfather    Diabetes Maternal Uncle    Hashimoto's thyroiditis Daughter    Hashimoto's thyroiditis Son    Breast cancer Neg Hx    Social History   Socioeconomic History   Marital status: Married    Spouse name: Not on file   Number of children: 2   Years of education: Not on file   Highest education level: Bachelor's degree (e.g., BA, AB, BS)  Occupational History   Occupation: self employed  Tobacco Use   Smoking status: Former    Current packs/day: 0.00    Average packs/day: 2.0 packs/day for 15.0 years (30.0 ttl pk-yrs)    Types: Cigarettes    Start date: 09/03/1953    Quit date: 09/03/1968    Years since quitting: 55.1   Smokeless tobacco: Never   Tobacco comments:    1-2 ppd. , NONE IN 40 YEARS  Vaping Use   Vaping status: Never Used  Substance and Sexual Activity    Alcohol use: No   Drug use: No   Sexual activity: Not on file  Other Topics Concern   Not on file  Social History Narrative   Household: pt and husband    Social Drivers of Corporate investment banker Strain: Low Risk  (10/29/2023)   Overall Financial Resource Strain (CARDIA)    Difficulty of Paying Living Expenses: Not hard at all  Food Insecurity: No Food Insecurity (10/29/2023)   Hunger Vital Sign    Worried About Running Out of Food in the Last Year: Never true    Ran Out of Food in the Last Year: Never true  Transportation Needs: No Transportation Needs (10/29/2023)   PRAPARE - Administrator, Civil Service (Medical): No  Lack of Transportation (Non-Medical): No  Physical Activity: Inactive (10/29/2023)   Exercise Vital Sign    Days of Exercise per Week: 0 days    Minutes of Exercise per Session: 0 min  Stress: No Stress Concern Present (10/29/2023)   [employer] of Occupational Health - Occupational Stress Questionnaire    Feeling of Stress : Not at all  Social Connections: Moderately Isolated (10/29/2023)   Social Connection and Isolation Panel [NHANES]    Frequency of Communication with Friends and Family: More than three times a week    Frequency of Social Gatherings with Friends and Family: More than three times a week    Attends Religious Services: Never    Database administrator or Organizations: No    Attends Engineer, structural: Never    Marital Status: Married    Tobacco Counseling Counseling given: Not Answered Tobacco comments: 1-2 ppd. , NONE IN 40 YEARS   Clinical Intake:  Pre-visit preparation completed: Yes  Pain : No/denies pain     BMI - recorded: 40.9 Nutritional Status: BMI > 30  Obese Nutritional Risks: None Diabetes: Yes CBG done?: No Did pt. bring in CBG monitor from home?: No  How often do you need to have someone help you when you read instructions, pamphlets, or other written materials from your doctor or  pharmacy?: 1 - Never  Interpreter Needed?: No  Information entered by :: Theresa Mulligan LPN   Activities of Daily Living    10/29/2023    9:40 AM  In your present state of health, do you have any difficulty performing the following activities:  Hearing? 0  Vision? 0  Difficulty concentrating or making decisions? 0  Walking or climbing stairs? 0  Dressing or bathing? 0  Doing errands, shopping? 0  Preparing Food and eating ? N  Using the Toilet? N  In the past six months, have you accidently leaked urine? N  Do you have problems with loss of bowel control? N  Managing your Medications? N  Managing your Finances? N  Housekeeping or managing your Housekeeping? N    Patient Care Team: Copland, Gwenlyn Found, MD as PCP - General (Family Medicine) Wendall Stade, MD as PCP - Cardiology (Cardiology) Oretha Milch, MD (Pulmonary Disease) Tedd Sias, MD as Referring Physician (Optometry)  Indicate any recent Medical Services you may have received from other than [facility] providers in the past year (date may be approximate).     Assessment:   This is a routine wellness examination for Debra Knight.  Hearing/Vision screen Hearing Screening - Comments:: Denies hearing difficulties   Vision Screening - Comments:: Wears rx glasses - up to date with routine eye exams with  Dr Shari Prows   Goals Addressed               This Visit's Progress     Increase physical activity (pt-stated)        Lose Weight       Depression Screen    10/29/2023    9:39 AM 07/17/2023    1:48 PM 10/16/2022    9:06 AM 04/02/2022    1:52 PM 06/03/2018    2:12 PM  PHQ 2/9 Scores  PHQ - 2 Score 0 0 0 0 0  PHQ- 9 Score  0       Fall Risk    10/29/2023    9:40 AM 07/17/2023    1:48 PM 10/16/2022    9:03 AM 04/02/2022    1:51  PM 06/03/2018    2:12 PM  Fall Risk   Falls in the past year? 1 0 0 0 No  Number falls in past yr: 0 0 0 0   Injury with Fall? 0 0 0 0   Risk for fall due to : No Fall Risks  No Fall Risks No Fall Risks    Follow up Falls prevention discussed;Falls evaluation completed Falls evaluation completed Falls evaluation completed      MEDICARE RISK AT HOME: Medicare Risk at Home Any stairs in or around the home?: Yes If so, are there any without handrails?: No Home free of loose throw rugs in walkways, pet beds, electrical cords, etc?: Yes Adequate lighting in your home to reduce risk of falls?: Yes Life alert?: No Use of a cane, walker or w/c?: No Grab bars in the bathroom?: No Shower chair or bench in shower?: No Elevated toilet seat or a handicapped toilet?: No  TIMED UP AND GO:  Was the test performed?  No    Cognitive Function:    10/16/2022    9:26 AM  MMSE - Mini Mental State Exam  Not completed: Unable to complete        10/29/2023    9:42 AM  6CIT Screen  What Year? 0 points  What month? 0 points  What time? 0 points  Count back from 20 0 points  Months in reverse 0 points  Repeat phrase 0 points  Total Score 0 points    Immunizations Immunization History  Administered Date(s) Administered   Fluad Quad(high Dose 65+) 06/22/2022   Fluad Trivalent(High Dose 65+) 07/17/2023   Influenza Split 07/23/2013   Influenza-Unspecified 05/04/2018   PFIZER Comirnaty(Gray Top)Covid-19 Tri-Sucrose Vaccine 09/24/2019, 10/15/2019, 06/22/2020   Pneumococcal Conjugate-13 10/19/2016   Pneumococcal Polysaccharide-23 05/04/2018   Td 06/03/2018   Zoster, Unspecified 06/04/2023    TDAP status: Up to date  Flu Vaccine status: Up to date  Pneumococcal vaccine status: Up to date  Covid-19 vaccine status: Declined, Education has been provided regarding the importance of this vaccine but patient still declined. Advised may receive this vaccine at local pharmacy or Health Dept.or vaccine clinic. Aware to provide a copy of the vaccination record if obtained from local pharmacy or Health Dept. Verbalized acceptance and understanding.  Qualifies for Shingles  Vaccine? Yes   Zostavax completed Yes   Shingrix Completed?: Yes  Screening Tests Health Maintenance  Topic Date Due   Hepatitis C Screening  Never done   Zoster Vaccines- Shingrix (1 of 2) 02/13/1995   DEXA SCAN  Never done   Diabetic kidney evaluation - Urine ACR  10/15/2020   FOOT EXAM  04/04/2023   COVID-19 Vaccine (4 - 2024-25 season) 05/05/2023   HEMOGLOBIN A1C  01/14/2024   OPHTHALMOLOGY EXAM  07/03/2024   Diabetic kidney evaluation - eGFR measurement  07/16/2024   Medicare Annual Wellness (AWV)  10/28/2024   DTaP/Tdap/Td (2 - Tdap) 06/03/2028   Pneumonia Vaccine 9+ Years old  Completed   INFLUENZA VACCINE  Completed   HPV VACCINES  Aged Out   Colonoscopy  Discontinued    Health Maintenance  Health Maintenance Due  Topic Date Due   Hepatitis C Screening  Never done   Zoster Vaccines- Shingrix (1 of 2) 02/13/1995   DEXA SCAN  Never done   Diabetic kidney evaluation - Urine ACR  10/15/2020   FOOT EXAM  04/04/2023   COVID-19 Vaccine (4 - 2024-25 season) 05/05/2023        Bone  Density status: Ordered Patient deferred. Pt provided with contact info and advised to call to schedule appt.     Additional Screening:  Hepatitis C Screening: does qualify;  Deferred  Vision Screening: Recommended annual ophthalmology exams for early detection of glaucoma and other disorders of the eye. Is the patient up to date with their annual eye exam?  Yes  Who is the provider or what is the name of the office in which the patient attends annual eye exams? Dr Shari Prows If pt is not established with a provider, would they like to be referred to a provider to establish care? No .   Dental Screening: Recommended annual dental exams for proper oral hygiene  Diabetic Foot Exam: Diabetic Foot Exam: Overdue, Pt has been advised about the importance in completing this exam. Pt is scheduled for diabetic foot exam on Deferred.  Community Resource Referral / Chronic Care  Management:  CRR required this visit?  No   CCM required this visit?  No     Plan:     I have personally reviewed and noted the following in the patient's chart:   Medical and social history Use of alcohol, tobacco or illicit drugs  Current medications and supplements including opioid prescriptions. Patient is not currently taking opioid prescriptions. Functional ability and status Nutritional status Physical activity Advanced directives List of other physicians Hospitalizations, surgeries, and ER visits in previous 12 months Vitals Screenings to include cognitive, depression, and falls Referrals and appointments  In addition, I have reviewed and discussed with patient certain preventive protocols, quality metrics, and best practice recommendations. A written personalized care plan for preventive services as well as general preventive health recommendations were provided to patient.     Tillie Rung, LPN   04/13/9146   After Visit Summary: (MyChart) Due to this being a telephonic visit, the after visit summary with patients personalized plan was offered to patient via MyChart   Nurse Notes: None

## 2024-01-22 DIAGNOSIS — D3131 Benign neoplasm of right choroid: Secondary | ICD-10-CM | POA: Diagnosis not present

## 2024-01-22 DIAGNOSIS — H35363 Drusen (degenerative) of macula, bilateral: Secondary | ICD-10-CM | POA: Diagnosis not present

## 2024-01-22 DIAGNOSIS — H11152 Pinguecula, left eye: Secondary | ICD-10-CM | POA: Diagnosis not present

## 2024-01-22 DIAGNOSIS — H353131 Nonexudative age-related macular degeneration, bilateral, early dry stage: Secondary | ICD-10-CM | POA: Diagnosis not present

## 2024-01-22 DIAGNOSIS — H35372 Puckering of macula, left eye: Secondary | ICD-10-CM | POA: Diagnosis not present

## 2024-02-04 NOTE — Progress Notes (Signed)
 Cardiology Office Note    Date:  02/14/2024   ID:  Debra Knight, DOB 07-Jan-1946, MRN [id number]  PCP:  Kaylee Partridge, MD  Cardiologist: Dr. Stann Earnest / Dr. Arlester Ladd (TAVR)  CC: TAVR/ CAD   History of Present Illness:  Debra Knight is a 78 y.o. female with a history of CAD, HLD, obesity, DJD and severe aortic stenosis s/p TAVR (04/23/17) with Medtronic 29 mm CoreValve Evolut Pro. This was done with general anesthesia and right TF approach. There was no PVL Cath prior to valve did show severe proximal circumflex disease 80% This has been Rx medically. She was thought to be a poor Open surgical candidate due to need for aortic root enlargement poor functional status . She has not had angina She had a normal Non ischemic myovue 08/01/17  Last TTE reviewed 07/09/19 no PVL mean gradient 8 mmHg stable    She was very upset about her DM. Dr Neomi Banks started her on glucophage  and it has mad her sick  he did not tell me about side effects Seeing Dr Nolen Baumann Unable to afford Jardiance  / Farxiga  Trying lower dose ER Metformin   but also GI side effects Currently on no meds   Husband Dean Every also a patient of mine had repeat cervical surgery at [facility] with some improvement in UE weakness eventually found to have amyloid   TTE 09/29/20 reviewed trivial  PVL normal EF mean gradient 7 mmHg  Myovue 09/29/20 normal no ischemia EF 80%  Thinking of moving to [place]  to be with grand kids but having problems selling house   She has lymph pumps now and legs better Only needing 20 mg lasix  daily   Sad that they had to put there 105 yo dog down and they missed their daughters wedding in [place]   Past Medical History:  Diagnosis Date   Aortic stenosis, severe    a. 04/2017: s/p TAVR wtih a Medtronic Evolut-Pro  (size 29 mm, model # Evolutpro29US, serial # [id number])   CAD (coronary artery disease)    a. pre-TAVR cath 01/2017 showed 80% LCX stenosis. No angina. Plan to treat medically   Enlarged thyroid     GERD  (gastroesophageal reflux disease)    Hyperlipidemia    OSA (obstructive sleep apnea)    no cpap    Osteoarthritis of both knees    Tinnitus     Past Surgical History:  Procedure Laterality Date   ANAL RECTAL MANOMETRY N/A 05/03/2021   Procedure: ANO RECTAL MANOMETRY;  Surgeon: Nandigam, Kavitha V, MD;  Location: [facility] ENDOSCOPY;  Service: Endoscopy;  Laterality: N/A;   BIOPSY THYROID   2015   DILATION AND CURETTAGE OF UTERUS     LEFT HEART CATH AND CORONARY ANGIOGRAPHY N/A 01/15/2017   Procedure: Left Heart Cath and Coronary Angiography;  Surgeon: Arnoldo Lapping, MD;  Location: [facility] INVASIVE CV LAB;  Service: Cardiovascular;  Laterality: N/A;   TEE WITHOUT CARDIOVERSION N/A 04/23/2017   Procedure: TRANSESOPHAGEAL ECHOCARDIOGRAM (TEE);  Surgeon: Arnoldo Lapping, MD;  Location: [facility] OR;  Service: Open Heart Surgery;  Laterality: N/A;   TONSILLECTOMY  age 33   TOTAL KNEE ARTHROPLASTY Right 12/13/2014   Procedure: RIGHT TOTAL KNEE ARTHROPLASTY;  Surgeon: Liliane Rei, MD;  Location: [facility] ORS;  Service: Orthopedics;  Laterality: Right;   TOTAL KNEE ARTHROPLASTY Left 02/20/2016   Procedure: LEFT TOTAL KNEE ARTHROPLASTY;  Surgeon: Liliane Rei, MD;  Location: [facility] ORS;  Service: Orthopedics;  Laterality: Left;   TRANSCATHETER AORTIC VALVE REPLACEMENT, TRANSFEMORAL N/A 04/23/2017  Procedure: TRANSCATHETER AORTIC VALVE REPLACEMENT, TRANSFEMORAL;  Surgeon: Arnoldo Lapping, MD;  Location: [facility] OR;  Service: Open Heart Surgery;  Laterality: N/A;  MEDTRONIC VALVE    Current Medications: Outpatient Medications Prior to Visit  Medication Sig Dispense Refill   aspirin  EC 81 MG tablet Take 81 mg by mouth every evening.     atorvastatin  (LIPITOR) 80 MG tablet TAKE 1 TABLET(80 MG) BY MOUTH AT BEDTIME. 90 tablet 3   clindamycin  (CLEOCIN ) 300 MG capsule Take 2 capsules (600 mg total) by mouth as directed. Take 2 tablets by mouth 60 minutes prior to dental appointments 2 capsule 3   furosemide  (LASIX ) 20 MG tablet TAKE 1  TABLET BY MOUTH EVERY DAY 90 tablet 1   metoprolol  tartrate (LOPRESSOR ) 25 MG tablet TAKE 1 TABLET (25 MG TOTAL) BY MOUTH 2 (TWO) TIMES DAILY. KEEP APPOINTMENT FOR FUTURE REFILLS. 180 tablet 3   nitroGLYCERIN  (NITROSTAT ) 0.4 MG SL tablet Place 1 tablet (0.4 mg total) under the tongue every 5 (five) minutes as needed for chest pain. 25 tablet 3   venlafaxine  XR (EFFEXOR -XR) 37.5 MG 24 hr capsule TAKE 1 CAPSULE BY MOUTH DAILY WITH BREAKFAST. 90 capsule 2   Multiple Vitamin (MULTIVITAMIN WITH MINERALS) TABS tablet Take 1 tablet by mouth daily.     Blood Glucose Monitoring Suppl (ACCU-CHEK GUIDE) w/Device KIT 1 kit by Does not apply route See admin instructions. Use to check blood sugar 3 times a day 1 kit 0   glucose blood (ACCU-CHEK GUIDE) test strip Use as instructed to check blood sugar once a day 100 each 3   ONE TOUCH LANCETS MISC Check blood sugar three times daily 300 each 12   No facility-administered medications prior to visit.     Allergies:   Advil [ibuprofen], Penicillins, Demerol [meperidine], Other, and Cortisone   Social History   Socioeconomic History   Marital status: Married    Spouse name: Not on file   Number of children: 2   Years of education: Not on file   Highest education level: Bachelor's degree (e.g., BA, AB, BS)  Occupational History   Occupation: self employed  Tobacco Use   Smoking status: Former    Current packs/day: 0.00    Average packs/day: 2.0 packs/day for 15.0 years (30.0 ttl pk-yrs)    Types: Cigarettes    Start date: 09/03/1953    Quit date: 09/03/1968    Years since quitting: 55.4   Smokeless tobacco: Never   Tobacco comments:    1-2 ppd. , NONE IN 40 YEARS  Vaping Use   Vaping status: Never Used  Substance and Sexual Activity   Alcohol use: No   Drug use: No   Sexual activity: Not on file  Other Topics Concern   Not on file  Social History Narrative   Household: pt and husband    Social Drivers of Corporate investment banker Strain:  Low Risk  (10/29/2023)   Overall Financial Resource Strain (CARDIA)    Difficulty of Paying Living Expenses: Not hard at all  Food Insecurity: No Food Insecurity (10/29/2023)   Hunger Vital Sign    Worried About Running Out of Food in the Last Year: Never true    Ran Out of Food in the Last Year: Never true  Transportation Needs: No Transportation Needs (10/29/2023)   PRAPARE - Administrator, Civil Service (Medical): No    Lack of Transportation (Non-Medical): No  Physical Activity: Inactive (10/29/2023)   Exercise Vital Sign  Days of Exercise per Week: 0 days    Minutes of Exercise per Session: 0 min  Stress: No Stress Concern Present (10/29/2023)   [employer] of Occupational Health - Occupational Stress Questionnaire    Feeling of Stress : Not at all  Social Connections: Moderately Isolated (10/29/2023)   Social Connection and Isolation Panel    Frequency of Communication with Friends and Family: More than three times a week    Frequency of Social Gatherings with Friends and Family: More than three times a week    Attends Religious Services: Never    Database administrator or Organizations: No    Attends Engineer, structural: Never    Marital Status: Married     Family History:  The patient's family history includes Diabetes in her maternal grandfather and maternal uncle; Hashimoto's thyroiditis in her daughter and son; Heart attack (age of onset: 52) in her mother; Heart disease in her mother; Prostate cancer in her father.      ROS:   Please see the history of present illness.    ROS All other systems reviewed and are negative.   PHYSICAL EXAM:   VS:  BP (!) 138/58   Pulse (!) 59   Ht 5' (1.524 m)   Wt 218 lb 9.6 oz (99.2 kg)   SpO2 96%   BMI 42.69 kg/m    Affect appropriate Obese white female  HEENT: normal Neck supple with no adenopathy JVP normal no bruits no thyromegaly Lungs clear with no wheezing and good diaphragmatic motion Heart:   S1/S2 SEM  murmur, no rub, gallop or click PMI normal Abdomen: benighn, BS positve, no tenderness, no AAA no bruit.  No HSM or HJR Distal pulses intact with no bruits post RFA cut down for TAVR  Plus 2 bilateral edema Neuro non-focal Skin warm and dry Post bilateral TKR     Wt Readings from Last 3 Encounters:  02/14/24 218 lb 9.6 oz (99.2 kg)  10/29/23 220 lb (99.8 kg)  08/27/23 239 lb (108.4 kg)      Studies/Labs Reviewed:   EKG:  02/14/2024 SR rate  62 PR 228 otherwise normal   Recent Labs: 05/14/2023: ALT 20; Pro B Natriuretic peptide (BNP) 64.0 07/17/2023: BUN 15; Creatinine, Ser 0.67; Hemoglobin 13.0; Platelets 224.0; Potassium 4.2; Sodium 141   Lipid Panel    Component Value Date/Time   CHOL 163 07/17/2023 1402   TRIG 324.0 (H) 07/17/2023 1402   HDL 34.50 (L) 07/17/2023 1402   CHOLHDL 5 07/17/2023 1402   VLDL 64.8 (H) 07/17/2023 1402   LDLCALC 64 07/17/2023 1402   LDLDIRECT 89.0 04/02/2022 1442    Additional studies/ records that were reviewed today include:  04/23/17 Procedure:      Transcatheter Aortic Valve Replacement - Percutaneous Right Transfemoral Approach             Medtronic Evolut-Pro  (size 29 mm, model # Evolutpro29US, serial # [id number])   Co-Surgeons:            Bartley Lightning, MD and Arnoldo Lapping, MD     Anesthesiologist:                  Charmain Coons, MD   Echocardiographer:              Christoper Crafts, MD   Pre-operative Echo Findings: Severe aortic stenosis and moderate AI  Normal left ventricular systolic function   Post-operative Echo Findings: trivial paravalvular leak Normal left ventricular systolic  function Normal prosthetic transvalvular gradient of 8 mm Hg.   _____________   Echo  07/10/23  IMPRESSIONS     1. Left ventricular ejection fraction, by estimation, is 60 to 65%. The  left ventricle has normal function. The left ventricle has no regional  wall motion abnormalities. There is mild asymmetric left  ventricular  hypertrophy of the basal and septal  segments. Left ventricular diastolic parameters are indeterminate.   2. Right ventricular systolic function is normal. The right ventricular  size is normal.   3. Left atrial size was severely dilated.   4. Mean gradient across the valve in diastole 8 mmHg at HR 64 bpm with  MVA 2.1 cm2 . The mitral valve is abnormal. No evidence of mitral valve  regurgitation. No evidence of mitral stenosis. Severe mitral annular  calcification.   5. Post TAVR with supra annular 29 mm Medtronic Core Valve mean gradient  9 peak 16.6 mmHg with stable trivial PVL AVA 2.9 cm2 . The aortic valve  has been repaired/replaced. Aortic valve regurgitation is not visualized.  No aortic stenosis is present.  There is a 29 mm Medtronic CoreValve-Evolut Pro prosthetic (TAVR) valve  present in the aortic position. Procedure Date: 04/23/2017.   6. The inferior vena cava is normal in size with greater than 50%  respiratory variability, suggesting right atrial pressure of 3 mmHg    ASSESSMENT & PLAN:   Severe AS s/p TAVR:  NHYA class 1. Post Evolut Pro 29 mm Medtronic CoreValve stable gradients mean9 peak 16 mmHg AVA 2.9 cm2 no PVL by TTE 07/10/23 She does also have severe MAC and mild functional MS but stable   HTN: Well controlled.  Continue current medications and low sodium Dash type diet.    HLD: continue statin   CAD: continue asa, statin and BB circumflex lesion is very tight no angina and normal myovue  09/29/20 consider PET/CT next year No angina   LBBB: resolved now only with mild first degree AV block   DM:  Intolerant to mulitple meds including glucophage , SGLT2 with some cost prohibitive Seen by endocrine Gherge and A1c 6.6 so being Rx with diet only   Lymphedema:  continue lasix  and lymph pumps Low sodium diet improved   F/U in 6 months   Janelle Mediate

## 2024-02-14 ENCOUNTER — Ambulatory Visit: Payer: Medicare Other | Attending: Cardiovascular Disease | Admitting: Cardiovascular Disease

## 2024-02-14 VITALS — BP 138/58 | HR 59 | Ht 60.0 in | Wt 218.6 lb

## 2024-02-14 DIAGNOSIS — I10 Essential (primary) hypertension: Secondary | ICD-10-CM | POA: Insufficient documentation

## 2024-02-14 DIAGNOSIS — E782 Mixed hyperlipidemia: Secondary | ICD-10-CM | POA: Insufficient documentation

## 2024-02-14 DIAGNOSIS — Z952 Presence of prosthetic heart valve: Secondary | ICD-10-CM | POA: Diagnosis not present

## 2024-02-14 DIAGNOSIS — I350 Nonrheumatic aortic (valve) stenosis: Secondary | ICD-10-CM | POA: Insufficient documentation

## 2024-02-14 DIAGNOSIS — I447 Left bundle-branch block, unspecified: Secondary | ICD-10-CM | POA: Insufficient documentation

## 2024-02-14 DIAGNOSIS — I2510 Atherosclerotic heart disease of native coronary artery without angina pectoris: Secondary | ICD-10-CM | POA: Insufficient documentation

## 2024-02-14 NOTE — Patient Instructions (Addendum)

## 2024-03-12 ENCOUNTER — Other Ambulatory Visit: Payer: Self-pay

## 2024-03-12 MED ORDER — ATORVASTATIN CALCIUM 80 MG PO TABS: ORAL_TABLET | ORAL | 3 refills | Status: AC

## 2024-03-19 ENCOUNTER — Other Ambulatory Visit: Payer: Self-pay

## 2024-03-19 ENCOUNTER — Other Ambulatory Visit: Payer: Self-pay | Admitting: Family Medicine

## 2024-03-19 DIAGNOSIS — R600 Localized edema: Secondary | ICD-10-CM

## 2024-03-19 MED ORDER — METOPROLOL TARTRATE 25 MG PO TABS: 25.0000 mg | ORAL_TABLET | Freq: Two times a day (BID) | ORAL | 3 refills | Status: AC

## 2024-04-10 ENCOUNTER — Other Ambulatory Visit: Payer: Self-pay | Admitting: Family Medicine

## 2024-04-10 DIAGNOSIS — R600 Localized edema: Secondary | ICD-10-CM

## 2024-05-23 ENCOUNTER — Other Ambulatory Visit: Payer: Self-pay | Admitting: Family Medicine

## 2024-05-23 DIAGNOSIS — F341 Dysthymic disorder: Secondary | ICD-10-CM

## 2024-07-07 ENCOUNTER — Telehealth: Payer: Self-pay | Admitting: Family Medicine

## 2024-07-07 NOTE — Telephone Encounter (Signed)
 LMOM informing we were calling her back. Pt overdue to see Dr. Watt. E2C2 please schedule when she calls back.

## 2024-07-07 NOTE — Telephone Encounter (Signed)
 Copied from CRM #[id number]. Topic: Appointments - Appointment Scheduling >> Jul 07, 2024  2:00 PM Alfonso HERO wrote: Patient husband wants info about the RSV shot and would like a callback.

## 2024-07-27 DIAGNOSIS — Z23 Encounter for immunization: Secondary | ICD-10-CM | POA: Diagnosis not present

## 2024-08-03 ENCOUNTER — Ambulatory Visit: Payer: Self-pay

## 2024-08-03 NOTE — Telephone Encounter (Signed)
 Called her back- she does not wish to be seen hear her home, but is willing to see me on Wednesday at 11:40, got her scheduled.  She is not in distress but has large hives per her description.  I encouraged her to be seen sooner but she declines Allegra should be ok

## 2024-08-03 NOTE — Telephone Encounter (Signed)
 Please advise

## 2024-08-03 NOTE — Telephone Encounter (Signed)
 Patient did not want an appointment today or tomorrow--She wants an appointment Wednesday and already has an appointment Wednesday at 2pm with her Eye Doctor Patient wants a call back from the office [phone]  patient's cell phone to contact her   FYI Only or Action Required?: Action required by provider: clinical question for provider, update on patient condition, and wants to know if she can take Allegra with her previous cardiac history and wants an appointment Wednesday 12/3.  Patient was last seen in primary care on 07/17/2023 by Copland, Harlene BROCKS, MD.  Called Nurse Triage reporting Urticaria.  Symptoms began a week ago.  Interventions attempted: OTC medications: Tylenol .  Symptoms are: variable.  Triage Disposition: See Physician Within 24 Hours  Patient/caregiver understands and will follow disposition?: No, wishes to speak with PCP             Copied from CRM #[id number]. Topic: Clinical - Red Word Triage >> Aug 03, 2024 10:10 AM Charolett L wrote: [facility] that prompted transfer to Nurse Triage: Prentice Ned everywhere and when she scratches they bleed Reason for Disposition  [1] MODERATE-SEVERE widespread itching (i.e., interferes with sleep, normal activities or school) AND [2] not improved after 24 hours of itching Care Advice  Answer Assessment - Initial Assessment Questions [phone]  patient's cell phone to contact her  Hives come and go for the past week Started a week ago --itching and burning, states she has been scratching so bad she is bruising Taking Tylenol  Patient states that this happened 40 years ago Patient states that she hasn't changed anything external that she is aware of Denies fevers, nausea, vomiting She also states sometimes she will feel like she has a sore throat  Patient states that she is scratching her skin raw Used a mild unscented lotion and that did not help Patient states she has Allegra & patient wants to know if she can  take   Patient states hives come and go and they are worse at night Patient states she can't do cool baths because she doesn't like to be cold Patient has an appointment Wednesday at 2pm with her eye doctor 08/05/2024 at the [facility] on [address] Patient wants an appointment Wednesday so her and her husband dont have to take multiple trips Patient did not want an appointment today or tomorrow---offered patient an appointment today but she just wants to wait until Wednesday. She states when the hives go away she feels fine and they are random with unknown triggers  Patient is advised to call us  back if anything changes or with any further questions/concerns. Patient is advised that if anything worsens to go to the Emergency Room or call 911. Patient verbalized understanding.  Protocols used: Itching - Widespread-A-AH

## 2024-08-04 NOTE — Progress Notes (Unsigned)
 [facility] [address] [place], [place] [address] [phone]  Date:  08/05/2024   Name:  Debra Knight   DOB:  Aug 15, 1946   MRN:  [id number]  PCP:  Watt Harlene BROCKS, MD    Chief Complaint: Urticaria (Onset - 10 days ago - not sure of the cause - hives are everywhere other than face/Taking OTC Allegra)   History of Present Illness:  Debra Knight is a 78 y.o. very pleasant female patient who presents with the following:  Patient seen today with concern of hives and itching.  She contacted us  on Monday of this week and  scheduled this appointment.  Today is Wednesday  Otherwise I saw her most recently about a year ago History of DM, CAD, HTN, hyperlipidemia, OSA, AS s/p TAVR   She was seen by cardiology in June Her diabetes is managed by endocrinology although I do not see a recent note and last A1c on chart is from a year ago  Likely need to update labs, A1c, GFR, urine micro She is up-to-date on her flu shot and pneumonia vaccination  Lab Results  Component Value Date   HGBA1C 6.9 (H) 07/17/2023   Discussed the use of AI scribe software for clinical note transcription with the patient, who gave verbal consent to proceed.  History of Present Illness [name] is a 78 year old female who presents with persistent itching and hives.  She experiences excruciating itching affecting her entire back, down the back of her legs to her knees, and her scalp, leading to excoriation from scratching. She started taking Allegra on Monday, with two doses so far, which has helped alleviate the hives but not completely resolved the itching.  No new medications or known triggers for the hives. She recalls a similar episode approximately 40 years ago, lasting nine months, which was said at the time to be due to an autoimmune reaction to a borderline undiagnosed case of hepatitis after a trip to [place]. At that  time, it resolved on its own without specific treatment for hepatitis.?  Hepatitis A  No swelling of the mouth, lips, or tongue, but sometimes a sensation in her throat when swallowing. Itching also occurs behind her ears. No history of asthma or allergies as a child, but frequent bronchitis.  Current medications include Allegra for the hives and furosemide  for an unspecified condition. She manages her diabetes through diet and does not take any diabetes medications. Concerned about kidney function due to long-term furosemide  use.  In the review of symptoms, no new symptoms or changes in her condition, except for slight nausea and persistent itching. No jaundice noted.    Patient Active Problem List   Diagnosis Date Noted   Incontinence of feces    Lung nodule 08/26/2018   PCP NOTE >>>>>>>>>>>>>>>> 06/04/2018   Poorly controlled type 2 diabetes mellitus with circulatory disorder (HCC) 06/04/2018   Aortic stenosis, severe    Carotid artery disease    CAD (coronary artery disease)    Arthritis of knee, degenerative 12/13/2014   Osteoarthritis of both knees    Abnormal thyroid  exam    HTN (hypertension) 09/11/2013   Hyperlipidemia    OSA (obstructive sleep apnea) 04/09/2013    Past Medical History:  Diagnosis Date   Aortic stenosis, severe    a. 04/2017: s/p TAVR wtih a Medtronic Evolut-Pro  (size 29 mm, model # Evolutpro29US, serial # [id number])  CAD (coronary artery disease)    a. pre-TAVR cath 01/2017 showed 80% LCX stenosis. No angina. Plan to treat medically   Enlarged thyroid     GERD (gastroesophageal reflux disease)    Hyperlipidemia    OSA (obstructive sleep apnea)    no cpap    Osteoarthritis of both knees    Tinnitus     Past Surgical History:  Procedure Laterality Date   ANAL RECTAL MANOMETRY N/A 05/03/2021   Procedure: ANO RECTAL MANOMETRY;  Surgeon: Nandigam, Kavitha V, MD;  Location: [facility];  Service: Endoscopy;  Laterality: N/A;   BIOPSY THYROID   2015    DILATION AND CURETTAGE OF UTERUS     LEFT HEART CATH AND CORONARY ANGIOGRAPHY N/A 01/15/2017   Procedure: Left Heart Cath and Coronary Angiography;  Surgeon: Wonda Sharper, MD;  Location: [facility] CV [facility];  Service: Cardiovascular;  Laterality: N/A;   TEE WITHOUT CARDIOVERSION N/A 04/23/2017   Procedure: TRANSESOPHAGEAL ECHOCARDIOGRAM (TEE);  Surgeon: Wonda Sharper, MD;  Location: [facility] OR;  Service: Open Heart Surgery;  Laterality: N/A;   TONSILLECTOMY  age 79   TOTAL KNEE ARTHROPLASTY Right 12/13/2014   Procedure: RIGHT TOTAL KNEE ARTHROPLASTY;  Surgeon: Dempsey Moan, MD;  Location: [facility] ORS;  Service: Orthopedics;  Laterality: Right;   TOTAL KNEE ARTHROPLASTY Left 02/20/2016   Procedure: LEFT TOTAL KNEE ARTHROPLASTY;  Surgeon: Dempsey Moan, MD;  Location: [facility];  Service: Orthopedics;  Laterality: Left;   TRANSCATHETER AORTIC VALVE REPLACEMENT, TRANSFEMORAL N/A 04/23/2017   Procedure: TRANSCATHETER AORTIC VALVE REPLACEMENT, TRANSFEMORAL;  Surgeon: Wonda Sharper, MD;  [facility];  Service: Open Heart Surgery;  Laterality: N/A;  MEDTRONIC VALVE    Social History   Tobacco Use   Smoking status: Former    Current packs/day: 0.00    Average packs/day: 2.0 packs/day for 15.0 years (30.0 ttl pk-yrs)    Types: Cigarettes    Start date: 09/03/1953    Quit date: 09/03/1968    Years since quitting: 55.9   Smokeless tobacco: Never   Tobacco comments:    1-2 ppd. , NONE IN 40 YEARS  Vaping Use   Vaping status: Never Used  Substance Use Topics   Alcohol use: No   Drug use: No    Family History  Problem Relation Age of Onset   Heart disease Mother    Heart attack Mother 17   Prostate cancer Father    Diabetes Maternal Grandfather    Diabetes Maternal Uncle    Hashimoto's thyroiditis Daughter    Hashimoto's thyroiditis Son    Breast cancer Neg Hx     Allergies  Allergen Reactions   Advil [Ibuprofen] Anaphylaxis, Swelling and Other (See Comments)    Throat swelling per patient    Penicillins Hives, Swelling and Other (See Comments)    Tongue swelling Has patient had a PCN reaction causing immediate rash, facial/tongue/throat swelling, SOB or lightheadedness with hypotension: yes Has patient had a PCN reaction causing severe rash involving mucus membranes or skin necrosis: no Has patient had a PCN reaction that required hospitalization no Has patient had a PCN reaction occurring within the last 10 years: no If all of the above answers are NO, then may proceed with Cepha   Demerol [Meperidine] Nausea And Vomiting   Other Hives and Other (See Comments)    Duck   Cortisone Anxiety and Other (See Comments)    Loopy     Medication list has been reviewed and updated.  Current Outpatient Medications on File Prior to Visit  Medication Sig Dispense Refill   Ascorbic Acid (VITAMIN C) 1000 MG tablet Take 1,000 mg by mouth daily.     nitroGLYCERIN  (NITROSTAT ) 0.4 MG SL tablet Place 1 tablet (0.4 mg total) under the tongue every 5 (five) minutes as needed for chest pain. 25 tablet 3   aspirin  EC 81 MG tablet Take 81 mg by mouth every evening.     atorvastatin  (LIPITOR) 80 MG tablet TAKE 1 TABLET(80 MG) BY MOUTH AT BEDTIME. 90 tablet 3   clindamycin  (CLEOCIN ) 300 MG capsule Take 2 capsules (600 mg total) by mouth as directed. Take 2 tablets by mouth 60 minutes prior to dental appointments 2 capsule 3   furosemide  (LASIX ) 20 MG tablet Take 1 tablet (20 mg total) by mouth daily. Needs appt 30 tablet 0   metoprolol  tartrate (LOPRESSOR ) 25 MG tablet Take 1 tablet (25 mg total) by mouth 2 (two) times daily. 180 tablet 3   Multiple Vitamin (MULTIVITAMIN WITH MINERALS) TABS tablet Take 1 tablet by mouth daily.     venlafaxine  XR (EFFEXOR -XR) 37.5 MG 24 hr capsule TAKE 1 CAPSULE BY MOUTH DAILY WITH BREAKFAST. 90 capsule 2   No current facility-administered medications on file prior to visit.    Review of Systems:  As per HPI- otherwise negative.   Physical  Examination: Vitals:   08/05/24 1118  BP: 118/60  Pulse: 61  Temp: 98.1 F (36.7 C)  SpO2: 99%   Vitals:   08/05/24 1118  Weight: 230 lb 6.4 oz (104.5 kg)  Height: 5' (1.524 m)   Body mass index is 45 kg/m. Ideal Body Weight: Weight in (lb) to have BMI = 25: 127.7  GEN: no acute distress.  Obese, looks well HEENT: Atraumatic, Normocephalic.  Bilateral TM wnl, oropharynx normal.  PEERL,EOMI. no angioedema Ears and Nose: No external deformity. CV: RRR, No M/G/R. No JVD. No thrill. No extra heart sounds. PULM: CTA B, no wheezes, crackles, rhonchi. No retractions. No resp. distress. No accessory muscle use. EXTR: No c/c/e PSYCH: Normally interactive. Conversant.  She has excoriations on her hands and feet, some excoriations and also bruises on her back from where she has scratched.  No active urticaria or other rash  Assessment and Plan: Other hyperlipidemia  Coronary artery disease without angina pectoris, unspecified vessel or lesion type, unspecified whether native or transplanted heart  Poorly controlled type 2 diabetes mellitus with circulatory disorder (HCC)  Urticaria  History of hepatitis  Alkaline phosphatase elevation  Itching  Assessment & Plan Urticaria and pruritus Chronic urticaria and pruritus with significant itching and excoriation. No new triggers identified. Current management with Allegra effective. Prednisone not preferred due to past adverse effects. - Ordered blood work including A1c, kidney function, blood count, metabolic profile, blood sugar, cholesterol, thyroid , hepatitis panel, and sedimentation rate. - Continue Allegra as needed for symptom control. - Consider calamine lotion or oatmeal baths for soothing skin irritation. She does not wish to use steroids  Type 2 diabetes mellitus Currently diet-controlled with no diabetes medications. - Ordered A1c to assess current diabetes control.  General health maintenance Routine health  maintenance discussed, including kidney function monitoring due to long-term furosemide  use. - Ordered kidney function tests. She continues to take atorvastatin  and aspirin , has nitroglycerin  available  Signed Harlene Schroeder, MD  Addendum 12/4, received labs as below.  Message to patient Results for orders placed or performed in visit on 08/05/24  CBC   Collection Time: 08/05/24 11:58 AM  Result Value Ref Range   WBC 5.8  4.0 - 10.5 K/uL   RBC 4.00 3.87 - 5.11 Mil/uL   Platelets 238.0 150.0 - 400.0 K/uL   Hemoglobin 12.2 12.0 - 15.0 g/dL   HCT 63.4 63.9 - 53.9 %   MCV 91.3 78.0 - 100.0 fl   MCHC 33.4 30.0 - 36.0 g/dL   RDW 85.3 88.4 - 84.4 %  Comprehensive metabolic panel with GFR   Collection Time: 08/05/24 11:58 AM  Result Value Ref Range   Sodium 142 135 - 145 mEq/L   Potassium 4.7 3.5 - 5.1 mEq/L   Chloride 102 96 - 112 mEq/L   CO2 30 19 - 32 mEq/L   Glucose, Bld 119 (H) 70 - 99 mg/dL   BUN 10 6 - 23 mg/dL   Creatinine, Ser 9.42 0.40 - 1.20 mg/dL   Total Bilirubin 0.6 0.2 - 1.2 mg/dL   Alkaline Phosphatase 100 39 - 117 U/L   AST 19 0 - 37 U/L   ALT 20 0 - 35 U/L   Total Protein 6.1 6.0 - 8.3 g/dL   Albumin  3.9 3.5 - 5.2 g/dL   GFR 13.59 >39.99 mL/min   Calcium  8.7 8.4 - 10.5 mg/dL  Hemoglobin J8r   Collection Time: 08/05/24 11:58 AM  Result Value Ref Range   Hgb A1c MFr Bld 6.5 4.6 - 6.5 %  Lipid panel   Collection Time: 08/05/24 11:58 AM  Result Value Ref Range   Cholesterol 154 0 - 200 mg/dL   Triglycerides 768.9 (H) 0.0 - 149.0 mg/dL   HDL 58.19 >60.99 mg/dL   VLDL 53.7 (H) 0.0 - 59.9 mg/dL   LDL Cholesterol 66 0 - 99 mg/dL   Total CHOL/HDL Ratio 4    NonHDL 112.59   TSH   Collection Time: 08/05/24 11:58 AM  Result Value Ref Range   TSH 2.12 0.35 - 5.50 uIU/mL  Sedimentation rate   Collection Time: 08/05/24 11:58 AM  Result Value Ref Range   Sed Rate 15 0 - 30 mm/hr  Hepatitis, Acute   Collection Time: 08/05/24 11:58 AM  Result Value Ref Range    Hep A IgM NON-REACTIVE NON-REACTIVE   Hepatitis B Surface Ag NON-REACTIVE NON-REACTIVE   Hep B C IgM NON-REACTIVE NON-REACTIVE   Hepatitis C Ab NON-REACTIVE NON-REACTIVE

## 2024-08-05 ENCOUNTER — Ambulatory Visit: Admitting: Family Medicine

## 2024-08-05 VITALS — BP 118/60 | HR 61 | Temp 98.1°F | Ht 60.0 in | Wt 230.4 lb

## 2024-08-05 DIAGNOSIS — R748 Abnormal levels of other serum enzymes: Secondary | ICD-10-CM | POA: Diagnosis not present

## 2024-08-05 DIAGNOSIS — L509 Urticaria, unspecified: Secondary | ICD-10-CM

## 2024-08-05 DIAGNOSIS — H35363 Drusen (degenerative) of macula, bilateral: Secondary | ICD-10-CM | POA: Diagnosis not present

## 2024-08-05 DIAGNOSIS — E119 Type 2 diabetes mellitus without complications: Secondary | ICD-10-CM | POA: Diagnosis not present

## 2024-08-05 DIAGNOSIS — H35372 Puckering of macula, left eye: Secondary | ICD-10-CM | POA: Diagnosis not present

## 2024-08-05 DIAGNOSIS — Z8619 Personal history of other infectious and parasitic diseases: Secondary | ICD-10-CM | POA: Diagnosis not present

## 2024-08-05 DIAGNOSIS — E1165 Type 2 diabetes mellitus with hyperglycemia: Secondary | ICD-10-CM

## 2024-08-05 DIAGNOSIS — E1159 Type 2 diabetes mellitus with other circulatory complications: Secondary | ICD-10-CM | POA: Diagnosis not present

## 2024-08-05 DIAGNOSIS — E7849 Other hyperlipidemia: Secondary | ICD-10-CM | POA: Diagnosis not present

## 2024-08-05 DIAGNOSIS — H353131 Nonexudative age-related macular degeneration, bilateral, early dry stage: Secondary | ICD-10-CM | POA: Diagnosis not present

## 2024-08-05 DIAGNOSIS — R7401 Elevation of levels of liver transaminase levels: Secondary | ICD-10-CM | POA: Diagnosis not present

## 2024-08-05 DIAGNOSIS — L299 Pruritus, unspecified: Secondary | ICD-10-CM | POA: Diagnosis not present

## 2024-08-05 DIAGNOSIS — I2510 Atherosclerotic heart disease of native coronary artery without angina pectoris: Secondary | ICD-10-CM | POA: Diagnosis not present

## 2024-08-05 DIAGNOSIS — D3131 Benign neoplasm of right choroid: Secondary | ICD-10-CM | POA: Diagnosis not present

## 2024-08-05 LAB — HEPATITIS PANEL, ACUTE
Hep A IgM: NONREACTIVE
Hep B C IgM: NONREACTIVE
Hepatitis B Surface Ag: NONREACTIVE
Hepatitis C Ab: NONREACTIVE

## 2024-08-06 ENCOUNTER — Encounter: Payer: Self-pay | Admitting: Family Medicine

## 2024-08-06 LAB — LIPID PANEL
Cholesterol: 154 mg/dL (ref 0–200)
HDL: 41.8 mg/dL (ref >39.00)
LDL Cholesterol: 66 mg/dL (ref 0–99)
NonHDL: 112.59
Total CHOL/HDL Ratio: 4
Triglycerides: 231 mg/dL — ABNORMAL HIGH (ref 0.0–149.0)
VLDL: 46.2 mg/dL — ABNORMAL HIGH (ref 0.0–40.0)

## 2024-08-06 LAB — HEMOGLOBIN A1C: Hgb A1c MFr Bld: 6.5 % (ref 4.6–6.5)

## 2024-08-06 LAB — COMPREHENSIVE METABOLIC PANEL WITH GFR
ALT: 20 U/L (ref 0–35)
AST: 19 U/L (ref 0–37)
Albumin: 3.9 g/dL (ref 3.5–5.2)
Alkaline Phosphatase: 100 U/L (ref 39–117)
BUN: 10 mg/dL (ref 6–23)
CO2: 30 meq/L (ref 19–32)
Calcium: 8.7 mg/dL (ref 8.4–10.5)
Chloride: 102 meq/L (ref 96–112)
Creatinine, Ser: 0.57 mg/dL (ref 0.40–1.20)
GFR: 86.4 mL/min (ref >60.00)
Glucose, Bld: 119 mg/dL — ABNORMAL HIGH (ref 70–99)
Potassium: 4.7 meq/L (ref 3.5–5.1)
Sodium: 142 meq/L (ref 135–145)
Total Bilirubin: 0.6 mg/dL (ref 0.2–1.2)
Total Protein: 6.1 g/dL (ref 6.0–8.3)

## 2024-08-06 LAB — TSH: TSH: 2.12 u[IU]/mL (ref 0.35–5.50)

## 2024-08-06 LAB — SEDIMENTATION RATE: Sed Rate: 15 mm/h (ref 0–30)

## 2024-08-06 LAB — CBC
HCT: 36.5 % (ref 36.0–46.0)
Hemoglobin: 12.2 g/dL (ref 12.0–15.0)
MCHC: 33.4 g/dL (ref 30.0–36.0)
MCV: 91.3 fl (ref 78.0–100.0)
Platelets: 238 K/uL (ref 150.0–400.0)
RBC: 4 Mil/uL (ref 3.87–5.11)
RDW: 14.6 % (ref 11.5–15.5)
WBC: 5.8 K/uL (ref 4.0–10.5)

## 2024-08-17 ENCOUNTER — Encounter: Payer: Self-pay | Admitting: Cardiovascular Disease

## 2024-08-18 NOTE — Telephone Encounter (Signed)
 Only allergist I know in town are with [facility] can use my name to refer to them Dr Frutoso has been around for a while and if I'm not mistaken they have a new doctor that just started Dr Comer Serene Maude Delford 08/17/24  3:17 PM

## 2024-11-03 ENCOUNTER — Ambulatory Visit: Payer: Medicare Other
